# Patient Record
Sex: Male | Born: 1940 | Race: Black or African American | Hispanic: No | Smoking: Never smoker
Health system: Southern US, Community
[De-identification: ages and names within clinical notes are randomized; demographics above are authoritative.]

## PROBLEM LIST (undated history)

## (undated) DIAGNOSIS — I509 Heart failure, unspecified: Secondary | ICD-10-CM

## (undated) DIAGNOSIS — J189 Pneumonia, unspecified organism: Secondary | ICD-10-CM

## (undated) DIAGNOSIS — I639 Cerebral infarction, unspecified: Secondary | ICD-10-CM

## (undated) DIAGNOSIS — N390 Urinary tract infection, site not specified: Secondary | ICD-10-CM

## (undated) DIAGNOSIS — F0390 Unspecified dementia without behavioral disturbance: Secondary | ICD-10-CM

## (undated) DIAGNOSIS — E785 Hyperlipidemia, unspecified: Secondary | ICD-10-CM

## (undated) DIAGNOSIS — I10 Essential (primary) hypertension: Secondary | ICD-10-CM

## (undated) DIAGNOSIS — M6282 Rhabdomyolysis: Secondary | ICD-10-CM

## (undated) DIAGNOSIS — D472 Monoclonal gammopathy: Secondary | ICD-10-CM

## (undated) DIAGNOSIS — E049 Nontoxic goiter, unspecified: Secondary | ICD-10-CM

## (undated) DIAGNOSIS — I350 Nonrheumatic aortic (valve) stenosis: Secondary | ICD-10-CM

## (undated) HISTORY — PX: CIRCUMCISION: SUR203

---

## 2000-05-10 ENCOUNTER — Encounter: Payer: Self-pay | Admitting: Emergency Medicine

## 2000-05-10 ENCOUNTER — Encounter: Payer: Self-pay | Admitting: Family Medicine

## 2000-05-10 ENCOUNTER — Inpatient Hospital Stay (HOSPITAL_COMMUNITY): Admission: EM | Admit: 2000-05-10 | Discharge: 2000-05-18 | Payer: Self-pay | Admitting: Emergency Medicine

## 2000-05-11 ENCOUNTER — Encounter: Payer: Self-pay | Admitting: Pediatrics

## 2000-05-12 ENCOUNTER — Encounter: Payer: Self-pay | Admitting: Pediatrics

## 2000-05-18 ENCOUNTER — Inpatient Hospital Stay (HOSPITAL_COMMUNITY)
Admission: RE | Admit: 2000-05-18 | Discharge: 2000-06-02 | Payer: Self-pay | Admitting: Physical Medicine and Rehabilitation

## 2000-07-09 ENCOUNTER — Encounter: Admission: RE | Admit: 2000-07-09 | Discharge: 2000-08-25 | Payer: Self-pay | Admitting: Pediatrics

## 2000-09-25 ENCOUNTER — Encounter: Payer: Self-pay | Admitting: Emergency Medicine

## 2000-09-25 ENCOUNTER — Encounter: Admission: RE | Admit: 2000-09-25 | Discharge: 2000-09-25 | Payer: Self-pay | Admitting: Emergency Medicine

## 2008-05-19 ENCOUNTER — Encounter: Admission: RE | Admit: 2008-05-19 | Discharge: 2008-05-19 | Payer: Self-pay | Admitting: Nephrology

## 2011-02-26 ENCOUNTER — Emergency Department (HOSPITAL_COMMUNITY): Payer: Medicare Other

## 2011-02-26 ENCOUNTER — Inpatient Hospital Stay (HOSPITAL_COMMUNITY)
Admission: EM | Admit: 2011-02-26 | Discharge: 2011-03-01 | DRG: 558 | Disposition: A | Discharge status: Home / Self Care | Payer: Medicare Other | Attending: Internal Medicine | Admitting: Internal Medicine

## 2011-02-26 DIAGNOSIS — M6282 Rhabdomyolysis: Principal | ICD-10-CM | POA: Diagnosis present

## 2011-02-26 DIAGNOSIS — I359 Nonrheumatic aortic valve disorder, unspecified: Secondary | ICD-10-CM | POA: Diagnosis present

## 2011-02-26 DIAGNOSIS — W010XXA Fall on same level from slipping, tripping and stumbling without subsequent striking against object, initial encounter: Secondary | ICD-10-CM | POA: Diagnosis present

## 2011-02-26 DIAGNOSIS — R748 Abnormal levels of other serum enzymes: Secondary | ICD-10-CM | POA: Diagnosis present

## 2011-02-26 DIAGNOSIS — I10 Essential (primary) hypertension: Secondary | ICD-10-CM | POA: Diagnosis present

## 2011-02-26 DIAGNOSIS — R310 Gross hematuria: Secondary | ICD-10-CM | POA: Diagnosis not present

## 2011-02-26 DIAGNOSIS — D472 Monoclonal gammopathy: Secondary | ICD-10-CM | POA: Diagnosis present

## 2011-02-26 DIAGNOSIS — Z8673 Personal history of transient ischemic attack (TIA), and cerebral infarction without residual deficits: Secondary | ICD-10-CM

## 2011-02-26 DIAGNOSIS — I5030 Unspecified diastolic (congestive) heart failure: Secondary | ICD-10-CM | POA: Diagnosis present

## 2011-02-26 DIAGNOSIS — N390 Urinary tract infection, site not specified: Secondary | ICD-10-CM | POA: Diagnosis present

## 2011-02-26 DIAGNOSIS — R42 Dizziness and giddiness: Secondary | ICD-10-CM | POA: Diagnosis present

## 2011-02-26 DIAGNOSIS — I951 Orthostatic hypotension: Secondary | ICD-10-CM | POA: Diagnosis present

## 2011-02-26 DIAGNOSIS — Y92009 Unspecified place in unspecified non-institutional (private) residence as the place of occurrence of the external cause: Secondary | ICD-10-CM

## 2011-02-26 LAB — CBC
MCH: 28.4 pg (ref 26.0–34.0)
MCV: 87.9 fL (ref 78.0–100.0)
Platelets: 185 10*3/uL (ref 150–400)
RBC: 5.36 MIL/uL (ref 4.22–5.81)
RDW: 13.4 % (ref 11.5–15.5)
WBC: 7.2 10*3/uL (ref 4.0–10.5)

## 2011-02-26 LAB — DIFFERENTIAL
Basophils Relative: 0 % (ref 0–1)
Eosinophils Absolute: 0 10*3/uL (ref 0.0–0.7)
Eosinophils Relative: 1 % (ref 0–5)
Lymphs Abs: 1 10*3/uL (ref 0.7–4.0)
Neutrophils Relative %: 77 % (ref 43–77)

## 2011-02-26 LAB — BASIC METABOLIC PANEL
BUN: 31 mg/dL — ABNORMAL HIGH (ref 6–23)
Creatinine, Ser: 1.29 mg/dL (ref 0.4–1.5)
GFR calc Af Amer: >60 mL/min (ref >60)
GFR calc non Af Amer: 55 mL/min — ABNORMAL LOW (ref >60)
Potassium: 3.8 mEq/L (ref 3.5–5.1)

## 2011-02-26 LAB — URINALYSIS, ROUTINE W REFLEX MICROSCOPIC
Bilirubin Urine: NEGATIVE
Protein, ur: >300 mg/dL — AB
Urine Glucose, Fasting: NEGATIVE mg/dL
pH: 8 (ref 5.0–8.0)

## 2011-02-26 LAB — CK TOTAL AND CKMB (NOT AT ARMC)
CK, MB: 6.9 ng/mL (ref 0.3–4.0)
Total CK: 3836 U/L — ABNORMAL HIGH (ref 7–232)

## 2011-02-26 LAB — URINE MICROSCOPIC-ADD ON

## 2011-02-26 LAB — D-DIMER, QUANTITATIVE: D-Dimer, Quant: 2.62 ug/mL-FEU — ABNORMAL HIGH (ref 0.00–0.48)

## 2011-02-27 ENCOUNTER — Encounter (HOSPITAL_COMMUNITY): Payer: Self-pay

## 2011-02-27 DIAGNOSIS — R079 Chest pain, unspecified: Secondary | ICD-10-CM

## 2011-02-27 LAB — DIFFERENTIAL
Basophils Absolute: 0 10*3/uL (ref 0.0–0.1)
Basophils Relative: 0 % (ref 0–1)
Eosinophils Absolute: 0 10*3/uL (ref 0.0–0.7)
Eosinophils Relative: 0 % (ref 0–5)
Lymphs Abs: 1.2 10*3/uL (ref 0.7–4.0)

## 2011-02-27 LAB — CARDIAC PANEL(CRET KIN+CKTOT+MB+TROPI)
CK, MB: 4.1 ng/mL — ABNORMAL HIGH (ref 0.3–4.0)
Total CK: 3057 U/L — ABNORMAL HIGH (ref 7–232)

## 2011-02-27 LAB — COMPREHENSIVE METABOLIC PANEL
Albumin: 3.5 g/dL (ref 3.5–5.2)
BUN: 34 mg/dL — ABNORMAL HIGH (ref 6–23)
Chloride: 110 mEq/L (ref 96–112)
Creatinine, Ser: 1.3 mg/dL (ref 0.4–1.5)
GFR calc non Af Amer: 55 mL/min — ABNORMAL LOW (ref >60)
Total Bilirubin: 0.9 mg/dL (ref 0.3–1.2)

## 2011-02-27 LAB — CBC
MCV: 87.9 fL (ref 78.0–100.0)
Platelets: 166 10*3/uL (ref 150–400)
RDW: 13.6 % (ref 11.5–15.5)
WBC: 5.6 10*3/uL (ref 4.0–10.5)

## 2011-02-27 LAB — PROTIME-INR
INR: 1.07 (ref 0.00–1.49)
Prothrombin Time: 14.1 seconds (ref 11.6–15.2)
Prothrombin Time: 15.5 seconds — ABNORMAL HIGH (ref 11.6–15.2)

## 2011-02-27 LAB — RAPID URINE DRUG SCREEN, HOSP PERFORMED
Amphetamines: NOT DETECTED
Benzodiazepines: NOT DETECTED
Cocaine: NOT DETECTED
Opiates: NOT DETECTED
Tetrahydrocannabinol: NOT DETECTED

## 2011-02-27 LAB — APTT
aPTT: 25 seconds (ref 24–37)
aPTT: 30 seconds (ref 24–37)

## 2011-02-27 MED ORDER — IOHEXOL 300 MG/ML  SOLN
100.0000 mL | Freq: Once | INTRAMUSCULAR | Status: AC | PRN
  Administered 2011-02-27: 100 mL via INTRAVENOUS

## 2011-02-28 ENCOUNTER — Inpatient Hospital Stay (HOSPITAL_COMMUNITY): Payer: Medicare Other

## 2011-02-28 DIAGNOSIS — I517 Cardiomegaly: Secondary | ICD-10-CM

## 2011-02-28 DIAGNOSIS — R42 Dizziness and giddiness: Secondary | ICD-10-CM

## 2011-02-28 LAB — LIPID PANEL
Cholesterol: 134 mg/dL (ref 0–200)
HDL: 43 mg/dL (ref >39)
Total CHOL/HDL Ratio: 3.1 RATIO
Triglycerides: 52 mg/dL (ref <150)

## 2011-02-28 LAB — CBC
Hemoglobin: 11.6 g/dL — ABNORMAL LOW (ref 13.0–17.0)
Hemoglobin: 12.4 g/dL — ABNORMAL LOW (ref 13.0–17.0)
MCH: 28 pg (ref 26.0–34.0)
MCH: 28.8 pg (ref 26.0–34.0)
MCHC: 32.5 g/dL (ref 30.0–36.0)
MCV: 88 fL (ref 78.0–100.0)
MCV: 88.6 fL (ref 78.0–100.0)
RBC: 4.15 MIL/uL — ABNORMAL LOW (ref 4.22–5.81)
RBC: 4.3 MIL/uL (ref 4.22–5.81)

## 2011-02-28 LAB — URINALYSIS, ROUTINE W REFLEX MICROSCOPIC
Bilirubin Urine: NEGATIVE
Glucose, UA: NEGATIVE mg/dL
Specific Gravity, Urine: 1.023 (ref 1.005–1.030)
pH: 6 (ref 5.0–8.0)

## 2011-02-28 LAB — COMPREHENSIVE METABOLIC PANEL
BUN: 26 mg/dL — ABNORMAL HIGH (ref 6–23)
CO2: 26 mEq/L (ref 19–32)
Chloride: 106 mEq/L (ref 96–112)
Creatinine, Ser: 1.09 mg/dL (ref 0.4–1.5)
GFR calc non Af Amer: >60 mL/min (ref >60)
Total Bilirubin: 0.8 mg/dL (ref 0.3–1.2)

## 2011-02-28 LAB — URINE MICROSCOPIC-ADD ON

## 2011-02-28 LAB — TSH: TSH: 0.991 u[IU]/mL (ref 0.350–4.500)

## 2011-02-28 LAB — PROTIME-INR: Prothrombin Time: 14.6 seconds (ref 11.6–15.2)

## 2011-03-01 DIAGNOSIS — R55 Syncope and collapse: Secondary | ICD-10-CM

## 2011-03-01 LAB — BASIC METABOLIC PANEL
BUN: 19 mg/dL (ref 6–23)
Chloride: 105 mEq/L (ref 96–112)
GFR calc Af Amer: >60 mL/min (ref >60)
GFR calc non Af Amer: >60 mL/min (ref >60)
Potassium: 3.7 mEq/L (ref 3.5–5.1)
Sodium: 137 mEq/L (ref 135–145)

## 2011-03-01 LAB — CK: Total CK: 2304 U/L — ABNORMAL HIGH (ref 7–232)

## 2011-03-12 ENCOUNTER — Telehealth (INDEPENDENT_AMBULATORY_CARE_PROVIDER_SITE_OTHER): Payer: Self-pay | Admitting: *Deleted

## 2011-03-13 ENCOUNTER — Ambulatory Visit (HOSPITAL_COMMUNITY): Payer: Medicare Other | Attending: Internal Medicine

## 2011-03-13 ENCOUNTER — Encounter: Payer: Self-pay | Admitting: Internal Medicine

## 2011-03-13 DIAGNOSIS — I4949 Other premature depolarization: Secondary | ICD-10-CM

## 2011-03-13 DIAGNOSIS — R55 Syncope and collapse: Secondary | ICD-10-CM | POA: Insufficient documentation

## 2011-03-18 NOTE — Assessment & Plan Note (Signed)
Summary: Cardiology Nuclear Testing  Nuclear Med Background Indications for Stress Test: Evaluation for Ischemia, Post Hospital  Indications Comments: 03/01/11 CP: ^ troponin, syncope  History: Echo  History Comments: 3/12 Echo: EF= 60-65%, mod AS, mod LVH  Symptoms: Chest Pain with Exertion, Dizziness, DOE, Palpitations, Syncope    Nuclear Pre-Procedure Cardiac Risk Factors: CVA, Hypertension Caffeine/Decaff Intake: None NPO After: 5:00 PM Lungs: clear IV 0.9% NS with Angio Cath: 22g     IV Site: R Wrist IV Started by: Bonnita Levan, RN Chest Size (in) 44     Height (in): 65 Weight (lb): 154 BMI: 25.72  Nuclear Med Study 1 or 2 day study:  1 day     Stress Test Type:  Lexiscan Reading MD:  Dietrich Pates, MD     Referring MD:  T.Brackbill Resting Radionuclide:  Technetium 1m Tetrofosmin     Resting Radionuclide Dose:  10.5 mCi  Stress Radionuclide:  Technetium 66m Tetrofosmin     Stress Radionuclide Dose:  33 mCi   Stress Protocol  Max Systolic BP: 177 mm Hg Lexiscan: 0.4 mg   Stress Test Technologist:  Milana Na, EMT-P     Nuclear Technologist:  Doyne Keel, CNMT  Rest Procedure  Myocardial perfusion imaging was performed at rest 45 minutes following the intravenous administration of Technetium 67m Tetrofosmin.  Stress Procedure  The patient received IV Lexiscan 0.4 mg over 15-seconds.  Technetium 35m Tetrofosmin injected at 30-seconds.  There were no significant changes, and freq pvcs with infusion.  Quantitative spect images were obtained after a 45 minute delay.  QPS Raw Data Images:  Stress images were moton corrected.  SOft tissue (diaphragm, bowel activity) underlie heart. Stress Images:  Normal homogeneous uptake in all areas of the myocardium. Rest Images:  Normal homogeneous uptake in all areas of the myocardium. Subtraction (SDS):  No evidence of ischemia. Transient Ischemic Dilatation:  1.04  (Normal <1.22)  Lung/Heart Ratio:  .33  (Normal  <0.45)  Quantitative Gated Spect Images QGS cine images:  non-gated study   Overall Impression  Exercise Capacity: Lexiscan with no exercise. BP Response: Normal blood pressure response. Clinical Symptoms: No chest pain ECG Impression: Isolated PVCs through study.  EKG was nondiagnostic due to baseline changes. Overall Impression: Normal stress nuclear study.

## 2011-03-18 NOTE — Progress Notes (Signed)
Summary: Nuclear Pre-Procedure  Phone Note Outgoing Call Call back at [facility] Phone [phone]   Call placed by: Stanton Kidney, EMT-P,  March 12, 2011 2:15 PM Action Taken: Phone Call Completed Summary of Call: Left message with information on Myoview Information Sheet (see scanned document for details). Stanton Kidney, EMT-P  March 12, 2011 2:15 PM      Nuclear Med Background Indications for Stress Test: Evaluation for Ischemia, Post Hospital  Indications Comments: 03/01/11 CP: ^ troponin, syncope  History: Echo  History Comments: 3/12 Echo: EF= 60-65%, mod AS, mod LVH  Symptoms: Chest Pain with Exertion, Dizziness, Syncope    Nuclear Pre-Procedure Cardiac Risk Factors: CVA, Hypertension

## 2011-04-05 NOTE — Discharge Summary (Signed)
NAME:  Bradley [name], Bradley [name]                 ACCOUNT NO.:  [id number]  MEDICAL RECORD NO.:  [id number]           PATIENT TYPE:  I  LOCATION:  1424                         FACILITY[facility]  PHYSICIAN:  Richarda Overlie, MD       DATE OF BIRTH:  August 01, 1941  DATE OF ADMISSION:  02/26/2011 DATE OF DISCHARGE:  03/01/2011                        DISCHARGE SUMMARY - REFERRING   PRIMARY CARE PHYSICIAN:  Dr. Clarene Duke.  CHIEF COMPLAINT:  Dizziness.  DISCHARGE DIAGNOSES: 1. Rhabdomyolysis. 2. Dizziness likely secondary to orthostatic hypotension in the     setting of his rhabdomyolysis. 3. Elevated cardiac enzymes most likely secondary to rhabdomyolysis 4. Moderate aortic stenosis. 5. Diastolic heart failure per echocardiogram. 6. Speckled appearance of the myocardium to be evaluated further by     Cardiology. 7. Urinary tract infection. 8. Hypertension. 9. Monoclonal gammopathies of undetermined significance. 10.History of thyroid goiter. 11.Prior history of cerebrovascular accident, ruled out for acute     cerebrovascular accident. 12.Hematuria of unclear etiology.  PROCEDURES: 1. CT of the head without contrast shows chronic ischemic changes.  No     acute intracranial abnormality. 2. CT angio of the chest shows no evidence of acute pulmonary     thromboembolism.  There is a large mass in the thoracic inlet,     likely thyromegaly. 3. Ultrasound of the kidneys shows bilateral echogenic kidneys     suggesting medical renal disease with bilateral renal cysts.  CONSULTATIONS:  By Pricilla Riffle, MD, [facility] because of elevated troponin.  SUBJECTIVE:  This is a 69 year old male with a history of coronary artery disease, presented to the ED with complaint of chest pain.  The patient was getting dressed to go to the [facility] and was found on the floor by the family for an undetermined amount of time.  The patient also complained of 2/10 chest pain without any associated symptoms.  The patient's  duration of symptoms as far as the chest pain goes have been unclear.  He does have a history of CVAs and is on Plavix at home.  The patient was admitted for further evaluation.  HOSPITAL COURSE: 1. Fall, syncope and chest pain.  The patient was evaluated by     Cardiology.  His cardiac enzymes were cycled, and his troponin was     found to be elevated at 0.10.  Per Dr. Tenny Craw and Dr. Margaretha Seeds as     well, the patient had evidence of aortic stenosis and a 2-D echo     confirmed moderate aortic stenosis.  He also wanted an outpatient     Myoview study which will be arranged for by [facility].  At     this time, the patient is requesting to go home and does not want     to stay in the hospital for his inpatient Myoview which was     recommended by Cardiology.  The patient's daughter is also     supportive of his decision.  I have notified Cardiology to see the     patient prior to discharge and schedule him as outpatient stress  test based on the results of the echo.  The patient was also placed     on telemetry, and he was not found to have evidence of any     arrhythmias. 2. Rhabdomyolysis.  The patient was on the floor for an unknown period     of time and developed rhabdomyolysis with a peak CK of 3836.  With     IV hydration, his CK did improve to 2304.  The patient's Lasix has     been placed on hold, and he has been advised to hydrate himself     aggressively.  His statin has also been placed on hold.  He will     need a repeat CK in about 2 weeks; and if the CK is within the     normal range, then statin can be restarted. 3. Urinary tract infection.  The patient was found to have a urinary     tract infection which could also be contributing to his dizziness     and orthostasis.  He was started on Rocephin in the hospital and     has been switched to ciprofloxacin which he will continue for     another 10 days. 4. Hematuria.  The patient developed gross hematuria while  he was in     the hospital.  His Plavix was put on hold.  The patient has been     advised to hold off on aspirin and Plavix until he is seen by     Urology.  Phone number for [facility] and a referral has been     provided. 5. Hypertension.  The patient will continue his outpatient     antihypertensive medications.  DISPOSITION:  Since the patient is anxious to go home, we will request Cardiology to see him today and if okay with them, the patient will be discharged home today.  DISCHARGE MEDICATIONS: 1. Ciprofloxacin 500 p.o. daily for 10 days. 2. Norvasc 10 mg p.o. daily. 3. Avapro 300 mg p.o. daily. 4. Clonidine patch transdermal daily. 5. Hydralazine 50 mg p.o. twice daily. 6. Labetalol 600 mg p.o. twice daily.     Richarda Overlie, MD     NA/MEDQ  D:  03/01/2011  T:  03/01/2011  Job:  [id number]  cc:   [facility]  Dr. Nelma Rothman Cardiology  Electronically Signed by Richarda Overlie MD on 04/05/2011 08:18:11 PM

## 2011-04-10 NOTE — H&P (Signed)
NAME:  Bradley [name], Bradley [name]                 ACCOUNT NO.:  [id number]  MEDICAL RECORD NO.:  [id number]           PATIENT TYPE:  E  LOCATION:  WLED                         FACILITY[facility]  [facility]  PHYSICIAN:  Massie Maroon, MD        DATE OF BIRTH:  Mar 01, 1941  DATE OF ADMISSION:  02/26/2011 DATE OF DISCHARGE:                             HISTORY & PHYSICAL   CHIEF COMPLAINT:  Dizziness.  HISTORY OF PRESENT ILLNESS:  This is a 70 year old male with a history of hypertension and MGUS.  He complains of dizziness and laying himself down on to the floor.  The patient experienced this episode at about 8 a.m.  It was associated with some chest tightness that lasted about half an hour duration.  The patient denies any fever, chills, cough, nausea, vomiting, palpitations, or shortness of breath.  The patient was brought to the ED.  He had a CT brain, noncontrast, that showed no acute intracranial pathology.  The patient did not have orthostatic blood pressures.  He was mildly hypertensive when he hit the ED with a blood pressure of 168/89.  EKG showed normal sinus rhythm at 70 with normal axis, T-wave inversion at 1, AVL, V4-V6 which appears to be old.  He also had evidence of LVH and left atrial enlargement.  There were Q- waves in V1 and V2.  There was ST elevation in V1-V2 on prior EKG that is a little bit less on today's EKG.  The patient has a positive troponin of 0.10.  Second set of cardiac markers is pending.  The patient will be admitted for near-syncope workup.  Of note, the patient did have UTI.  PAST MEDICAL HISTORY: 1. Hypertension. 2. Hyperlipidemia 3. MGUS 4. Thyroid goiter. 5. 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  PAST SURGICAL HISTORY:  None.  SOCIAL HISTORY:  The patient was born in [place], [place].  He has 1 daughter who graduated from [employer].  He does not smoke or drink.  He is a retired Haematologist.  FAMILY HISTORY:  Positive family  history of stroke.  His father died at age 43 of old age and had a history of prior CVA.  Mother died in her 47s of old age.  ALLERGIES:  No known drug allergies.  MEDICATIONS:  Plavix, Avapro, furosemide, clonidine, labetalol, Lipitor and hydralazine (dose is unknown).  REVIEW OF SYSTEMS:  Negative for all 10 organ systems except for pertinent positives stated above.  PHYSICAL EXAMINATION:  VITAL SIGNS:  Temperature 98.1, pulse 68, blood pressure is 168/89, pulse oximetry is 96% on room air. HEENT:  Anicteric.  Positive arcus senilis.  Pupils 1.5 mm, symmetric. Direct consensual near reflexes intact.  Mucous membranes moist. NECK:  No JVD, bruits, thyromegaly, or adenopathy. HEART:  Regular rate and rhythm.  S1 and S2.  2/6 systolic ejection murmur at the right upper sternal border/apex. LUNGS:  Clear to auscultation bilaterally. ABDOMEN:  Soft, flat, nontender, nondistended.  Positive bowel sounds. EXTREMITIES:  No cyanosis, clubbing or edema. SKIN:  Onychomycosis. LYMPH NODES:  No adenopathy. MUSCULOSKELETAL:  Slight decrease  in right grip strength, slight expressive aphasia.  Reflexes are 2+, symmetric, diffuse with equivocal to upgoing toe on the right and downgoing toe on the left.  LABORATORY DATA:  WBC 7.2, hemoglobin 15.2, platelet count 185.  Sodium 142, potassium 3.8, BUN 31, creatinine 1.29, calcium 10.0, CPK 3833, CK- MB 6.9,  relative index 0.2, troponin-I is 0.10.  Urinalysis, protein greater than 800.  Urine microscopic, WBC 77, RBC 36, bacteria many.  ASSESSMENT: 1. Near syncope. 2. Rhabdomyolysis. 3. Dehydration. 4. Urinary tract infection. 5. Hypertension. 6. Hyperlipidemia. 7. Cerebrovascular accident (bilateral lacunar infarcts on CT brain,     May 11, 2000) 8. Monoclonal gammopathy of unknown significance. 9. Thyroid goiter. 10.Chest pain.  PLAN:  The patient will be placed on telemetry.  Will check CK, CK-MB, troponin-I q.6 hours x3 sets.  We will  check a carotid ultrasound and cardiac 2-D echo in light of cardiac murmur to rule out aortic stenosis. We will check a D-dimer and if positive, obtain CTA of the chest.  We will check orthostatic blood pressures.  Blood pressure appears somewhat uncontrolled and we will check serial blood pressures.  We will use hydralazine 10 mg p.r.n. systolic blood pressure greater than 160 in addition to already present blood pressure medications.  We will stop Lipitor due to rhabdomyolysis.  In terms of urinary tract infection, we will use ceftriaxone 1 g IV daily.  For DVT prophylaxis, SCDs.     Massie Maroon, MD     JYK/MEDQ  D:  02/26/2011  T:  02/27/2011  Job:  [id number]  cc:   Caryn Bee L. Little, M.D. Fax: [phone]  Electronically Signed by Pearson Grippe MD on 04/10/2011 12:51:12 AM

## 2011-04-10 NOTE — H&P (Signed)
  NAME:  Bradley [name], Bradley [name]                 ACCOUNT NO.:  [id number]  MEDICAL RECORD NO.:  [id number]           PATIENT TYPE:  E  LOCATION:  WLED                         FACILITY:  [facility]  PHYSICIAN:  Massie Maroon, MD        DATE OF BIRTH:  [birth year]  DATE OF ADMISSION:  02/26/2011 DATE OF DISCHARGE:                             HISTORY & PHYSICAL   ADDENDUM:  Initial cardiac 2-D echo was negative for aortic stenosis, consider stress test.  For rhabdomyolysis, hydrate aggressively with normal saline IV.     Massie Maroon, MD     JYK/MEDQ  D:  02/26/2011  T:  02/26/2011  Job:  [id number]  Electronically Signed by Pearson Grippe MD on 04/10/2011 12:51:01 AM

## 2011-04-19 NOTE — Consult Note (Signed)
NAME:  Bradley [name], Bradley [name]                 ACCOUNT NO.:  [id number]  MEDICAL RECORD NO.:  [id number]           PATIENT TYPE:  I  LOCATION:  1424                         [facility]ACILITY:  [facility]  PHYSICIAN:  Pricilla Riffle, MD, FACCDATE OF BIRTH:  03-Nov-1941  DATE OF CONSULTATION:  02/27/2011 DATE OF DISCHARGE:                                CONSULTATION   PRIMARY CARE PHYSICIAN:  Dr. Clarene Duke.  PRIMARY CARDIOLOGIST:  None.  CHIEF COMPLAINT:  Chest pain.  HISTORY OF PRESENT ILLNESS:  Bradley [name] is a 70 year old male with no history of coronary artery disease.  He came into the hospital with chest pain among other symptoms and Cardiology was asked to evaluate him.  [name] states that he had chest pain yesterday.  It started with some moderate activity as he says he was getting dressed to go to the [employer].  This is moderate activity for him.  He had a feeling that his body did not work right.  He was found on the floor by family an undetermined amount of time later.  The patient cannot remember exactly what time he was getting ready to go to the [employer].  At first, he said he got into the floor, but then later admitted that he fell.  He had 2/10 chest pain.  There were no associated symptoms.  It did not radiate.  He says it "just hurt." He does not think he has ever had it before.  The duration is unclear, but he is currently pain-free. With a CVA history, his activity was significantly limited prior to admission, but with moving around the house and exercising at the [employer], he has no history of exertional chest pain.  His daughter is present and concurs with this information.  PAST MEDICAL HISTORY: 1. Remote history of CVA in 2001 as well as other more recent CVAs per     the patient. 2. Hypertension. 3. History of obesity. 4. History of noncompliance with medications, which the patient admits     he still does. 5. Per bone scan report in 2009, he has a history of  monoclonal     gammopathy of uncertain origin.  PAST SURGICAL HISTORY:  Circumcision.  ALLERGIES:  No known drug allergies.  CURRENT MEDICATIONS: 1. Norvasc 10 mg a day. 2. Rocephin daily. 3. Clonidine patch 0.3 mg weekly. 4. Plavix 75 mg a day. 5. Hydralazine 50 mg b.i.d. 6. Labetalol 600 mg b.i.d. 7. Normal saline for hydration.  SOCIAL HISTORY:  He lives in [place] with family nearby.  He is retired from Nature conservation officer.  He denies alcohol, tobacco, or drug use.  FAMILY HISTORY:  His father died at 12 with stroke, but no heart disease.  Neither, his mother nor siblings have any history of coronary artery disease.  REVIEW OF SYSTEMS:  He has chronic right-sided weakness and ambulates poorly around the house using a cane.  He admits he does not always use the cane when he ambulates, although he has poor balance.  He denies frequent falls.  He denies fever, chills, or sweats.  He has not had  any recent cough or cold symptoms.  He never gets palpitations and denies lower extremity edema.  He denies melena.  Full 14-point review of systems is otherwise negative except as stated in the HPI.  PHYSICAL EXAMINATION:  VITAL SIGNS:  Temperature is 98.1, blood pressure 168/89, heart rate 68, respiratory rate 20, O2 saturation 96% on room air. GENERAL:  He is a well-developed elderly African American male in no acute distress. HEENT:  Normal with the exception of a possible slight right facial droop. NECK:  There is no JVD noted and he has significant thyromegaly, especially on the left.  He has no bruits noted.  There is a radiation of his murmur. CV:  His heart is regular in rate and rhythm with a 3/6 mid-peaking systolic murmur noted at the base. LUNGS:  He has a few rales in the bases. ABDOMEN:  Soft and nontender with active bowel sounds. SKIN:  He has multiple chronic lesions that he states he has had for years and have not changed recently. MUSCULOSKELETAL:   There is no joint deformity or effusions and no spine or CVA tenderness. EXTREMITIES:  There is no cyanosis, clubbing, or edema noted and distal pulses are intact in all 4 extremities.  NEURO:  He is alert and oriented with chronic right-sided weakness noted.  IMAGING:  CT of the head, no acute changes, although there are chronic changes and left occipital lobe encephalomalacia seen, but has a chronic appearance.  CT of the chest is negative for PE and there is no abnormal mediastinal adenopathy.  There is a large mass at the thoracic inlet causing deviation of the trachea to the right, likely goiter.  LABORATORY VALUES:  Hemoglobin 13.2, hematocrit 40.7, WBCs 5.6, platelets 166, INR 1.21.  Sodium 144, potassium 4.0, chloride 110, CO2 of 26, BUN 34, creatinine 1.3, glucose 120.  CK-MB #1, 3836/6.9, then 3705/5.8, then 3057/4.1, then 2736/3.5.  Troponin I 0.10, then 0.12, then 0.09, 0.10.  Urine drug screen negative.  Urinalysis shows a specific gravity of 1.017 with trace ketones, small amount of blood greater than 300 protein, 0.2 durable antigen, 7-10 WBCs, 3-6 RBCs, and many bacteria.  EKG is sinus rhythm with a first-degree AV block and improved LVH/repolarization changes from 2001, although he still has Q waves in V1 through V3.  IMPRESSION:  Bradley [name] was seen today by Dr. Dietrich Pates, the patient evaluated and the data reviewed.  He is a 70 year old male with a history of chest pain this a.m. while rushing to get to the gym and fell.  He was found down and he has never had chest pain before now. He is currently pain-free.  His labs are consistent with a skeletal muscle release.  The troponin has a minimal increase.  Per Dr. Elmyra Ricks note, an initial cardiac 2-D echo was negative for aortic stenosis, but this was likely a bedside echo and a full echo needs to be done.  We will order this to evaluate his valves and left ventricular function.  He has been appropriately admitted  to Telemetry.  If his EF is normal probable Myoview, possibly as an inpatient since he may have compliance issues.  If his EF is depressed, cardiac catheterization is indicated.  We will continue to follow him with you.     Theodore Demark, PA-C   ______________________________ Pricilla Riffle, MD, [facility]    RB/MEDQ  D:  02/27/2011  T:  02/28/2011  Job:  [id number]  Electronically Signed by Theodore Demark PA-C on  03/17/2011 06:33:51 AM Electronically Signed by Dietrich Pates MD FACC on 04/19/2011 10:08:12 PM

## 2011-05-16 NOTE — Discharge Summary (Signed)
[place]  Patient:    Bradley [name], Bradley [name]                        MRN: [id number] Adm. Date:  [id number] Disc. Date: 06/02/00 Attending:  Evern Core Dictator:   Mcarthur Rossetti. Angiulli, P.A. CC:         Laurier Nancy, M.D.             Reuben Likes, M.D.             Deanna Artis. Sharene Skeans, M.D.                           Discharge Summary  DISCHARGE DIAGNOSES: 1. Bilateral lacunar infarction with posterior circulation. 2. Hypertension. 3. Mild obesity.  HISTORY OF PRESENT ILLNESS:  [age]-year-old black male, history of hypertension, admitted to [facility] 10, 2000, with unsteady gait and dizziness.  The patient had recently run out of his blood pressure medications over the past 10 days.  Did not refill those due to cost.  Upon evaluation, blood pressure was 212/120.  CT scan showed multiple bilateral lacunar infarctions without hemorrhage.  MRI and MRA with advanced chronic changes.  Remote left PCA distribution infarction.  Placed on labetalol, and blood pressure with improved control.  Cardiology, Dr. Daleen Squibb, consulted for elevated creatine kinase.  MB fraction was low, showing no infarction. Echocardiogram without thrombus.  Ejection fraction 60%.  Carotid duplex without internal carotid artery stenosis.  Neurology consult, Dr. Sharene Skeans, placed on intravenous heparin, aspirin, and Plavix therapy.  Ambulating moderate assist, 80 feet with rolling walker.  No chest pain or shortness of breath.  Latest chemistries unremarkable.  He is admitted for a comprehensive rehabilitation program.  PAST MEDICAL HISTORY:  Hypertension.  ALLERGIES:  None.  TOBACCO/ALCOHOL:  None.  PRIMARY M.D.:  Dr. Leslee Home.  MEDICATIONS PRIOR TO ADMISSION:   Atenolol questionable dose. Hydrochlorothiazide.  Noted the patient had recently quit taking his blood pressure medications due to cost and also questionable  compliance.  SOCIAL HISTORY:  Lives in [place] with his nephew.  He is separated from his wife, who is still supportive.  He is self-employed.  One-level home with four steps to entry.  HOSPITAL COURSE:  The patient did well while on rehabilitation services with therapies initiated on a b.i.d. basis.  The following issues were followed during the patients rehabilitation course.  Pertaining to [name] bilateral lacunar infarctions, remained stable.  Maintained on aspirin and Plavix therapy.  His strength was grossly graded at 4- to 4/5.  Some decreased coordination that greatly improved.  He was now minimal assist for ambulation with a rolling walker.  Supervision to minimal assist with activities of daily living.  He would follow up with neurology services.  The patient was advised of no driving.  He had no swallowing difficulties.  Blood pressures remained monitored.  He did have some modest increased variables.  No headache or dizziness.  Simplicity was quite needed due to cost efficiency and the patients compliance with medications.  Thus, his Procardia XL was discontinued on May 28, 2000.  His clonidine was increased to 0.3 mg.  He would continue on his Lasix which had been decreased to 20 mg, and continue on his labetalol.  It was heavily stressed the need to remain compliant with these medications.  This was also discussed with his wife.  He had no  bowel or bladder disturbances.  Latest laboratories showed a sodium of 136, potassium 4.0, BUN 22, creatinine 1.0.  He did have some mild renal insufficiency with a BUN of 37 and, at that time, his Lasix had been decreased to 20 mg.  Encouragement of fluids was noted.  DISCHARGE MEDICATIONS: 1. Lasix 20 mg daily. 2. Potassium chloride 10 mEq daily. 3. Plavix 75 mg daily. 4. Labetalol 400 mg twice daily. 5. Catapres 0.3 mg twice daily. 6. Aspirin 325 mg daily.  ACTIVITY:  As tolerated.  Supervision for safety.  DIET:   Regular.  WOUND CARE:  Not applicable.  SPECIAL INSTRUCTIONS:  No driving.  Advised no smoking, no alcohol.  FOLLOW-UP:  With primary M.D. for ongoing medical management of hypertension, being Dr. Leslee Home. DD:  05/28/00 TD:  06/01/00 Job: [id number]

## 2011-05-16 NOTE — Discharge Summary (Signed)
[facility]  Patient:    Bradley [name], Bradley [name]                        MRN: [id number] Adm. Date:  [id number] Disc. Date: 05/18/00 Attending:  Evern Core CC:         Jesse Sans. Wall, M.D. [facility]             Deanna Artis. Sharene Skeans, M.D.             [facility]                           Discharge Summary  SUMMARY OF HISTORY AND PHYSICAL:  The patient is a 70 year old male who was brought to the emergency room by his sister because he complained of feeling light-headed and unsteady in his gait the day prior to admission.  He was seen in the emergency room by the emergency room physician and held there all day, with his blood pressure in the severely elevated range at 212/120 and higher. He was given Lasix along with clonidine and atenolol.  He had been out of his regular medications for 10 days prior to admission.  His CT scan showed some lacunar infarcts and he was admitted for further evaluation and treatment.  PHYSICAL EXAMINATION:  Physical examination revealed a pulse of 80, respirations 18, blood pressure was 200/90.  SKIN:  His skin was cool and dry with good turgor.  Pupils were equal and reactive to light.  The fundi showed some AV nicking and narrowing.  There were no hemorrhages.  NECK:  Neck showed no bruits.  CARDIAC:  Rhythm was regular with no murmur.  The PMI was 2 cm to the left of the midclavicular line.  LUNGS:  He had no rales and no edema. ABDOMEN:  Soft, without bruits, organomegaly or masses.  NEUROLOGIC:  He was alert and able to speak. He moved all extremities well.  His strength was good.  ADMITTING IMPRESSION: 1. Severe hypertension. 2. Rule out ischemic heart disease. 3. Poor hypertension medication compliance.  SUMMARY OF LABORATORY AND X-RAY DATA:  Hemoglobin on admission was 15.4, white count 7800.  His CMET showed a sodium of 138, potassium 3.9, glucose 121, BUN 10, creatinine 0.9.  His CPK was elevated at 760  with an MB of 3.8.  His troponin was mildly elevated at 0.10.  A urine sodium was 130.  Urine potassium was 10.  A UA showed a specific gravity of 1.024, large hemoglobin, trace ketones, a large amount of protein, 0-5 wbcs, 0-5 rbcs and many bacteria.  A 2-D echocardiogram showed an ejection fraction of 60% with moderate LVH and aortic valve thickening and calcification but normal movement.  He also had mild AS and mild AI.  Carotid Dopplers showed mildly calcified plaque at the bifurcation and the ICA but no evidence of ICA stenosis, right and left, and antegrade vertebral flow.  His EKG on admission showed normal sinus rhythm, left ventricular hypertrophy, ST and T wave abnormalities possibly due to inferior and anterolateral ischemia.  A CT scan of the head on the day of admission revealed multiple bilateral lacunar infarcts.  His chest x-ray showed cardiomegaly with vascular congestion.  A repeat CT of the head on May 11, 2000 showed multiple lacunar infarcts but no intracranial hemorrhage.  HOSPITAL COURSE:  The patient was admitted to the medical intensive care unit. He was  placed at bedrest, given a 2 g sodium diet, begun on clonidine 0.1 mg b.i.d., atenolol 25 mg every day.  A Foley catheter was inserted.  He was seen in consultation by Dr. Jesse Sans. Wall, M.D. for cardiology, who started him on a labetalol drip because of his extremely high blood pressure.  The following morning, he denied any neurological symptoms with the exception he had a little bit of difficulty focusing his eyes.  His neurological examination shows him to be alert and oriented.  His speech was clear.  There was no obvious extremity weakness.  His blood pressure was 160/100.  Neurology consult was obtained with Dr. Deanna Artis. Hickling who felt that he had possibly had a brain stem versus cerebellar infarct, uncontrolled hypertension, multiple small vessel strokes at different ages, organic  brain syndrome and a poor social situation.  He was also begun on heparin.  On May 11, 2000, he had an episode of responsiveness.  This was associated with a drop in his blood pressure down to 130/68, which was somewhat low for him. His blood pressure came back up after his medications were held and his mental status improved.  His urine output remained somewhat low and he was given some IV furosemide and p.o. Procardia was started for better blood pressure control.  As of May 12, 2000, he still complained of some difficulty focusing his vision.  There were no further episodes of unresponsiveness.  His blood pressure was down to 140/70.  On physical examination, a mild right central seventh nerve palsy was noted and some nystagmus with lateral gaze bilaterally.  He was switched to p.o. labetalol as of that date.  His vision gradually improved, his blood pressure remained stable, his urine output did pick up a little bit.  He had no new neurological symptoms.  Blood pressure tended to be up and down. His Normodyne was increased and a rehab consult was obtained.  An MRI revealed diffuse intracranial atherosclerotic disease and the MRI showed multiple lacunar infarcts.  He was switched from heparin to Plavix and transferred to the regular floor.  The rehab consult recommended he either transfer home with home rehab or to the rehab floor.  Initially, his sister wanted to take him home; therefore, plans were made to have him sent home, however, the day the patient was about to go, he did not think he could return home since there would be no one home during the day to take care of him and he really has a great deal of difficulty ambulating.  Plans for made for transfer to a rehab bed.  Over the next several days, his blood pressure and neurological status remained stable.  A rehab bed was found to be available on May 18, 2000.  As of that date, he was alert, he had no complaints, he was  ambulatory in his  room, his blood pressure was 156/85, his lungs were clear, his cardiac rhythm was regular with no murmur, he was alert and oriented, his speech was clear, there was no extremity weakness, he had a slight droop of the right corner of his mouth and full EOMs with slight nystagmus on lateral gaze bilaterally.  It was felt he could be transferred to the rehab unit as of that date.  FINAL DIAGNOSES: 1. Cerebrovascular accident. 2. Hypertensive encephalopathy. 3. Malignant hypertension. 4. Congestive heart failure. 5. Medication noncompliance. 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  MEDICATIONS AT TIME OF DISCHARGE:  1. Clonidine 0.2 mg b.i.d. 2. Procardia XL 30 mg once daily. 3. Labetalol 400 mg b.i.d. 4. Furosemide 40 mg once a day. 5. K-Dur 20 mEq once a day. 6. Coated aspirin one per day. 7. Plavix 75 mg once a day. DD:  05/23/00 TD:  05/25/00 Job: [id number]

## 2011-05-16 NOTE — H&P (Signed)
[place]  Patient:    Bradley [name], Bradley [name]                        MRN: [id number] Adm. Date:  [id number] Attending:  Roque Lias CC:         Vale Haven. Andrey Campanile, M.D.                         History and Physical  PRESENTING HISTORY:  This 70 year old black male was brought to the emergency room by his sister because he had been somewhat light headed and unsteady in the his gait the day prior of admission.  He was seen in the emergency room by the emergency room physician and held all day with his blood pressure in a severely elevated range of 212/120 and higher.  He was given Lasix along with clonidine and atenolol.  He had been out of his medications regularly for over 10 days prior to this admission.  CT scan showed some old and new lacunar infarcts and he was admitted thereon.  ALLERGIES:  None.  MEDICATIONS:  Normal medications: 1. Atenolol 25 q.d. 2. HCTZ 50 q.d.  PAST MEDICAL HISTORY:  Hospitalizations/operations, circumcision.  SOCIAL HISTORY:  Denies alcohol or tobacco.  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  He is married.  REVIEW OF SYSTEMS:  The patient denies any health problems with the exception as listed above and the fact that he has had hypertension for over 20 years. The question of compliance is certainly an issue.  He has not had MI, CVA, TIA, diabetes, or other complicating diseases.  PHYSICAL EXAMINATION:  VITAL SIGNS:  Physical examination on admission, pulse 80, respirations 18, blood pressure 200/90.  SKIN:  Cool and dry with good turgor.  No acute rash.  HEENT:  ENT:  Tympanic membranes clear.  Pupils equal and reactive to light. The fundi show AV nicking and narrowing.  I do not see hemorrhages.  NECK:  Without bruits.  CARDIORESPIRATORY:  No murmurs heard.  PMI 2 cm left to mid clavicular line. No rales are appreciated and there is no edema.  ABDOMEN:  Soft without bruits,  organomegaly.  No masses.  RECTAL/GU:  Deferred.  NEUROLOGICAL/MUSCULOSKELETAL:  Alert, conversant, pleasant.  Moves all extremities.  Strength is good.  Did not gait test.  However, he had no symptoms or complaints of rigor or lightheadedness at that time.  IMPRESSION ON ADMISSION: 1. Severe hypertension. 2. Rule out cardiovascular ischemia. 3. Poor hypertension and medicine compliance.  PLAN:  See orders. DD:  05/10/00 TD:  05/11/00 Job: [id number]

## 2011-05-16 NOTE — Consult Note (Signed)
[facility]  Patient:    Bradley [name], Bradley [name]                        MRN: [id number] Proc. Date: 05/11/00 Adm. Date:  [id number] Attending:  Roque [name] CC:         Bradley [name], M.D.                          Consultation Report  DATE OF BIRTH:  [birth year]  CHIEF COMPLAINT:  Dizziness while I was walking. I cant see right.  Bradley [name] is a 70 year old African-American divorced gentleman who was seen in the [facility] on the evening of May 13 with the above chief complaint.  He was evaluated by the emergency room physicians and then by Bradley [name]. They determined that he had malignant unstable hypertension, history of multiple bicerebral strokes both chronic and subacute. On the basis of this, the patient was admitted to the hospital for acute treatment of his hypertension, for evaluation of his strokes, and for the determination of long-term stroke prevention and blood pressure control. I was asked to see him as a neurologist to give my opinion concerning these issues.  HISTORY OF PRESENT ILLNESS:  The patient has had a number of strokes in the past. Because he is somewhat confused and unable to give a good history, I cant determine whether he is aware of having had these in the past. He has not been seen by members of our office. He has a longstanding history of uncontrolled blood pressure largely because of poor compliance. In part, his poor compliance is related to difficulty affording medication and difficulty organizing himself to get medication on a timely basis so that he does not go without.  The patient recently ran out of medication and did not refill his prescription. On evaluation in the [facility] Room, blood pressures were in the 220 to 230/12o to 150 range. The patient was treated with both alpha and beta blocker drugs and then finally with a labetalol drip. His blood pressure has been  variable but gradually declining. With that, the patient appeared to become stronger although he remained lethargic and is unable to walk. He still complains of problems with his vision.  In addition, the patient had elevated creatinine kinase which lead to a cardiology consult with Dr. Juanito [name]. He noted the patient had severe hypertension, prior rain strokes and that the MB fraction was low suggesting this was not an infarction. He suggested appropriate workup for the patients brain disease and also appropriate treatment for his hypertension. He recommended changes in any platelet therapy, labetalol drip, carotid Doppler, echocardiogram as well as neuro consult which was ordered today.  PAST MEDICAL HISTORY:  Negative for other acute problems except as noted above.  PAST SURGICAL HISTORY:  Circumcision.  REVIEW OF SYSTEMS:  The patient has not had fever, any recurrent infections in the head, neck, lungs, GI, GU, melena, anemia, weight loss, bruising. The patient has had problems with his vision but it seems to be worse at this time. Review of systems is otherwise negative.  CURRENT MEDICATIONS:  None. The patient had been on atenolol 25 mg and clonidine but was off these medications.  ALLERGIES:  No known drug allergies.  SOCIAL HISTORY:  The patient is divorced. He does not use tobacco nor does he smoke cigarettes. He lives alone.  People confining him to the hospital included Bradley [name] (sister) telephone [phone] and Bradley [name] (daughter) telephone [phone].  By history, he stopped taking his medicine about 2 weeks ago. He says that he has had trouble remaining employed and that he has not been able to obtain medications.  PHYSICAL EXAMINATION:  GENERAL:  This is a well-developed, burly man, pleasant but lethargic in no distress.  VITAL SIGNS:  Blood pressure 150/100, resting pulse 60, respirations 20, temperature 98.7, pulse oximetry 98%.  HEENT:  No signs of  infection, no cranial or cervical bruits. Supple neck, full range of motion. No meningismus.  LUNGS:  Clear to auscultation.  HEART:  No murmurs or gallops. Pulses normal.  ABDOMEN:  Soft, nontender. Bowel sounds normal.  EXTREMITIES:  Without edema or cyanosis. No alteration in tone or tight heel cords.  NEUROLOGIC:  The patient is lethargic. He was confused as to place and date although after I told him where he was and what date it was, he was able to recall those. He was able to name objects and follow simple commands.  Cranial nerves:  The patient is complaining of problems with his vision. He says the t.v. is not sharp and clear. The patient shows round reactive pupils that are small. Fundi appear to be normal although it was hard to see them because of his horizontal nystagmus. The visual fields are full to double simultaneous stimuli. Extraocular movements full and conjugate. OKN responses were poor. Symmetrically, he has a right central seventh. Midline tongue and uvula. Air conduction greater than bone conduction bilaterally. The patient has horizontal nystagmus at rest that is pendular and does not appear to favor one direction or the other. He also seems to have bilateral eyelid ptosis.  MOTOR EXAMINATION:  The patient showed normal strength in his arms and his legs. He has no pronator drift although he is unsteady with his hands.  Sensation showed mild peripheral neuropathy with good stereognosis. Cerebellar examination the patient had significant dysmetria on the right side. Gait, the patient is unable to walk. He has a very broad based gait. He can only stay upright while sitting if he props himself. Deep tendon reflexes normal proximally, absent distally, patient bilateral flexor plantar responses.  IMPRESSION: 1. 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 3. Uncontrolled hypertension  malignant. 404.01. 4. Bilateral small vessel strokes of different ages. 433.31.  5. 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 6. Significant financial strains.  PLAN: 1. MRI brain. 2. MRI intracranial. 3. Carotid Doppler. 4. I will review the 2-D echo when it is available. 5. I agree with aspirin, Plavix and blood pressure control. 6. PT and OT consults for gait training activities of daily living. 7. Patient and family services evaluation to review his financial    situation.  I appreciate the opportunity to see him. If you have questions or I can be of assistance, dont hesitate to contact me. DD:  05/11/00 TD:  05/11/00 Job: [id number]

## 2012-03-10 ENCOUNTER — Emergency Department (HOSPITAL_COMMUNITY): Payer: Medicare Other

## 2012-03-10 ENCOUNTER — Other Ambulatory Visit: Payer: Self-pay

## 2012-03-10 ENCOUNTER — Inpatient Hospital Stay (HOSPITAL_COMMUNITY)
Admission: EM | Admit: 2012-03-10 | Discharge: 2012-03-13 | DRG: 065 | Disposition: A | Discharge status: Skilled Nursing Facility | Payer: Medicare Other | Source: Ambulatory Visit | Attending: Internal Medicine | Admitting: Internal Medicine

## 2012-03-10 ENCOUNTER — Encounter (HOSPITAL_COMMUNITY): Payer: Self-pay | Admitting: *Deleted

## 2012-03-10 DIAGNOSIS — I350 Nonrheumatic aortic (valve) stenosis: Secondary | ICD-10-CM

## 2012-03-10 DIAGNOSIS — I10 Essential (primary) hypertension: Secondary | ICD-10-CM | POA: Diagnosis present

## 2012-03-10 DIAGNOSIS — I519 Heart disease, unspecified: Secondary | ICD-10-CM | POA: Diagnosis present

## 2012-03-10 DIAGNOSIS — M6282 Rhabdomyolysis: Secondary | ICD-10-CM | POA: Diagnosis present

## 2012-03-10 DIAGNOSIS — I6350 Cerebral infarction due to unspecified occlusion or stenosis of unspecified cerebral artery: Principal | ICD-10-CM | POA: Diagnosis present

## 2012-03-10 DIAGNOSIS — R471 Dysarthria and anarthria: Secondary | ICD-10-CM | POA: Diagnosis present

## 2012-03-10 DIAGNOSIS — I509 Heart failure, unspecified: Secondary | ICD-10-CM | POA: Diagnosis present

## 2012-03-10 DIAGNOSIS — I359 Nonrheumatic aortic valve disorder, unspecified: Secondary | ICD-10-CM | POA: Diagnosis present

## 2012-03-10 DIAGNOSIS — Z87891 Personal history of nicotine dependence: Secondary | ICD-10-CM

## 2012-03-10 DIAGNOSIS — E785 Hyperlipidemia, unspecified: Secondary | ICD-10-CM | POA: Diagnosis present

## 2012-03-10 DIAGNOSIS — R29898 Other symptoms and signs involving the musculoskeletal system: Secondary | ICD-10-CM | POA: Diagnosis present

## 2012-03-10 DIAGNOSIS — D649 Anemia, unspecified: Secondary | ICD-10-CM | POA: Diagnosis present

## 2012-03-10 DIAGNOSIS — I472 Ventricular tachycardia, unspecified: Secondary | ICD-10-CM

## 2012-03-10 DIAGNOSIS — I639 Cerebral infarction, unspecified: Secondary | ICD-10-CM | POA: Diagnosis present

## 2012-03-10 HISTORY — DX: Heart failure, unspecified: I50.9

## 2012-03-10 HISTORY — DX: Nontoxic goiter, unspecified: E04.9

## 2012-03-10 HISTORY — DX: Hyperlipidemia, unspecified: E78.5

## 2012-03-10 HISTORY — DX: Cerebral infarction, unspecified: I63.9

## 2012-03-10 HISTORY — DX: Rhabdomyolysis: M62.82

## 2012-03-10 HISTORY — DX: Monoclonal gammopathy: D47.2

## 2012-03-10 HISTORY — DX: Essential (primary) hypertension: I10

## 2012-03-10 HISTORY — DX: Nonrheumatic aortic (valve) stenosis: I35.0

## 2012-03-10 LAB — BASIC METABOLIC PANEL
BUN: 23 mg/dL (ref 6–23)
CO2: 23 mEq/L (ref 19–32)
Chloride: 103 mEq/L (ref 96–112)
GFR calc Af Amer: >90 mL/min (ref >90)
Potassium: 4 mEq/L (ref 3.5–5.1)

## 2012-03-10 LAB — URINALYSIS, ROUTINE W REFLEX MICROSCOPIC
Bilirubin Urine: NEGATIVE
Hgb urine dipstick: NEGATIVE
Specific Gravity, Urine: 1.017 (ref 1.005–1.030)
pH: 5.5 (ref 5.0–8.0)

## 2012-03-10 LAB — CBC
HCT: 38.3 % — ABNORMAL LOW (ref 39.0–52.0)
Hemoglobin: 12.9 g/dL — ABNORMAL LOW (ref 13.0–17.0)
MCHC: 33.7 g/dL (ref 30.0–36.0)
WBC: 8.7 10*3/uL (ref 4.0–10.5)

## 2012-03-10 LAB — DIFFERENTIAL
Basophils Absolute: 0 10*3/uL (ref 0.0–0.1)
Basophils Relative: 0 % (ref 0–1)
Lymphocytes Relative: 10 % — ABNORMAL LOW (ref 12–46)
Monocytes Absolute: 0.6 10*3/uL (ref 0.1–1.0)
Monocytes Relative: 7 % (ref 3–12)
Neutro Abs: 7.1 10*3/uL (ref 1.7–7.7)
Neutrophils Relative %: 82 % — ABNORMAL HIGH (ref 43–77)

## 2012-03-10 LAB — TROPONIN I: Troponin I: <0.3 ng/mL (ref <0.30)

## 2012-03-10 MED ORDER — SODIUM CHLORIDE 0.9 % IV SOLN
INTRAVENOUS | Status: DC
  Administered 2012-03-10: 1000 mL via INTRAVENOUS

## 2012-03-10 MED ORDER — SODIUM CHLORIDE 0.9 % IV BOLUS (SEPSIS)
500.0000 mL | Freq: Once | INTRAVENOUS | Status: AC
  Administered 2012-03-10: 500 mL via INTRAVENOUS

## 2012-03-10 NOTE — ED Notes (Signed)
Patient voided. Urinal thrown away by MRI. No sample collected at this time. RN aware

## 2012-03-10 NOTE — Consult Note (Signed)
Neurology Consultation  Referring Physician: Dr. Clarene Duke Primary Care Physician: No primary provider on file.  Chief Complaint: Left sided weakenss  HPI: Bradley [name] is a 71 y.o.  male with a history of prior strokes who presents due to left sided weakness and slurred speech.  The patient states this started a couple of days ago and started with weakness in his left leg to th epoint that it made it difficult for him to stand up and walk.  He the noticed some numbness in his hand on the left side.  Then this morning his speech was much worse and he was slurring his words.  When his family checked on him and found this, they brought him to the ER for evaluation.  While in the ED he had an MRI that showed an acute R BG/Internal capsule infarct.  Current facility-administered medications:0.9 %  sodium chloride infusion, , Intravenous, Continuous, Laray Anger, DO, Last Rate: 75 mL/hr at 03/10/12 1941, 1,000 mL at 03/10/12 1941;  sodium chloride 0.9 % bolus 500 mL, 500 mL, Intravenous, Once, Laray Anger, DO, 500 mL at 03/10/12 2249 Current outpatient prescriptions:amLODipine (NORVASC) 10 MG tablet, Take 10 mg by mouth daily., Disp: , Rfl: ;  cloNIDine (CATAPRES - DOSED IN MG/24 HR) 0.3 mg/24hr, Place 1 patch onto the skin once a week., Disp: , Rfl: ;  furosemide (LASIX) 40 MG tablet, Take 20 mg by mouth 2 (two) times daily., Disp: , Rfl: ;  hydrALAZINE (APRESOLINE) 50 MG tablet, Take 50 mg by mouth 2 (two) times daily., Disp: , Rfl:  irbesartan (AVAPRO) 300 MG tablet, Take 300 mg by mouth daily., Disp: , Rfl: ;  labetalol (NORMODYNE) 300 MG tablet, Take 600 mg by mouth 2 (two) times daily., Disp: , Rfl: ;  pravastatin (PRAVACHOL) 10 MG tablet, Take 10 mg by mouth daily., Disp: , Rfl: ;  Tamsulosin HCl (FLOMAX) 0.4 MG CAPS, Take 0.4 mg by mouth daily., Disp: , Rfl:    Past Medical History  Diagnosis Date  . Stroke   . Hypertension   . Hyperlipidemia   . Aortic stenosis   . CHF  (congestive heart failure)   . MGUS (monoclonal gammopathy of unknown significance)   . Goiter   . Rhabdomyolysis      Past Surgical History  Procedure Date  . Circumcision     No Known Allergies  History reviewed. No pertinent family history.  History  Substance Use Topics  . Smoking status: Former Games developer  . Smokeless tobacco: Not on file  . Alcohol Use: No      Review of Systems A complete review of systems was performed and was negative.  Physical Exam: BP 153/76  Pulse 68  Temp(Src) 99.6 F (37.6 C) (Oral)  Resp 25  SpO2 99%  GENERAL:     Well nourished, well hydrated, no acute distress.   CARDIOVASCULAR:   - Regular rate and rhythm, no thrills or palpable murmurs, S1, S2, no murmur, no rubs or gallops.   MENTAL STATUS EXAM:    - Orientation: Alert and oriented to person, place and time.  - Memory: Cooperative, follows commands well. Recent and remote memory normal.  - Attention, concentration: Attention span and concentration are normal.  - Language: Speech is moderately dysarthric and language is normal.  - Fund of knowledge: Aware of current events, vocabulary appropriate for patient age.   CRANIAL NERVES:    - CN 2 (Optic): Visual fields intact to confrontation, funduscopic examination without  optic disk pallor or edema, retinal vessels are normal.  - CN 3,4,6 (EOM): Pupils equal and reactive to light and near full eye movement without nystagmus.  - CN 5 (Trigeminal): Facial sensation is normal, no weakness of masticatory muscles.  - CN 7 (Facial): No facial weakness or asymmetry.  - CN 8 (Auditory): Auditory acuity grossly normal.  - CN 9,10 (Glossophar): The uvula is midline, the palate elevates symmetrically.  - CN 11 (spinal access): Normal sternocleidomastoid and trapezius strength.  -CN 12 (Hypoglossal): The tongue is midline. No atrophy or fasciculations.   MMOTOR:  - Deltoids:                             (R): 5   (L): 5  - Biceps:                                (R): 5   (L): 4  - Triceps:                              (R): 5    (L): 4  - Wrist Extensors:                (R): 5    (L): 5  - Wrist Flexors:                    (R): 5    (L): 5  - Grip:         (R): 5    (L): 5  - Hip Flexors:                       (R): 5    (L): 4  - Quadriceps:                       (R): 5    (L): 5  - Hamstrings:                       (R): 5    (L): 5  - Tibialis Anterior:                 (R): 5    (L): 5  - Medial Gastrocnemius:     (R): 5    (L): 5  Muscle Tone: Tone and muscle bulk are normal in the upper and lower extremities.   REFLEXES:   - Biceps:                 (R): 3+  (L):3+  - Brachioradialis:    (R): 3+  (L): 3+  - Patellar:                (R): 3+   (L):3+  - Achilles:                (R): 3+   (L):3+  - Babinski:   (R): absent  (L): absent  COORDINATION:  finger-to-nose intact, heel-to-shin intact, and rapid alternating movements intact, no tremor.   SENSATION:   Intact to light touch, vibration, pinprick.  GAIT:Pt deferred  NIHSS: 2 (dysarthria)     Diagnostic Studies: MRI Brain- R sided acute infarct in the posterior right basal ganglia and internal capsule, diffuse white matter disease and prior L occipital infarct.  Impression: 71 y/o male with a right sided BG infarct, likely due to HTN and other small vessel risk factors.  He was not a code stroke or tPA candidate as his symptoms started over 2 days ago.  Plan: - Recommend hospitalist service for admission. - NPO until bedside swallow eval, may need more formal swallow study. - Transcranial Dopplers and Carotid Ultrasound - TTE with bubble study - If not done already, please check HbA1c, Fasting Lipid Profile, and TSH - Monitor on Telemetry for arrhythmias. - Antiplatelet/Anticoaguation:  Start on 325 mg Aspirin tonight - PT/OT/ST - Stroke team to follow up in am.  It has been a pleasure to participate in the care of this patient.  Best Regards, Lajuana Carry,  MD

## 2012-03-10 NOTE — ED Notes (Signed)
Pt in c/o weakness and aphasia since this am, pt woke up with symptoms, pt family states he was his normal yesterday, family visited this am and patient had fallen and was in floor

## 2012-03-10 NOTE — ED Notes (Addendum)
Patient aware of need for urine specimen. Patient unable to void at this time. Patient given urinal. Encouraged to call for assistance if needed.   Patient prefers for personal assistance by a male.

## 2012-03-10 NOTE — H&P (Signed)
PCP:  No primary provider on file.   DOA:  03/10/2012  6:12 PM  Chief Complaint:  Slurry speech since 1 day  HPI: 71 y/o AA male with hx of uncontrolled HTN, moderate AS and grade 1 diastolic dysfunction per echo in 2012, BPH, , hx of stroke in past without residual deficit was brought in by family after having slurry speech since this morning. As per patient he woke up around 7-730 in am and while trying to walk around slumped in the ground and simply couldn't get up. His famiyl saw him later during the day and notice him to have slurry speech. patient denies any weakness , dizziness, chest pain , palpitations, blurring of vision, headache, tingling or numbness of limbs. He denies passing out or being dizzy. As per daughter at bedside she gives him all his meds daily. Patient denies any abdominal pain, bowel or urinary symptoms. He lives alone with his wife and daughter living nearby and is capable of most of his ADLs. On my ecvalaution he still had slurry speech. A code stroke was called in ED but no tPA was given as he had crossed the window. 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 Neurology consulted and planned on transferring to [facility] for further management.   Allergies: No Known Allergies  Prior to Admission medications   Medication Sig Start Date End Date Taking? Authorizing Provider  amLODipine (NORVASC) 10 MG tablet Take 10 mg by mouth daily.   Yes Historical Provider, MD  cloNIDine (CATAPRES - DOSED IN MG/24 HR) 0.3 mg/24hr Place 1 patch onto the skin once a week.   Yes Historical Provider, MD  furosemide (LASIX) 40 MG tablet Take 20 mg by mouth 2 (two) times daily.   Yes Historical Provider, MD  hydrALAZINE (APRESOLINE) 50 MG tablet Take 50 mg by mouth 2 (two) times daily.   Yes Historical Provider, MD  irbesartan (AVAPRO) 300 MG tablet Take 300 mg by mouth daily.   Yes Historical Provider, MD  labetalol  (NORMODYNE) 300 MG tablet Take 600 mg by mouth 2 (two) times daily.   Yes Historical Provider, MD  pravastatin (PRAVACHOL) 10 MG tablet Take 10 mg by mouth daily.   Yes Historical Provider, MD  Tamsulosin HCl (FLOMAX) 0.4 MG CAPS Take 0.4 mg by mouth daily.   Yes Historical Provider, MD    Past Medical History  Diagnosis Date  . Stroke   . Hypertension   . Hyperlipidemia   . Aortic stenosis   . CHF (congestive heart failure)   . MGUS (monoclonal gammopathy of unknown significance)   . Goiter   . Rhabdomyolysis     Past Surgical History  Procedure Date  . Circumcision     Social History:  reports that he has quit smoking. He does not have any smokeless tobacco history on file. He reports that he does not drink alcohol or use illicit drugs.  History reviewed. No pertinent family history.  Review of Systems:  Constitutional: Denies fever, chills, diaphoresis, appetite change and fatigue. had weakness with slumping on the ground and unable to get up.  HEENT: Denies photophobia, eye pain, redness, hearing loss, ear pain, congestion, sore throat, rhinorrhea, sneezing, mouth sores, trouble swallowing, neck pain, neck stiffness and tinnitus.   Respiratory: Denies SOB, DOE, cough, chest tightness,  and wheezing.   Cardiovascular: Denies chest pain, palpitations and leg swelling.  Gastrointestinal: Denies nausea, vomiting, abdominal pain,  diarrhea, constipation, blood in stool and abdominal distention.  Genitourinary: Denies dysuria, urgency, frequency, hematuria, flank pain and difficulty urinating.  Musculoskeletal: Denies myalgias, back pain, joint swelling, arthralgias and gait problem.  Skin: Denies pallor, rash and wound.  Neurological: slurry speech,  Denies dizziness, seizures, syncope, weakness, light-headedness, numbness and headaches.  Hematological: Denies adenopathy. Easy bruising, personal or family bleeding history  Psychiatric/Behavioral: Denies suicidal ideation, mood  changes, confusion, nervousness, sleep disturbance and agitation   Physical Exam:  Filed Vitals:   03/10/12 2120 03/10/12 2140 03/10/12 2220 03/10/12 2240  BP: 142/66 138/66 134/68 153/76  Pulse: 69 66 65 68  Temp:      TempSrc:      Resp: 28 19 20 25   SpO2: 99% 99% 98% 99%    Constitutional: Vital signs reviewed.  Patient is a well-developed and well-nourished in no acute distress and cooperative with exam. Alert and oriented x3.  HEENT: PEERLA, EOMI, no pallor, moist oral mucosa,no LAD,  Cardiovascular: s1&s2 Irregular,  with multiple PVCs on tele, grade 4/6 systolic murmur,  pulses symmetric and intact bilaterally Pulmonary/Chest: CTAB, no wheezes, rales, or rhonchi Abdominal: Soft. Non-tender, non-distended, bowel sounds are normal, no masses, organomegaly, or guarding present.  GU: no CVA tenderness Musculoskeletal: No joint deformities, erythema, or stiffness, ROM full and no nontender Ext: no edema and no cyanosis, pulses palpable bilaterally (DP and PT) Hematology: no cervical, inginal, or axillary adenopathy.  Neurological: A&O x3, slurry speech, no facial droop, cranial nerves II-XII otherwise intact, Strenght is normal and symmetric bilaterally, cranial nerve II-XII are grossly intact, no focal motor deficit, sensory intact to light touch bilaterally. gait not assesed Skin: Warm, dry and intact. No rash, cyanosis, or clubbing.  Psychiatric: Normal mood and affect. speech and behavior is normal. Judgment and thought content normal. Cognition and memory are normal.   Labs on Admission:  Results for orders placed during the hospital encounter of 03/10/12 (from the past 48 hour(s))  CBC     Status: Abnormal   Collection Time   03/10/12  7:27 PM      Component Value Range Comment   WBC 8.7  4.0 - 10.5 (K/uL)    RBC 4.41  4.22 - 5.81 (MIL/uL)    Hemoglobin 12.9 (*) 13.0 - 17.0 (g/dL)    HCT 16.1 (*) 09.6 - 52.0 (%)    MCV 86.8  78.0 - 100.0 (fL)    MCH 29.3  26.0 - 34.0  (pg)    MCHC 33.7  30.0 - 36.0 (g/dL)    RDW 04.5  40.9 - 81.1 (%)    Platelets 177  150 - 400 (K/uL)   DIFFERENTIAL     Status: Abnormal   Collection Time   03/10/12  7:27 PM      Component Value Range Comment   Neutrophils Relative 82 (*) 43 - 77 (%)    Neutro Abs 7.1  1.7 - 7.7 (K/uL)    Lymphocytes Relative 10 (*) 12 - 46 (%)    Lymphs Abs 0.9  0.7 - 4.0 (K/uL)    Monocytes Relative 7  3 - 12 (%)    Monocytes Absolute 0.6  0.1 - 1.0 (K/uL)    Eosinophils Relative 0  0 - 5 (%)    Eosinophils Absolute 0.0  0.0 - 0.7 (K/uL)    Basophils Relative 0  0 - 1 (%)    Basophils Absolute 0.0  0.0 - 0.1 (K/uL)   BASIC METABOLIC PANEL     Status: Abnormal  Collection Time   03/10/12  7:27 PM      Component Value Range Comment   Sodium 139  135 - 145 (mEq/L)    Potassium 4.0  3.5 - 5.1 (mEq/L)    Chloride 103  96 - 112 (mEq/L)    CO2 23  19 - 32 (mEq/L)    Glucose, Bld 88  70 - 99 (mg/dL)    BUN 23  6 - 23 (mg/dL)    Creatinine, Ser 1.61  0.50 - 1.35 (mg/dL)    Calcium 9.7  8.4 - 10.5 (mg/dL)    GFR calc non Af Amer 82 (*) >90 (mL/min)    GFR calc Af Amer >90  >90 (mL/min)   TROPONIN I     Status: Normal   Collection Time   03/10/12  7:27 PM      Component Value Range Comment   Troponin I <0.30  <0.30 (ng/mL)   CK TOTAL AND CKMB     Status: Abnormal   Collection Time   03/10/12  7:27 PM      Component Value Range Comment   Total CK 1388 (*) 7 - 232 (U/L)    CK, MB 4.1 (*) 0.3 - 4.0 (ng/mL)    Relative Index 0.3  0.0 - 2.5    URINALYSIS, ROUTINE W REFLEX MICROSCOPIC     Status: Abnormal   Collection Time   03/10/12 10:37 PM      Component Value Range Comment   Color, Urine YELLOW  YELLOW     APPearance CLEAR  CLEAR     Specific Gravity, Urine 1.017  1.005 - 1.030     pH 5.5  5.0 - 8.0     Glucose, UA NEGATIVE  NEGATIVE (mg/dL)    Hgb urine dipstick NEGATIVE  NEGATIVE     Bilirubin Urine NEGATIVE  NEGATIVE     Ketones, ur TRACE (*) NEGATIVE (mg/dL)    Protein, ur 096 (*)  NEGATIVE (mg/dL)    Urobilinogen, UA 0.2  0.0 - 1.0 (mg/dL)    Nitrite NEGATIVE  NEGATIVE     Leukocytes, UA TRACE (*) NEGATIVE    URINE MICROSCOPIC-ADD ON     Status: Abnormal   Collection Time   03/10/12 10:37 PM      Component Value Range Comment   WBC, UA 0-2  <3 (WBC/hpf)    Bacteria, UA MANY (*) RARE      Radiological Exams on Admission: MRI HEAD WITHOUT CONTRAST  Technique: Multiplanar, multiecho pulse sequences of the brain and  surrounding structures were obtained according to standard protocol  without intravenous contrast.  Comparison: Head CT 02/26/2011  Findings: There is a 7 mm focus of acute infarction within the  basal ganglia/posterior limb internal capsule on the right. No  other acute infarction.  The brainstem is unremarkable. The cerebellum shows generalized  atrophy. The cerebral hemispheres show old small vessel  infarctions within the basal ganglia, thalami and throughout the  hemispheric deep white matter. There is an old cortical and  subcortical infarction in the left occipital lobe. No mass lesion,  hemorrhage, hydrocephalus or extra-axial collection. No pituitary  mass. No inflammatory sinus disease. No skull or skull base  lesion.  IMPRESSION:  7 mm acute infarction in the right basal ganglia/posterior limb  internal capsule.  Extensive chronic small vessel changes elsewhere throughout the  brain. Old left occipital cortical and subcortical infarction.   EKG: NSR with 1st degree AVblock and TWI in lateral leads, no old EKG to  compare  Assessment/Plan 71 y/o AA male with hx of previous CVA, uncontrolled HTN , BPH, moderate AS and gr 1 diastolic dysfn per echo in 2012 presented with slurry speech and slumping on the ground bing unable to get up for several hours during the day with findings of acute rt basal ganglia/ post limb internal capsule stroke and rhabdomyolysis.     acute stroke  patient has residual symptoms of slurry speech only Will  admit to neuro tele. patient will be transferred to hospitalist service at [facility] for further management and stroke team follow up. Cont ASA 300 gm rectally daily until cleared by speech Will get complete stroke w/up including 2d echo and carotid doppler. Send A1C and lipid panel  will hold statin due to rhabdomyolysis Allow permissive HTN PT/ OT eval  seen by neuro consult in ED and will follow recs    Moderate aortic stenosis As per echo 1 year back, seen by Dr Tenny Craw ([facility]) in past  Patient has significant murmur on exam  will get repeat echo   Diastolic dysfunction, left ventricle As per echo , clinically euvolemic Cont lasix   Hypertension On multiple meds  will hold BP meds except lasix and ARB to allow permissive HTN  BPH Cont flomax  Diet:  NPO for now until seen by speech and swallow  DVT prophylaxis  full code  Time Spent on Admission: 50 minutes  Aldrin Engelhard 03/10/2012, 11:28 PM

## 2012-03-10 NOTE — ED Notes (Signed)
Neurology at bedside.

## 2012-03-10 NOTE — ED Provider Notes (Signed)
History     CSN: [id number]  Arrival date & time 03/10/12  4401   First MD Initiated Contact with Patient 03/10/12 1823      Chief Complaint  Patient presents with  . Cerebrovascular Accident    Patient is a 71 y.o. male presenting with Acute Neurological Problem.  Cerebrovascular Accident  Pt was seen at 1825.  Per pt and his family, c/o gradual onset and improvement in constant LUE and LLE weakness, as well as slurred speech that began this morning approx 0700/0730.  Pt as LSN by family last night.  Pt states he woke up this morning, got out of bed and fell due to LUE and LLE weakness.  Pt also states he noticed his speech was slurred at that time.  Pt was unable to get up from the floor on his own.  Pt's wife and daughter checked in on him this afternoon PTA, found pt laying on the floor.  Pt was "off balance" while walking with his family PTA.  Pt's family states his LUE and LLE weakness was improving on the way to the ED, but pt's speech continues "slurred."  Pt himself denies syncope, no CP/palpitations, no SOB/cough, no N/V/D, no abd pain, no back pain, no headache, no visual changes, no tingling/numbness in extremities.     PMD:  Dr. Catha Gosselin Past Medical History  Diagnosis Date  . Stroke   . Hypertension   . Hyperlipidemia   . Aortic stenosis   . CHF (congestive heart failure)   . MGUS (monoclonal gammopathy of unknown significance)   . Goiter     Past Surgical History  Procedure Date  . Circumcision      History  Substance Use Topics  . Smoking status: Former Games developer  . Smokeless tobacco: Not on file  . Alcohol Use: No    Review of Systems ROS: Statement: All systems negative except as marked or noted in the HPI; Constitutional: Negative for fever and chills. ; ; Eyes: Negative for eye pain, redness and discharge. ; ; ENMT: Negative for ear pain, hoarseness, nasal congestion, sinus pressure and sore throat. ; ; Cardiovascular: Negative for chest pain,  palpitations, diaphoresis, dyspnea and peripheral edema. ; ; Respiratory: Negative for cough, wheezing and stridor. ; ; Gastrointestinal: Negative for nausea, vomiting, diarrhea, abdominal pain, blood in stool, hematemesis, jaundice and rectal bleeding. . ; ; Genitourinary: Negative for dysuria, flank pain and hematuria. ; ; Musculoskeletal: Negative for back pain and neck pain. Negative for swelling and trauma.; ; Skin: Negative for pruritus, rash, abrasions, blisters, bruising and skin lesion.; ; Neuro: +extremity weakness, slurred speech.  Negative for headache, lightheadedness and neck stiffness. Negative for altered level of consciousness , altered mental status, paresthesias, involuntary movement, seizure and syncope.     Allergies  Review of patient's allergies indicates no known allergies.  Home Medications   Current Outpatient Rx  Name Route Sig Dispense Refill  . AMLODIPINE BESYLATE 10 MG PO TABS Oral Take 10 mg by mouth daily.    Marland Kitchen CLONIDINE HCL 0.3 MG/24HR TD PTWK Transdermal Place 1 patch onto the skin once a week.    . FUROSEMIDE 40 MG PO TABS Oral Take 20 mg by mouth 2 (two) times daily.    Marland Kitchen HYDRALAZINE HCL 50 MG PO TABS Oral Take 50 mg by mouth 2 (two) times daily.    . IRBESARTAN 300 MG PO TABS Oral Take 300 mg by mouth daily.    Marland Kitchen LABETALOL HCL 300 MG PO  TABS Oral Take 600 mg by mouth 2 (two) times daily.    Marland Kitchen PRAVASTATIN SODIUM 10 MG PO TABS Oral Take 10 mg by mouth daily.    Marland Kitchen TAMSULOSIN HCL 0.4 MG PO CAPS Oral Take 0.4 mg by mouth daily.      BP 186/78  Pulse 72  Temp(Src) 99.6 F (37.6 C) (Oral)  Resp 16  SpO2 98%  Physical Exam 1830: Physical examination:  Nursing notes reviewed; Vital signs and O2 SAT reviewed;  Constitutional: Well developed, Well nourished, In no acute distress; Head:  Normocephalic, atraumatic; Eyes: EOMI, PERRL, No scleral icterus; ENMT: Mouth and pharynx normal, Mucous membranes dry; Neck: Supple, Full range of motion, No lymphadenopathy;  Cardiovascular: Regular rate and rhythm, No murmur, rub, or gallop; Respiratory: Breath sounds clear & equal bilaterally, No rales, rhonchi, wheezes, or rub, Normal respiratory effort/excursion; Chest: Nontender, Movement normal; Abdomen: Soft, Nontender, Nondistended, Normal bowel sounds; Extremities: Pulses normal, No tenderness, No edema, No calf edema or asymmetry.; Neuro: AA&Ox3, Major CN grossly intact.  Strength 5/5 equal bilat UE's and LE's.  DTR 2/4 equal bilat UE's and LE's.  No gross sensory deficits.  Normal cerebellar testing bilat UE's and LE's.  No pronator drift. +speech slurred.  No facial droop.; Skin: Color normal, Warm, Dry, no rash.    ED Course  Procedures   1835:  tPA in stroke considered, but not given due to the following:  Onset over 3-4 hours (LSN last night).  2150:  Dx testing d/w pt and family.  Questions answered.  Verb understanding, agreeable to transfer to [facility]/admit.  T/C to Neuro D. [facility], case discussed, including:  HPI, pertinent PM/SHx, VS/PE, dx testing, ED course and treatment:  Agreeable to consult, requests to please admit to [facility].   2245:  T/C to [facility] MD, case discussed, including:  HPI, pertinent PM/SHx, VS/PE, dx testing, ED course and treatment:  Agreeable to consult to admit/transfer pt to [facility]    MDM  MDM Reviewed: nursing note, vitals and previous chart Reviewed previous: ECG Interpretation: labs, ECG, x-ray, CT scan and MRI    Date: 03/10/2012  Rate: 71  Rhythm: normal sinus rhythm  QRS Axis: normal  Intervals: PR prolonged  ST/T Wave abnormalities: nonspecific ST/T changes, flipped T-waves ant-lat leads as well as inf leads  Conduction Disutrbances:first-degree A-V block   Narrative Interpretation:   Old EKG Reviewed: unchanged, no significant changes from previous EKG dated 02/27/2011.   Results for orders placed during the hospital encounter of 03/10/12  CBC      Component Value Range   WBC 8.7  4.0 - 10.5 (K/uL)   RBC  4.41  4.22 - 5.81 (MIL/uL)   Hemoglobin 12.9 (*) 13.0 - 17.0 (g/dL)   HCT 16.1 (*) 09.6 - 52.0 (%)   MCV 86.8  78.0 - 100.0 (fL)   MCH 29.3  26.0 - 34.0 (pg)   MCHC 33.7  30.0 - 36.0 (g/dL)   RDW 04.5  40.9 - 81.1 (%)   Platelets 177  150 - 400 (K/uL)  DIFFERENTIAL      Component Value Range   Neutrophils Relative 82 (*) 43 - 77 (%)   Neutro Abs 7.1  1.7 - 7.7 (K/uL)   Lymphocytes Relative 10 (*) 12 - 46 (%)   Lymphs Abs 0.9  0.7 - 4.0 (K/uL)   Monocytes Relative 7  3 - 12 (%)   Monocytes Absolute 0.6  0.1 - 1.0 (K/uL)   Eosinophils Relative 0  0 -  5 (%)   Eosinophils Absolute 0.0  0.0 - 0.7 (K/uL)   Basophils Relative 0  0 - 1 (%)   Basophils Absolute 0.0  0.0 - 0.1 (K/uL)  BASIC METABOLIC PANEL      Component Value Range   Sodium 139  135 - 145 (mEq/L)   Potassium 4.0  3.5 - 5.1 (mEq/L)   Chloride 103  96 - 112 (mEq/L)   CO2 23  19 - 32 (mEq/L)   Glucose, Bld 88  70 - 99 (mg/dL)   BUN 23  6 - 23 (mg/dL)   Creatinine, Ser 4.09  0.50 - 1.35 (mg/dL)   Calcium 9.7  8.4 - 81.1 (mg/dL)   GFR calc non Af Amer 82 (*) >90 (mL/min)   GFR calc Af Amer >90  >90 (mL/min)  TROPONIN I      Component Value Range   Troponin I <0.30  <0.30 (ng/mL)  CK TOTAL AND CKMB      Component Value Range   Total CK 1388 (*) 7 - 232 (U/L)   CK, MB 4.1 (*) 0.3 - 4.0 (ng/mL)   Relative Index 0.3  0.0 - 2.5    Dg Chest 2 View 03/10/2012  *RADIOLOGY REPORT*  Clinical Data: CHF, slurred speech  CHEST - 2 VIEW  Comparison: 02/26/2011  Findings: There is rightward deviation of the trachea at the level of the thoracic inlet as previously noted.  Minimal linear scarring or subsegmental atelectasis in the lingula.  Right lung clear. Stable mild cardiomegaly.  Atheromatous aortic arch.  No effusion. Minimal spurring in the mid thoracic spine.  IMPRESSION:  1.  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 2.  No acute or superimposed abnormality.  Original Report  Authenticated By: Osa Craver, M.D.   Mr Brain Wo Contrast 03/10/2012  *RADIOLOGY REPORT*  Clinical Data: Aphasia.  Weakness.  Fall.  MRI HEAD WITHOUT CONTRAST  Technique:  Multiplanar, multiecho pulse sequences of the brain and surrounding structures were obtained according to standard protocol without intravenous contrast.  Comparison: Head CT 02/26/2011  Findings: There is a 7 mm focus of acute infarction within the basal ganglia/posterior limb internal capsule on the right.  No other acute infarction.  The brainstem is unremarkable.  The cerebellum shows generalized atrophy.  The cerebral hemispheres show old small vessel infarctions within the basal ganglia, thalami and throughout the hemispheric deep white matter. There is an old cortical and subcortical infarction in the left occipital lobe.  No mass lesion, hemorrhage, hydrocephalus or extra-axial collection. No pituitary mass.  No inflammatory sinus disease.  No skull or skull base lesion.  IMPRESSION: 7 mm acute infarction in the right basal ganglia/posterior limb internal capsule.  Extensive chronic small vessel changes elsewhere throughout the brain.  Old left occipital cortical and subcortical infarction.  Original Report Authenticated By: Thomasenia Sales, M.D.             Laray Anger, DO 03/11/12 1456

## 2012-03-11 DIAGNOSIS — I359 Nonrheumatic aortic valve disorder, unspecified: Secondary | ICD-10-CM

## 2012-03-11 LAB — LIPID PANEL
HDL: 54 mg/dL (ref >39)
Total CHOL/HDL Ratio: 3.7 RATIO
VLDL: 16 mg/dL (ref 0–40)

## 2012-03-11 LAB — CARDIAC PANEL(CRET KIN+CKTOT+MB+TROPI)
CK, MB: 2.8 ng/mL (ref 0.3–4.0)
Relative Index: 0.3 (ref 0.0–2.5)
Relative Index: 0.3 (ref 0.0–2.5)
Relative Index: 0.3 (ref 0.0–2.5)
Total CK: 1000 U/L — ABNORMAL HIGH (ref 7–232)
Total CK: 1076 U/L — ABNORMAL HIGH (ref 7–232)
Troponin I: <0.3 ng/mL (ref <0.30)

## 2012-03-11 LAB — CBC
HCT: 37.3 % — ABNORMAL LOW (ref 39.0–52.0)
Hemoglobin: 12.2 g/dL — ABNORMAL LOW (ref 13.0–17.0)
MCV: 86.9 fL (ref 78.0–100.0)
Platelets: 173 10*3/uL (ref 150–400)
RBC: 4.29 MIL/uL (ref 4.22–5.81)
WBC: 5.1 10*3/uL (ref 4.0–10.5)

## 2012-03-11 LAB — CREATININE, SERUM: GFR calc Af Amer: >90 mL/min (ref >90)

## 2012-03-11 MED ORDER — ENOXAPARIN SODIUM 40 MG/0.4ML ~~LOC~~ SOLN
40.0000 mg | Freq: Every day | SUBCUTANEOUS | Status: DC
  Administered 2012-03-11 – 2012-03-12 (×2): 40 mg via SUBCUTANEOUS
  Filled 2012-03-11 (×3): qty 0.4

## 2012-03-11 MED ORDER — TAMSULOSIN HCL 0.4 MG PO CAPS
0.4000 mg | ORAL_CAPSULE | Freq: Every day | ORAL | Status: DC
  Administered 2012-03-11 – 2012-03-12 (×2): 0.4 mg via ORAL
  Filled 2012-03-11 (×3): qty 1

## 2012-03-11 MED ORDER — ASPIRIN 325 MG PO TABS
325.0000 mg | ORAL_TABLET | Freq: Every day | ORAL | Status: DC
  Administered 2012-03-11 – 2012-03-12 (×2): 325 mg via ORAL
  Filled 2012-03-11 (×3): qty 1

## 2012-03-11 MED ORDER — ACETAMINOPHEN 325 MG PO TABS
650.0000 mg | ORAL_TABLET | Freq: Four times a day (QID) | ORAL | Status: DC | PRN
  Administered 2012-03-11: 650 mg via ORAL
  Filled 2012-03-11: qty 2

## 2012-03-11 MED ORDER — SODIUM CHLORIDE 0.9 % IJ SOLN
3.0000 mL | Freq: Two times a day (BID) | INTRAMUSCULAR | Status: DC
  Administered 2012-03-11 – 2012-03-12 (×4): 3 mL via INTRAVENOUS

## 2012-03-11 MED ORDER — ASPIRIN 300 MG RE SUPP
300.0000 mg | Freq: Every day | RECTAL | Status: DC
  Filled 2012-03-11: qty 1

## 2012-03-11 MED ORDER — SODIUM CHLORIDE 0.9 % IV SOLN
INTRAVENOUS | Status: DC
  Administered 2012-03-11: 03:00:00 via INTRAVENOUS

## 2012-03-11 MED ORDER — SIMVASTATIN 20 MG PO TABS
20.0000 mg | ORAL_TABLET | Freq: Every day | ORAL | Status: DC
  Administered 2012-03-11 – 2012-03-12 (×2): 20 mg via ORAL
  Filled 2012-03-11 (×3): qty 1

## 2012-03-11 MED ORDER — IRBESARTAN 300 MG PO TABS
300.0000 mg | ORAL_TABLET | Freq: Every day | ORAL | Status: DC
  Administered 2012-03-11 – 2012-03-12 (×2): 300 mg via ORAL
  Filled 2012-03-11 (×3): qty 1

## 2012-03-11 MED ORDER — HYDRALAZINE HCL 20 MG/ML IJ SOLN
10.0000 mg | Freq: Four times a day (QID) | INTRAMUSCULAR | Status: DC | PRN
  Administered 2012-03-11 – 2012-03-12 (×2): 10 mg via INTRAVENOUS
  Filled 2012-03-11 (×2): qty 0.5

## 2012-03-11 MED ORDER — FUROSEMIDE 20 MG PO TABS
20.0000 mg | ORAL_TABLET | Freq: Two times a day (BID) | ORAL | Status: DC
  Administered 2012-03-11 – 2012-03-13 (×5): 20 mg via ORAL
  Filled 2012-03-11 (×8): qty 1

## 2012-03-11 NOTE — Progress Notes (Signed)
Occupational Therapy Evaluation Patient Details Name: Bradley [name] MRN: [id number] DOB: Mar 20, 1941 Today's Date: 03/11/2012  Problem List:  Patient Active Problem List  Diagnoses  . Stroke, small vessel  . Moderate aortic stenosis  . Diastolic dysfunction, left ventricle  . Hypertension  . H/O: CVA (cardiovascular accident)    Past Medical History:  Past Medical History  Diagnosis Date  . Stroke   . Hypertension   . Hyperlipidemia   . Aortic stenosis   . CHF (congestive heart failure)   . MGUS (monoclonal gammopathy of unknown significance)   . Goiter   . Rhabdomyolysis    Past Surgical History:  Past Surgical History  Procedure Date  . Circumcision     OT Assessment/Plan/Recommendation OT Assessment Clinical Impression Statement: Pt s/p Right basal ganglia/ internal capsule infarct and demonstrating with Lt. side weakness and slurred speech thus affecting PLOF.  Will benefit from acute OT to address below problem list in prep for d/c to SNF. OT Recommendation/Assessment: Patient will need skilled OT in the acute care venue OT Problem List: Decreased strength;Decreased activity tolerance;Impaired balance (sitting and/or standing);Decreased cognition;Decreased safety awareness;Decreased knowledge of use of DME or AE OT Therapy Diagnosis : Generalized weakness;Cognitive deficits;Paresis OT Plan OT Frequency: Min 2X/week OT Treatment/Interventions: Self-care/ADL training;DME and/or AE instruction;Therapeutic activities;Cognitive remediation/compensation;Balance training;Patient/family education OT Recommendation Follow Up Recommendations: Skilled nursing facility Equipment Recommended: Defer to next venue Individuals Consulted Consulted and Agree with Results and Recommendations: Patient;Family member/caregiver Family Member Consulted: daughter OT Goals Acute Rehab OT Goals OT Goal Formulation: With patient Time For Goal Achievement: 2 weeks ADL Goals Pt Will Perform  Grooming: with min assist;Standing at sink ADL Goal: Grooming - Progress: Goal set today Pt Will Perform Lower Body Bathing: with min assist;Sit to stand from chair;Sit to stand from bed ADL Goal: Lower Body Bathing - Progress: Goal set today Pt Will Transfer to Toilet: with min assist;with DME;3-in-1;Ambulation ADL Goal: Toilet Transfer - Progress: Goal set today Pt Will Perform Toileting - Clothing Manipulation: with supervision;Sitting on 3-in-1 or toilet;Standing ADL Goal: Toileting - Clothing Manipulation - Progress: Goal set today Pt Will Perform Toileting - Hygiene: with supervision;Sit to stand from 3-in-1/toilet;Sitting on 3-in-1 or toilet ADL Goal: Toileting - Hygiene - Progress: Goal set today  OT Evaluation Precautions/Restrictions  Precautions Precautions: Fall Required Braces or Orthoses: No Restrictions Weight Bearing Restrictions: No Prior Functioning Home Living Lives With: Alone Receives Help From: Family Type of Home: Apartment Home Layout: One level (on the second floor) Home Access: Stairs to enter Entrance Stairs-Rails: Doctor, general practice of Steps: 14 Bathroom Shower/Tub: Health visitor: Standard Home Adaptive Equipment: Shower chair with back;Walker - rolling;Wheelchair - manual;Bedside commode/3-in-1 Prior Function Level of Independence: Independent with basic ADLs;Needs assistance with homemaking;Independent with gait;Requires assistive device for independence (uses RW) Able to Take Stairs?: Yes Driving: No Vocation: Retired Comments: Pt's daughter drives pt to appointments and to the store.  Daughter takes care of meals and homemaking per daughter report. Daughter states that pt does not use RW inside apt as he shoudl. ADL ADL Upper Body Bathing: Simulated;Set up Where Assessed - Upper Body Bathing: Sitting, bed Lower Body Bathing: Simulated;Maximal assistance Lower Body Bathing Details (indicate cue type and reason):  max assist for safety and steadying when standing to wash peri area Where Assessed - Lower Body Bathing: Sit to stand from bed Upper Body Dressing: Simulated;Set up Where Assessed - Upper Body Dressing: Sitting, bed Lower Body Dressing: Simulated;Maximal assistance Lower Body Dressing Details (indicate cue  type and reason): max assist for safety and balance/steadying when standing Where Assessed - Lower Body Dressing: Sit to stand from bed Toilet Transfer: Simulated;+2 Total assistance;Comment for patient % (50%) Toilet Transfer Details (indicate cue type and reason): assist to maneuver RW safely and provide steadying/balance for posterior Lt. side lean Toilet Transfer Method: Ambulating Toilet Transfer Equipment: Other (comment) (chair) Equipment Used: Rolling walker Ambulation Related to ADLs: Pt shuffles feet and demonstrates significant posterior Lt. side lean during turns. Vision/Perception  Vision - History Patient Visual Report: No change from baseline (per pt report) Vision - Assessment Additional Comments: No deficits noted upon eval. Will continue to assess  Cognition Cognition Arousal/Alertness: Awake/alert Overall Cognitive Status: Impaired Orientation Level: Oriented X4 Following Commands: Follows one step commands inconsistently Safety/Judgement: Decreased safety judgement for tasks assessed;Decreased awareness of safety precautions Decreased Safety/Judgement: Impulsive;Decreased awareness of need for assistance Safety/Judgement - Other Comments: Pt attempting multiple times to stand from bed when unsafe to do so.  Max VC for safety during transfers. Awareness of Deficits: Decreased awareness of deficits Sensation/Coordination Sensation Light Touch: Appears Intact Coordination Gross Motor Movements are Fluid and Coordinated: Yes Fine Motor Movements are Fluid and Coordinated: No (LUE) Extremity Assessment RUE Assessment RUE Assessment: Within Functional Limits  (4/5) LUE Assessment LUE Assessment: Within Functional Limits (3+/5) Mobility  Bed Mobility Bed Mobility: Yes Supine to Sit: 4: Min assist;HOB elevated (Comment degrees);With rails (HOB 50 degrees) Sitting - Scoot to Edge of Bed: 3: Mod assist Sitting - Scoot to Edge of Bed Details (indicate cue type and reason): assist to scoot R hip to EOB with use of pad Transfers Transfers: Yes Sit to Stand: 1: +2 Total assist;Patient percentage (comment);4: Min assist;With upper extremity assist;From bed Sit to Stand Details (indicate cue type and reason): +2 assist for safety due to pt's impulsive behavior and decreased balance.  Max cueing to slow down and demonstrate correct hand placement.  Min assist during second transfer with max cuing for safety.  Stand to Sit: 3: Mod assist;To chair/3-in-1;To bed;With upper extremity assist;With armrests Stand to Sit Details: cueing for safe hand placement and to control descent Exercises   End of Session OT - End of Session Equipment Utilized During Treatment: Gait belt Activity Tolerance: Patient limited by fatigue Patient left: with call bell in reach;with family/visitor present;in chair Nurse Communication: Mobility status for transfers General Behavior During Session: Other (comment) (Impulsive) Cognition: Impaired  3:33 PM  03/11/2012 Cipriano Mile OTR/L Pager [phone] Office [phone]

## 2012-03-11 NOTE — Progress Notes (Signed)
Vascular Lab  TCD completed  Vanna Scotland 03/11/2012   7:12 PM

## 2012-03-11 NOTE — ED Notes (Signed)
Report given to [facility]. 

## 2012-03-11 NOTE — Progress Notes (Signed)
Subjective: No complaints. Wife and daughter present and updated on plan of care.  Objective: Vital signs in last 24 hours: Temp:  [98 F (36.7 C)-99.6 F (37.6 C)] 99.2 F (37.3 C) (03/14 1415) Pulse Rate:  [60-82] 66  (03/14 1415) Resp:  [16-28] 20  (03/14 1415) BP: (134-198)/(64-92) 173/80 mmHg (03/14 1415) SpO2:  [96 %-100 %] 98 % (03/14 1415) Weight:  [66.6 kg (146 lb 13.2 oz)] 66.6 kg (146 lb 13.2 oz) (03/14 0200) Weight change:  Last BM Date: 03/07/12  Intake/Output from previous day: 03/13 0701 - 03/14 0700 In: -  Out: 400 [Urine:400]     Physical Exam: General: Alert, awake, oriented x3, in no acute distress. HEENT: No bruits, no goiter. Heart: Regular rate and rhythm, without murmurs, rubs, gallops. Lungs: Clear to auscultation bilaterally. Abdomen: Soft, nontender, nondistended, positive bowel sounds. Extremities: No clubbing cyanosis or edema with positive pedal pulses.    Lab Results: Basic Metabolic Panel:  Basename 03/11/12 0655 03/10/12 1927  NA -- 139  K -- 4.0  CL -- 103  CO2 -- 23  GLUCOSE -- 88  BUN -- 23  CREATININE 0.93 0.96  CALCIUM -- 9.7  MG -- --  PHOS -- --   CBC:  Basename 03/11/12 0655 03/10/12 1927  WBC 5.1 8.7  NEUTROABS -- 7.1  HGB 12.2* 12.9*  HCT 37.3* 38.3*  MCV 86.9 86.8  PLT 173 177   Cardiac Enzymes:  Basename 03/11/12 1340 03/11/12 0655 03/10/12 1927  CKTOTAL 1000* 1058* 1388*  CKMB 2.8 3.1 4.1*  CKMBINDEX -- -- --  TROPONINI <0.30 <0.30 <0.30   Hemoglobin A1C:  Basename 03/11/12 0655  HGBA1C 5.5   Fasting Lipid Panel:  Basename 03/11/12 0655  CHOL 198  HDL 54  LDLCALC 128*  TRIG 79  CHOLHDL 3.7  LDLDIRECT --   Urine Drug Screen: Drugs of Abuse     Component Value Date/Time   LABOPIA NONE DETECTED 02/27/2011 0520   COCAINSCRNUR NONE DETECTED 02/27/2011 0520   LABBENZ NONE DETECTED 02/27/2011 0520   AMPHETMU NONE DETECTED 02/27/2011 0520   THCU NONE DETECTED 02/27/2011 0520   LABBARB  Value: NONE  DETECTED        DRUG SCREEN FOR MEDICAL PURPOSES ONLY.  IF CONFIRMATION IS NEEDED FOR ANY PURPOSE, NOTIFY LAB WITHIN 5 DAYS.        LOWEST DETECTABLE LIMITS FOR URINE DRUG SCREEN Drug Class       Cutoff (ng/mL) Amphetamine      1000 Barbiturate      200 Benzodiazepine   200 Tricyclics       300 Opiates          300 Cocaine          300 THC              50 02/27/2011 0520    Urinalysis:  Basename 03/10/12 2237  COLORURINE YELLOW  LABSPEC 1.017  PHURINE 5.5  GLUCOSEU NEGATIVE  HGBUR NEGATIVE  BILIRUBINUR NEGATIVE  KETONESUR TRACE*  PROTEINUR 100*  UROBILINOGEN 0.2  NITRITE NEGATIVE  LEUKOCYTESUR TRACE*    Studies/Results: Dg Chest 2 View  03/10/2012  *RADIOLOGY REPORT*  Clinical Data: CHF, slurred speech  CHEST - 2 VIEW  Comparison: 02/26/2011  Findings: There is rightward deviation of the trachea at the level of the thoracic inlet as previously noted.  Minimal linear scarring or subsegmental atelectasis in the lingula.  Right lung clear. Stable mild cardiomegaly.  Atheromatous aortic arch.  No effusion. Minimal spurring in  the mid thoracic spine.  IMPRESSION:  1.  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 2.  No acute or superimposed abnormality.  Original Report Authenticated By: Osa Craver, M.D.   Mr Brain Wo Contrast  03/10/2012  *RADIOLOGY REPORT*  Clinical Data: Aphasia.  Weakness.  Fall.  MRI HEAD WITHOUT CONTRAST  Technique:  Multiplanar, multiecho pulse sequences of the brain and surrounding structures were obtained according to standard protocol without intravenous contrast.  Comparison: Head CT 02/26/2011  Findings: There is a 7 mm focus of acute infarction within the basal ganglia/posterior limb internal capsule on the right.  No other acute infarction.  The brainstem is unremarkable.  The cerebellum shows generalized atrophy.  The cerebral hemispheres show old small vessel infarctions within the basal ganglia, thalami and  throughout the hemispheric deep white matter. There is an old cortical and subcortical infarction in the left occipital lobe.  No mass lesion, hemorrhage, hydrocephalus or extra-axial collection. No pituitary mass.  No inflammatory sinus disease.  No skull or skull base lesion.  IMPRESSION: 7 mm acute infarction in the right basal ganglia/posterior limb internal capsule.  Extensive chronic small vessel changes elsewhere throughout the brain.  Old left occipital cortical and subcortical infarction.  Original Report Authenticated By: Thomasenia Sales, M.D.    Medications: Scheduled Meds:   . aspirin  325 mg Oral Daily  . enoxaparin  40 mg Subcutaneous Daily  . furosemide  20 mg Oral BID  . irbesartan  300 mg Oral Daily  . simvastatin  20 mg Oral q1800  . sodium chloride  500 mL Intravenous Once  . sodium chloride  3 mL Intravenous Q12H  . Tamsulosin HCl  0.4 mg Oral Daily  . DISCONTD: aspirin  300 mg Rectal Daily   Continuous Infusions:   . sodium chloride 100 mL/hr at 03/11/12 0230  . DISCONTD: sodium chloride 1,000 mL (03/10/12 1941)   PRN Meds:.acetaminophen  Assessment/Plan:  Principal Problem:  *Stroke, small vessel Active Problems:  Moderate aortic stenosis  Diastolic dysfunction, left ventricle  Hypertension  H/O: CVA (cardiovascular accident)   #1 Right Brain CVA: Had a prior CVA but had not been taking ASA. Is back on ASA for secondary stroke prevention. ECHO/dopplers negative for source of CVA. 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 Will need SNF for rehab. Family aware and in agreement. Will alert SW. LDL 128, on statin. Aggressive Risk factor modification is key.     LOS: 1 day   [facility] Pager: [phone] 03/11/2012, 4:03 PM

## 2012-03-11 NOTE — Progress Notes (Signed)
VASCULAR LAB PRELIMINARY  PRELIMINARY  PRELIMINARY  PRELIMINARY  Carotid Dopplers completed.    Preliminary report:  There is no ICA stenosis on the right.  There is 40-59% ICA stenosis, highest end of scale.  Bilateral vertebral artery flow is antegrade.  Bradley [name] [place], 03/11/2012, 10:03 AM

## 2012-03-11 NOTE — Progress Notes (Signed)
Utilization review completed.  

## 2012-03-11 NOTE — Progress Notes (Signed)
   CARE MANAGEMENT NOTE 03/11/2012  Patient:  Bradley [name], Bradley [name]   Account Number:  [id number]  Date Initiated:  03/11/2012  Documentation initiated by:  Letha Cape  Subjective/Objective Assessment:     Action/Plan:   pt/ot eval   Anticipated DC Date:  03/15/2012   Anticipated DC Plan:  HOME W HOME HEALTH SERVICES      DC Planning Services  CM consult      Choice offered to / List presented to:             Status of service:  In process, will continue to follow Medicare Important Message given?   (If response is "NO", the following Medicare IM given date fields will be blank) Date Medicare IM given:   Date Additional Medicare IM given:    Discharge Disposition:    Per UR Regulation:    If discussed at Long Length of Stay Meetings, dates discussed:    Comments:  03/11/12 15:36 Letha Cape RN, BSN [phone] patient lives alone, await pt/ot eval.

## 2012-03-11 NOTE — Discharge Instructions (Signed)
STROKE/TIA DISCHARGE INSTRUCTIONS SMOKING Cigarette smoking nearly doubles your risk of having a stroke & is the single most alterable risk factor  If you smoke or have smoked in the last 12 months, you are advised to quit smoking for your health.  Most of the excess cardiovascular risk related to smoking disappears within a year of stopping.  Ask you doctor about anti-smoking medications  [facility] Quit Line: 1-800-QUIT NOW  Free Smoking Cessation Classes [phone]  CHOLESTEROL Know your levels; limit fat & cholesterol in your diet  Lipid Panel     Component Value Date/Time   CHOL 198 03/11/2012 0655   TRIG 79 03/11/2012 0655   HDL 54 03/11/2012 0655   CHOLHDL 3.7 03/11/2012 0655   VLDL 16 03/11/2012 0655   LDLCALC 128* 03/11/2012 0655      Many patients benefit from treatment even if their cholesterol is at goal.  Goal: Total Cholesterol (CHOL) less than 160  Goal:  Triglycerides (TRIG) less than 150  Goal:  HDL greater than 40  Goal:  LDL (LDLCALC) less than 100   BLOOD PRESSURE American Stroke Association blood pressure target is less that 120/80 mm/Hg  Your discharge blood pressure is:  BP: 166/75 mmHg (RN Notified)  Monitor your blood pressure  Limit your salt and alcohol intake  Many individuals will require more than one medication for high blood pressure  DIABETES (A1c is a blood sugar average for last 3 months) Goal HGBA1c is under 7% (HBGA1c is blood sugar average for last 3 months)  Diabetes: {STROKE DC DIABETES:22357}    No results found for this basename: HGBA1C     Your HGBA1c can be lowered with medications, healthy diet, and exercise.  Check your blood sugar as directed by your physician  Call your physician if you experience unexplained or low blood sugars.  PHYSICAL ACTIVITY/REHABILITATION Goal is 30 minutes at least 4 days per week    {STROKE DC ACTIVITY/REHAB:22359}  Activity decreases your risk of heart attack and stroke and makes your heart  stronger.  It helps control your weight and blood pressure; helps you relax and can improve your mood.  Participate in a regular exercise program.  Talk with your doctor about the best form of exercise for you (dancing, walking, swimming, cycling).  DIET/WEIGHT Goal is to maintain a healthy weight  Your discharge diet is: Cardiac *** liquids Your height is:  Height: 5\' 5"  (165.1 cm) Your current weight is: Weight: 66.6 kg (146 lb 13.2 oz) Your Body Mass Index (BMI) is:  BMI (Calculated): 24.5   Following the type of diet specifically designed for you will help prevent another stroke.  Your goal weight range is:  ***  Your goal Body Mass Index (BMI) is 19-24.  Healthy food habits can help reduce 3 risk factors for stroke:  High cholesterol, hypertension, and excess weight.  RESOURCES Stroke/Support Group:  Call [phone]  they meet the 3rd Sunday of the month on the Rehab Unit at [facility], [place] ( no meetings June, July & Aug).  STROKE EDUCATION PROVIDED/REVIEWED AND GIVEN TO PATIENT Stroke warning signs and symptoms How to activate emergency medical system (call 911). Medications prescribed at discharge. Need for follow-up after discharge. Personal risk factors for stroke. Pneumonia vaccine given:   {STROKE DC YES/NO/DATE:22363} Flu vaccine given:   {STROKE DC YES/NO/DATE:22363} My questions have been answered, the writing is legible, and I understand these instructions.  I will adhere to these goals & educational materials that have been provided  to me after my discharge from the hospital.

## 2012-03-11 NOTE — Evaluation (Signed)
Physical Therapy Evaluation Patient Details Name: Bradley [name] MRN: [id number] DOB: [birth year] Today's Date: 03/11/2012  Problem List:  Patient Active Problem List  Diagnoses  . Stroke, small vessel  . Moderate aortic stenosis  . Diastolic dysfunction, left ventricle  . Hypertension  . H/O: CVA (cardiovascular accident)    Past Medical History:  Past Medical History  Diagnosis Date  . Stroke   . Hypertension   . Hyperlipidemia   . Aortic stenosis   . CHF (congestive heart failure)   . MGUS (monoclonal gammopathy of unknown significance)   . Goiter   . Rhabdomyolysis    Past Surgical History:  Past Surgical History  Procedure Date  . Circumcision     PT Assessment/Plan/Recommendation Clinical Impression Statement: Pt s/p Right basal ganglia/ internal capsule infarct with Lt. side weakness and slurred speech with increased dependencies .  Will benefit from acute PT to address below problem list in prep for d/c to SNF. PT Recommendation/Assessment: Patient will need skilled PT in the acute care venue PT Problem List: Decreased strength;Decreased balance;Decreased mobility;Decreased cognition;Decreased knowledge of use of DME;Decreased safety awareness Barriers to Discharge: Decreased caregiver support PT Therapy Diagnosis : Difficulty walking PT Frequency: Min 3X/week PT Treatment/Interventions: DME instruction;Gait training;Functional mobility training;Therapeutic activities;Balance training;Neuromuscular re-education;Cognitive remediation;Patient/family education Follow Up Recommendations: Skilled nursing facility;Supervision/Assistance - 24 hour Equipment Recommended: Defer to next venue PT Goals  Acute Rehab PT Goals PT Goal Formulation: With patient/family Time For Goal Achievement: 7 days Pt will go Supine/Side to Sit: with supervision;with HOB 0 degrees;Other (comment) (demonstrating safe judgement (i.e. non-impulsive)) PT Goal: Supine/Side to Sit - Progress: Goal  set today Pt will go Sit to Supine/Side: with supervision;with HOB 0 degrees PT Goal: Sit to Supine/Side - Progress: Goal set today Pt will go Sit to Stand: with supervision;with upper extremity assist;with cues (comment type and amount) (verbal cues < 25% of trials) PT Goal: Sit to Stand - Progress: Goal set today Pt will go Stand to Sit: with supervision;with upper extremity assist;with cues (comment type and amount) (verbal cues < 25% of trials) PT Goal: Stand to Sit - Progress: Goal set today Pt will Stand: with min assist;3 - 5 min;with unilateral upper extremity support;Other (comment) (while performing reaching tasks) PT Goal: Stand - Progress: Goal set today Pt will Ambulate: 51 - 150 feet;with min assist;with rolling walker PT Goal: Ambulate - Progress: Goal set today  PT Evaluation Precautions/Restrictions  Precautions Precautions: Fall Required Braces or Orthoses: No Restrictions Weight Bearing Restrictions: No Prior Functioning  Home Living Lives With: Alone Receives Help From: Family Type of Home: Apartment Home Layout: One level (on the second floor) Home Access: Stairs to enter Entrance Stairs-Rails: Doctor, general practice of Steps: 14 Bathroom Shower/Tub: Health visitor: Standard Home Adaptive Equipment: Shower chair with back;Walker - rolling;Wheelchair - manual;Bedside commode/3-in-1 Prior Function Level of Independence: Independent with basic ADLs;Needs assistance with homemaking;Independent with gait;Requires assistive device for independence (uses RW) Able to Take Stairs?: Yes Driving: No Comments: Pt's daughter drives pt to appointments and to the store.  Daughter takes care of meals and homemaking per daughter report. Daughter states that pt does not use RW inside apt as he shoudl. Cognition Cognition Arousal/Alertness: Awake/alert Overall Cognitive Status: Impaired Memory: Appears impaired Memory Deficits: ?intentional vs  memory--pt downplays his deficits/need for assist PTA; daughter frequently corrects him during interview re: prior status Orientation Level: Oriented to person;Oriented to place;Other (Comment) (time and situation not tested) Following Commands: Follows one step commands inconsistently;Other (comment) (  impulsive and likes to do things his way) Safety/Judgement: Decreased awareness of safety precautions;Decreased safety judgement for tasks assessed Decreased Safety/Judgement: Impulsive;Decreased awareness of need for assistance Safety/Judgement - Other Comments: repeatedly trying to stand from EOB despite cues to wait for therapist/lines to be ready Awareness of Deficits: Decreased awareness of deficits Sensation/Coordination Sensation Light Touch: Not tested (legs) Coordination Extremity Assessment RUE Assessment RUE Assessment: Within Functional Limits (4/5) LUE Assessment LUE Assessment: Within Functional Limits (3+/5) RLE Assessment RLE Assessment: Exceptions to [facility] RLE AROM (degrees) RLE Overall AROM Comments: grossly WFL RLE Strength RLE Overall Strength Comments: grossly 3+ to 4/5 LLE Assessment LLE Assessment: Exceptions to WFL LLE AROM (degrees) LLE Overall AROM Comments: grossly WFL LLE Strength LLE Overall Strength Comments: grossly 3+ to 4/5 Mobility (including Balance) Bed Mobility Bed Mobility: Yes Supine to Sit: 4: Min assist;HOB elevated (Comment degrees);With rails (HOB 50 degrees) Sitting - Scoot to Edge of Bed: 3: Mod assist Sitting - Scoot to Edge of Bed Details (indicate cue type and reason): assist to scoot R hip to EOB with use of pad Transfers Sit to Stand: 1: +2 Total assist;Patient percentage (comment);With upper extremity assist;From bed Sit to Stand Details (indicate cue type and reason): pt=70%, however impulsive and leverages himself to standing by pushing backs of legs against bed (strongly) to compensate for early hip/trunk extension with posterior  lean; unsafe use of RW/hand placement despite cuing Stand to Sit: 3: Mod assist;With upper extremity assist;With armrests;To bed;To chair/3-in-1 Stand to Sit Details: cueing for safe hand placement and to control descent Ambulation/Gait Ambulation/Gait: Yes Ambulation/Gait Assistance: 1: +2 Total assist;Patient percentage (comment) Ambulation/Gait Assistance Details (indicate cue type and reason): pt= 50%; leans posteriorly, shuffling gait, pushes RW too far ahead and then gets feet entangled with legs of RW during turns, maximal assistance to [employer] RW Ambulation Distance (Feet): 55 Feet Assistive device: Rolling walker Gait Pattern: Step-to pattern;Decreased stride length;Decreased hip/knee flexion - left;Decreased hip/knee flexion - right;Decreased weight shift to right;Decreased weight shift to left;Right foot flat;Left foot flat;Shuffle;Decreased trunk rotation;Trunk flexed  Balance Balance Assessed: Yes Static Sitting Balance Static Sitting - Balance Support: No upper extremity supported;Feet supported Static Sitting - Level of Assistance: 5: Stand by assistance Static Sitting - Comment/# of Minutes: for safety due to impulsivity Static Standing Balance Static Standing - Balance Support: Bilateral upper extremity supported Static Standing - Level of Assistance: 2: Max assist Static Standing - Comment/# of Minutes: holding RW leans posteriorly Exercise    End of Session PT - End of Session Equipment Utilized During Treatment: Gait belt Activity Tolerance: Patient tolerated treatment well Patient left: in chair;with call bell in reach;with family/visitor present Nurse Communication: Other (comment) (OT to discuss with RN) General Behavior During Session: Other (comment) (impulsive) Cognition: Impaired  Reo Portela 03/11/2012, 6:03 PM  Pager [phone]

## 2012-03-11 NOTE — Progress Notes (Signed)
Speech Language/Pathology Speech Language Pathology Evaluation Patient Details Name: Bradley [name] MRN: [id number] DOB: 05/07/1941 Today's Date: 03/11/2012  Problem List:  Patient Active Problem List  Diagnoses  . Stroke, small vessel  . Moderate aortic stenosis  . Diastolic dysfunction, left ventricle  . Hypertension  . H/O: CVA (cardiovascular accident)   Past Medical History:  Past Medical History  Diagnosis Date  . Stroke   . Hypertension   . Hyperlipidemia   . Aortic stenosis   . CHF (congestive heart failure)   . MGUS (monoclonal gammopathy of unknown significance)   . Goiter   . Rhabdomyolysis    Past Surgical History:  Past Surgical History  Procedure Date  . Circumcision     SLP Assessment/Plan/Recommendation Assessment Clinical Impression Statement: Pt presents with a moderate dysarthria with poor inteligibility at phrase and conversation level. Pt also presents with mild to moderate cognitive deficits in awareness, problem solving and processing multistep/complex information. Dtr reports pt had these deficits at baseline though pt was living alone prior to admit. Question pts safety living alone at this point. Would recommend acute SLP for intelligibiilty and further assessment of cognition as well as SNF for rehab at d/c if needed as dtr reports she is unable to provide full supervision  SLP Recommendation/Assessment: Patient will need skilled Speech Lanaguage Pathology Services in the acute care venue to address identified deficits Problem List: Auditory comprehension;Memory;Problem Solving;Executive Functioning;Verbal expression Therapy Diagnosis: Dysarthria;Cognitive Impairments Plan Speech Therapy Frequency: min 2x/week Duration: 2 weeks Treatment/Interventions: Cueing hierarchy;Oral motor exercises;Functional tasks;SLP instruction and feedback;Compensatory strategies;Patient/family education Potential to Achieve Goals: Good SLP Recommendations Follow up  Recommendations: Skilled Nursing facility Individuals Consulted Consulted and Agree with Results and Recommendations: Family member/caregiver;Patient Family Member Consulted : daughter  SLP Goals  SLP Goals Potential to Achieve Goals: Good SLP Goal #1: Pt will utilize compensatory strategies for clear speech at phrase level with max multimodal cues.  SLP Goal #2: Pt will complete basic functional problem solving tasks with min assist SLP Goal #3: Pt will demonstrate emergent awareness of deficits with moderate verbal cues.   Harlon Ditty, MA CCC-SLP [phone] Claudine Mouton 03/11/2012, 3:09 PM

## 2012-03-11 NOTE — Progress Notes (Signed)
Clinical Social Work Department BRIEF PSYCHOSOCIAL ASSESSMENT 03/11/2012  Patient:  Bradley [name], Bradley [name]     Account Number:  [id number]     Admit date:  03/10/2012  Clinical Social Worker:  Margaree Mackintosh  Date/Time:  03/11/2012 04:13 PM  Referred by:  Physician  Date Referred:  03/11/2012 Referred for  SNF Placement   Other Referral:   Interview type:  Patient Other interview type:   with family-Dtr and wife    PSYCHOSOCIAL DATA Living Status:  WIFE Admitted from facility:   Level of care:   Primary support name:  dtr Primary support relationship to patient:   Degree of support available:   adequate    CURRENT CONCERNS Current Concerns  Post-Acute Placement   Other Concerns:    SOCIAL WORK ASSESSMENT / PLAN CSW met with pt in room; wife and dtr were present.  CSW introduced self and explained role.  CSW confirmed plan for SNF.  CSW provided opportunity for pt and family to express concenrs and/or quesitons.  CSW to submit paperwork to [facility] and follow up with family.   Assessment/plan status:  Psychosocial Support/Ongoing Assessment of Needs Other assessment/ plan:   Information/referral to community resources:    PATIENT'S/FAMILY'S RESPONSE TO PLAN OF CARE: Pt and family are agreeable to SNF serach.    Angelia Mould, MSW, [place] [phone]

## 2012-03-11 NOTE — Progress Notes (Signed)
Stroke Team Progress Note  HISTORY Mr. Bradley Bradley [name] is a 71 y.o. Bradley [name] with a history of prior strokes who presents 03/10/12 due to left sided weakness and slurred speech. The patient states this started a couple of days ago and started with weakness in his left leg to th epoint that it made it difficult for him to stand up and walk. He the noticed some numbness in his hand on the left side. Then this morning his speech was much worse and he was slurring his words. When his family checked on him and found this, they brought him to the ER for evaluation. While in the ED he had an MRI that showed an acute R BG/Internal capsule infarct. He was not a t-PA candidate secondary to delay in arrival. He was admitted for further evaluation.  SUBJECTIVE Dysarthria improved slightly and mild left hand weakness persists. Prior h/o stroke +.Daughter states symptoms began y`day.he was on aspirin which was DC 6 months ago.  OBJECTIVE Filed Vitals:   03/11/12 0158 03/11/12 0200 03/11/12 0613 03/11/12 0827  BP: 198/79  154/71 166/75  Pulse: 63  70 70  Temp:  98.9 F (37.2 C) 99 F (37.2 C) 99 F (37.2 C)  TempSrc:   Oral   Resp: 19  19 20   Height:  5\' 5"  (1.651 m)    Weight:  66.6 kg (146 lb 13.2 oz)    SpO2: 96%  97% 98%    CBG (last 3) No results found for this basename: GLUCAP:3 in the last 72 hours Intake/Output from previous day: 03/13 0701 - 03/14 0700 In: -  Out: 400 [Urine:400]  IV Fluid Intake:     . sodium chloride 100 mL/hr at 03/11/12 0230  . DISCONTD: sodium chloride 1,000 mL (03/10/12 1941)   Medications    . aspirin  300 mg Rectal Daily  . enoxaparin  40 mg Subcutaneous Daily  . furosemide  20 mg Oral BID  . irbesartan  300 mg Oral Daily  . sodium chloride  500 mL Intravenous Once  . sodium chloride  3 mL Intravenous Q12H  . Tamsulosin HCl  0.4 mg Oral Daily  PRN acetaminophen  Diet:  Cardiac thin liquids Activity:   Up with assistance DVT Prophylaxis:  Lovenox 40 mg sq  daily   Significant Diagnostic Studies: CBC    Component Value Date/Time   WBC 5.1 03/11/2012 0655   RBC 4.29 03/11/2012 0655   HGB 12.2* 03/11/2012 0655   HCT 37.3* 03/11/2012 0655   PLT 173 03/11/2012 0655   MCV 86.9 03/11/2012 0655   MCH 28.4 03/11/2012 0655   MCHC 32.7 03/11/2012 0655   RDW 14.0 03/11/2012 0655   LYMPHSABS 0.9 03/10/2012 1927   MONOABS 0.6 03/10/2012 1927   EOSABS 0.0 03/10/2012 1927   BASOSABS 0.0 03/10/2012 1927   CMP    Component Value Date/Time   NA 139 03/10/2012 1927   K 4.0 03/10/2012 1927   CL 103 03/10/2012 1927   CO2 23 03/10/2012 1927   GLUCOSE 88 03/10/2012 1927   BUN 23 03/10/2012 1927   CREATININE 0.93 03/11/2012 0655   CALCIUM 9.7 03/10/2012 1927   PROT 6.5 02/28/2011 0503   ALBUMIN 3.0* 02/28/2011 0503   AST 67* 02/28/2011 0503   ALT 24 02/28/2011 0503   ALKPHOS 44 02/28/2011 0503   BILITOT 0.8 02/28/2011 0503   GFRNONAA 83* 03/11/2012 0655   GFRAA >90 03/11/2012 0655   COAGS Lab Results  Component Value Date  INR 1.12 02/28/2011   INR 1.21 02/27/2011   INR 1.07 02/27/2011   Lipid Panel    Component Value Date/Time   CHOL 198 03/11/2012 0655   TRIG 79 03/11/2012 0655   HDL 54 03/11/2012 0655   CHOLHDL 3.7 03/11/2012 0655   VLDL 16 03/11/2012 0655   LDLCALC 128* 03/11/2012 0655   HgbA1C  No results found for this basename: HGBA1C   Urine Drug Screen     Component Value Date/Time   LABOPIA NONE DETECTED 02/27/2011 0520   COCAINSCRNUR NONE DETECTED 02/27/2011 0520   LABBENZ NONE DETECTED 02/27/2011 0520   AMPHETMU NONE DETECTED 02/27/2011 0520   THCU NONE DETECTED 02/27/2011 0520   LABBARB  Value: NONE DETECTED        DRUG SCREEN FOR MEDICAL PURPOSES ONLY.  IF CONFIRMATION IS NEEDED FOR ANY PURPOSE, NOTIFY LAB WITHIN 5 DAYS.        LOWEST DETECTABLE LIMITS FOR URINE DRUG SCREEN Drug Class       Cutoff (ng/mL) Amphetamine      1000 Barbiturate      200 Benzodiazepine   200 Tricyclics       300 Opiates          300 Cocaine          300 THC              50 02/27/2011 0520      Alcohol Level No results found for this basename: eth     TROPONIN I     Status: Normal   Collection Time   03/10/12  7:27 PM      Component Value Range   Troponin I <0.30  <0.30 (ng/mL)  CK TOTAL AND CKMB     Status: Abnormal   Collection Time   03/10/12  7:27 PM      Component Value Range   Total CK 1388 (*) 7 - 232 (U/L)   CK, MB 4.1 (*) 0.3 - 4.0 (ng/mL)   Relative Index 0.3  0.0 - 2.5   URINALYSIS, ROUTINE W REFLEX MICROSCOPIC     Status: Abnormal   Collection Time   03/10/12 10:37 PM      Component Value Range   Color, Urine YELLOW  YELLOW    APPearance CLEAR  CLEAR    Specific Gravity, Urine 1.017  1.005 - 1.030    pH 5.5  5.0 - 8.0    Glucose, UA NEGATIVE  NEGATIVE (mg/dL)   Hgb urine dipstick NEGATIVE  NEGATIVE    Bilirubin Urine NEGATIVE  NEGATIVE    Ketones, ur TRACE (*) NEGATIVE (mg/dL)   Protein, ur 960 (*) NEGATIVE (mg/dL)   Urobilinogen, UA 0.2  0.0 - 1.0 (mg/dL)   Nitrite NEGATIVE  NEGATIVE    Leukocytes, UA TRACE (*) NEGATIVE   URINE MICROSCOPIC-ADD ON     Status: Abnormal   Collection Time   03/10/12 10:37 PM      Component Value Range   WBC, UA 0-2  <3 (WBC/hpf)   Bacteria, UA MANY (*) RARE   CARDIAC PANEL(CRET KIN+CKTOT+MB+TROPI)     Status: Abnormal   Collection Time   03/11/12  6:55 AM      Component Value Range   Total CK 1058 (*) 7 - 232 (U/L)   CK, MB 3.1  0.3 - 4.0 (ng/mL)   Troponin I <0.30  <0.30 (ng/mL)   Relative Index 0.3  0.0 - 2.5    CT of the brain  Not ordered  CT angio  Not ordered   MRI of the brain  03/10/12 7 mm acute infarction in the right basal ganglia/posterior limb internal capsule.  Extensive chronic small vessel changes elsewhere throughout the brain.  Old left occipital cortical and subcortical infarction.   MRA of the brain  Not ordered   2D Echocardiogram  ordered   Carotid Doppler  ordered   CXR  03/10/12  1.  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 2.  No acute or superimposed abnormality.   EKG  normal sinus rhythm.   Physical Exam   Pleasant elderly [place] Bradley [name] not in distress.Afebrile. Neck supple no bruit. Cardiac exam no murmur or gallop. Lungs clear to auscultation. Neurological exam Awake alert oriented x 3 normal   Language but moderate dysarthria present. Mild left lower face asymmetry. Tongue midline. No drift. Mild diminished fine finger movements on left. Orbits right over left upper extremity. Mild left grip weak.. 4/5 left hemiparesis. Normal sensation . Normal coordination.  ASSESSMENT Mr. Bradley Bradley [name] is a 71 y.o. Bradley [name] with a right BG/PLIC infarct, secondary to likely small vessel disease, workup underway. On aspirin 300 mg rectally every day for secondary stroke prevention.  Hospital day # 1  TREATMENT/PLAN -Change to  aspirin 325 mg orally every day for secondary stroke prevention with negative swallow screen -complete stroke workup -TCD to look at intracranial vessels  -therapy evals -cancel TTE with bubble, do TTE alone (done) -D/w daughter and answered questions.   03/11/2012 9:08 AM

## 2012-03-12 LAB — URINE CULTURE
Colony Count: >=100000
Culture  Setup Time: 201303140220

## 2012-03-12 MED ORDER — ASPIRIN 325 MG PO TABS: 325.0000 mg | ORAL_TABLET | Freq: Every day | ORAL | Status: AC

## 2012-03-12 MED ORDER — CLONIDINE HCL 0.3 MG/24HR TD PTWK
0.3000 mg | MEDICATED_PATCH | TRANSDERMAL | Status: DC
  Filled 2012-03-12: qty 1

## 2012-03-12 MED ORDER — LABETALOL HCL 300 MG PO TABS
600.0000 mg | ORAL_TABLET | Freq: Two times a day (BID) | ORAL | Status: DC
  Administered 2012-03-12 (×2): 600 mg via ORAL
  Filled 2012-03-12 (×4): qty 2

## 2012-03-12 MED ORDER — AMLODIPINE BESYLATE 10 MG PO TABS
10.0000 mg | ORAL_TABLET | Freq: Every day | ORAL | Status: DC
  Administered 2012-03-12: 10 mg via ORAL
  Filled 2012-03-12 (×2): qty 1

## 2012-03-12 NOTE — Progress Notes (Addendum)
Subjective: No complaints.   Objective: Vital signs in last 24 hours: Temp:  [97.8 F (36.6 C)-99.2 F (37.3 C)] 97.8 F (36.6 C) (03/15 0712) Pulse Rate:  [60-76] 76  (03/15 0712) Resp:  [16-20] 18  (03/15 0712) BP: (120-222)/(78-108) 120/78 mmHg (03/15 0721) SpO2:  [92 %-98 %] 95 % (03/15 0712) Weight change:  Last BM Date: 03/07/12  Intake/Output from previous day: 03/14 0701 - 03/15 0700 In: 360 [P.O.:360] Out: 3750 [Urine:3750]     Physical Exam: General: Alert, awake, oriented x3, in no acute distress. HEENT: No bruits, no goiter. Heart: Regular rate and rhythm, strong systolic ejection murmur. Lungs: Clear to auscultation bilaterally. Abdomen: Soft, nontender, nondistended, positive bowel sounds. Extremities: No clubbing cyanosis or edema with positive pedal pulses.    Lab Results: Basic Metabolic Panel:  Basename 03/11/12 0655 03/10/12 1927  NA -- 139  K -- 4.0  CL -- 103  CO2 -- 23  GLUCOSE -- 88  BUN -- 23  CREATININE 0.93 0.96  CALCIUM -- 9.7  MG -- --  PHOS -- --   CBC:  Basename 03/11/12 0655 03/10/12 1927  WBC 5.1 8.7  NEUTROABS -- 7.1  HGB 12.2* 12.9*  HCT 37.3* 38.3*  MCV 86.9 86.8  PLT 173 177   Cardiac Enzymes:  Basename 03/11/12 2038 03/11/12 1340 03/11/12 0655  CKTOTAL 1076* 1000* 1058*  CKMB 3.1 2.8 3.1  CKMBINDEX -- -- --  TROPONINI <0.30 <0.30 <0.30   Hemoglobin A1C:  Basename 03/11/12 0655  HGBA1C 5.5   Fasting Lipid Panel:  Basename 03/11/12 0655  CHOL 198  HDL 54  LDLCALC 128*  TRIG 79  CHOLHDL 3.7  LDLDIRECT --   Urine Drug Screen: Drugs of Abuse     Component Value Date/Time   LABOPIA NONE DETECTED 02/27/2011 0520   COCAINSCRNUR NONE DETECTED 02/27/2011 0520   LABBENZ NONE DETECTED 02/27/2011 0520   AMPHETMU NONE DETECTED 02/27/2011 0520   THCU NONE DETECTED 02/27/2011 0520   LABBARB  Value: NONE DETECTED        DRUG SCREEN FOR MEDICAL PURPOSES ONLY.  IF CONFIRMATION IS NEEDED FOR ANY PURPOSE, NOTIFY LAB  WITHIN 5 DAYS.        LOWEST DETECTABLE LIMITS FOR URINE DRUG SCREEN Drug Class       Cutoff (ng/mL) Amphetamine      1000 Barbiturate      200 Benzodiazepine   200 Tricyclics       300 Opiates          300 Cocaine          300 THC              50 02/27/2011 0520    Urinalysis:  Basename 03/10/12 2237  COLORURINE YELLOW  LABSPEC 1.017  PHURINE 5.5  GLUCOSEU NEGATIVE  HGBUR NEGATIVE  BILIRUBINUR NEGATIVE  KETONESUR TRACE*  PROTEINUR 100*  UROBILINOGEN 0.2  NITRITE NEGATIVE  LEUKOCYTESUR TRACE*    Studies/Results: Dg Chest 2 View  03/10/2012  *RADIOLOGY REPORT*  Clinical Data: CHF, slurred speech  CHEST - 2 VIEW  Comparison: 02/26/2011  Findings: There is rightward deviation of the trachea at the level of the thoracic inlet as previously noted.  Minimal linear scarring or subsegmental atelectasis in the lingula.  Right lung clear. Stable mild cardiomegaly.  Atheromatous aortic arch.  No effusion. Minimal spurring in the mid thoracic spine.  IMPRESSION:  1.  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 2.  No acute or superimposed abnormality.  Original Report Authenticated By: Osa Craver, M.D.   Mr Brain Wo Contrast  03/10/2012  *RADIOLOGY REPORT*  Clinical Data: Aphasia.  Weakness.  Fall.  MRI HEAD WITHOUT CONTRAST  Technique:  Multiplanar, multiecho pulse sequences of the brain and surrounding structures were obtained according to standard protocol without intravenous contrast.  Comparison: Head CT 02/26/2011  Findings: There is a 7 mm focus of acute infarction within the basal ganglia/posterior limb internal capsule on the right.  No other acute infarction.  The brainstem is unremarkable.  The cerebellum shows generalized atrophy.  The cerebral hemispheres show old small vessel infarctions within the basal ganglia, thalami and throughout the hemispheric deep white matter. There is an old cortical and subcortical infarction in the left  occipital lobe.  No mass lesion, hemorrhage, hydrocephalus or extra-axial collection. No pituitary mass.  No inflammatory sinus disease.  No skull or skull base lesion.  IMPRESSION: 7 mm acute infarction in the right basal ganglia/posterior limb internal capsule.  Extensive chronic small vessel changes elsewhere throughout the brain.  Old left occipital cortical and subcortical infarction.  Original Report Authenticated By: Thomasenia Sales, M.D.    Medications: Scheduled Meds:    . amLODipine  10 mg Oral Daily  . aspirin  325 mg Oral Daily  . cloNIDine  0.3 mg Transdermal Weekly  . enoxaparin  40 mg Subcutaneous Daily  . furosemide  20 mg Oral BID  . irbesartan  300 mg Oral Daily  . labetalol  600 mg Oral BID  . simvastatin  20 mg Oral q1800  . sodium chloride  3 mL Intravenous Q12H  . Tamsulosin HCl  0.4 mg Oral Daily   Continuous Infusions:    . sodium chloride 100 mL/hr at 03/11/12 0230   PRN Meds:.acetaminophen, hydrALAZINE  Assessment/Plan:  Principal Problem:  *Stroke, small vessel Active Problems:  Moderate aortic stenosis  Diastolic dysfunction, left ventricle  Hypertension  H/O: CVA (cardiovascular accident)   #1 Right Brain CVA: Had a prior CVA but had not been taking ASA. Is back on ASA 325 mg daily for secondary stroke prevention. ECHO/dopplers negative for source of CVA. 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 Will need SNF for rehab. Family aware and in agreement. Will alert SW. LDL 128, on statin. Aggressive Risk factor modification is key.  #2 HTN: BP extremely elevated in the range of 220/100s. Will start back on PO home meds including norvasc, clonidine, labetalol. Follow. Try to keep SBP around 150-160 given recent CVA.  #3 Dispo: awaiting SNF D/C. Per SW possibly tomorrow.     LOS: 2 days   [facility] Pager: [phone] 03/12/2012, 9:31 AM

## 2012-03-12 NOTE — Progress Notes (Signed)
Stroke Team Progress Note  HISTORY [name] is a 71 y.o. male with a history of prior strokes who presents 03/10/12 due to left sided weakness and slurred speech. The patient states this started a couple of days ago and started with weakness in his left leg to th epoint that it made it difficult for him to stand up and walk. He the noticed some numbness in his hand on the left side. Then this morning his speech was much worse and he was slurring his words. When his family checked on him and found this, they brought him to the ER for evaluation. While in the ED he had an MRI that showed an acute R BG/Internal capsule infarct. He was not a t-PA candidate secondary to delay in arrival. He was admitted for further evaluation.  SUBJECTIVE  Stable. Dysarthria and left sided weakness improving. Plan to go to snf as lives alone. OBJECTIVE Filed Vitals:   03/11/12 1936 03/11/12 2249 03/12/12 0712 03/12/12 0721  BP: 176/90 209/100 222/108 120/78  Pulse:  70 76   Temp:  98.5 F (36.9 C) 97.8 F (36.6 C)   TempSrc:  Oral Oral   Resp:  16 18   Height:      Weight:      SpO2:  98% 95%     CBG (last 3) No results found for this basename: GLUCAP:3 in the last 72 hours Intake/Output from previous day: 03/14 0701 - 03/15 0700 In: 360 [P.O.:360] Out: 3050 [Urine:3050]  IV Fluid Intake:     . sodium chloride 100 mL/hr at 03/11/12 0230   Medications    . aspirin  325 mg Oral Daily  . enoxaparin  40 mg Subcutaneous Daily  . furosemide  20 mg Oral BID  . irbesartan  300 mg Oral Daily  . simvastatin  20 mg Oral q1800  . sodium chloride  3 mL Intravenous Q12H  . Tamsulosin HCl  0.4 mg Oral Daily  . DISCONTD: aspirin  300 mg Rectal Daily  PRN acetaminophen, hydrALAZINE  Diet:  Cardiac thin liquids Activity:   Up with assistance DVT Prophylaxis:  Lovenox 40 mg sq daily   Significant Diagnostic Studies: CBC    Component Value Date/Time   WBC 5.1 03/11/2012 0655   RBC 4.29 03/11/2012  0655   HGB 12.2* 03/11/2012 0655   HCT 37.3* 03/11/2012 0655   PLT 173 03/11/2012 0655   MCV 86.9 03/11/2012 0655   MCH 28.4 03/11/2012 0655   MCHC 32.7 03/11/2012 0655   RDW 14.0 03/11/2012 0655   LYMPHSABS 0.9 03/10/2012 1927   MONOABS 0.6 03/10/2012 1927   EOSABS 0.0 03/10/2012 1927   BASOSABS 0.0 03/10/2012 1927   CMP    Component Value Date/Time   NA 139 03/10/2012 1927   K 4.0 03/10/2012 1927   CL 103 03/10/2012 1927   CO2 23 03/10/2012 1927   GLUCOSE 88 03/10/2012 1927   BUN 23 03/10/2012 1927   CREATININE 0.93 03/11/2012 0655   CALCIUM 9.7 03/10/2012 1927   PROT 6.5 02/28/2011 0503   ALBUMIN 3.0* 02/28/2011 0503   AST 67* 02/28/2011 0503   ALT 24 02/28/2011 0503   ALKPHOS 44 02/28/2011 0503   BILITOT 0.8 02/28/2011 0503   GFRNONAA 83* 03/11/2012 0655   GFRAA >90 03/11/2012 0655   COAGS Lab Results  Component Value Date   INR 1.12 02/28/2011   INR 1.21 02/27/2011   INR 1.07 02/27/2011   Lipid Panel    Component Value  Date/Time   CHOL 198 03/11/2012 0655   TRIG 79 03/11/2012 0655   HDL 54 03/11/2012 0655   CHOLHDL 3.7 03/11/2012 0655   VLDL 16 03/11/2012 0655   LDLCALC 128* 03/11/2012 0655   HgbA1C  Lab Results  Component Value Date   HGBA1C 5.5 03/11/2012   Urine Drug Screen     Component Value Date/Time   LABOPIA NONE DETECTED 02/27/2011 0520   COCAINSCRNUR NONE DETECTED 02/27/2011 0520   LABBENZ NONE DETECTED 02/27/2011 0520   AMPHETMU NONE DETECTED 02/27/2011 0520   THCU NONE DETECTED 02/27/2011 0520   LABBARB  Value: NONE DETECTED        DRUG SCREEN FOR MEDICAL PURPOSES ONLY.  IF CONFIRMATION IS NEEDED FOR ANY PURPOSE, NOTIFY LAB WITHIN 5 DAYS.        LOWEST DETECTABLE LIMITS FOR URINE DRUG SCREEN Drug Class       Cutoff (ng/mL) Amphetamine      1000 Barbiturate      200 Benzodiazepine   200 Tricyclics       300 Opiates          300 Cocaine          300 THC              50 02/27/2011 0520    Alcohol Level No results found for this basename: eth     TROPONIN I     Status: Normal   Collection  Time   03/10/12  7:27 PM      Component Value Range   Troponin I <0.30  <0.30 (ng/mL)  CK TOTAL AND CKMB     Status: Abnormal   Collection Time   03/10/12  7:27 PM      Component Value Range   Total CK 1388 (*) 7 - 232 (U/L)   CK, MB 4.1 (*) 0.3 - 4.0 (ng/mL)   Relative Index 0.3  0.0 - 2.5   URINALYSIS, ROUTINE W REFLEX MICROSCOPIC     Status: Abnormal   Collection Time   03/10/12 10:37 PM      Component Value Range   Color, Urine YELLOW  YELLOW    APPearance CLEAR  CLEAR    Specific Gravity, Urine 1.017  1.005 - 1.030    pH 5.5  5.0 - 8.0    Glucose, UA NEGATIVE  NEGATIVE (mg/dL)   Hgb urine dipstick NEGATIVE  NEGATIVE    Bilirubin Urine NEGATIVE  NEGATIVE    Ketones, ur TRACE (*) NEGATIVE (mg/dL)   Protein, ur 161 (*) NEGATIVE (mg/dL)   Urobilinogen, UA 0.2  0.0 - 1.0 (mg/dL)   Nitrite NEGATIVE  NEGATIVE    Leukocytes, UA TRACE (*) NEGATIVE   URINE MICROSCOPIC-ADD ON     Status: Abnormal   Collection Time   03/10/12 10:37 PM      Component Value Range   WBC, UA 0-2  <3 (WBC/hpf)   Bacteria, UA MANY (*) RARE   CARDIAC PANEL(CRET KIN+CKTOT+MB+TROPI)     Status: Abnormal   Collection Time   03/11/12  6:55 AM      Component Value Range   Total CK 1058 (*) 7 - 232 (U/L)   CK, MB 3.1  0.3 - 4.0 (ng/mL)   Troponin I <0.30  <0.30 (ng/mL)   Relative Index 0.3  0.0 - 2.5    CT of the brain  Not ordered   CT angio  Not ordered   MRI of the brain  03/10/12 7 mm acute infarction in the  right basal ganglia/posterior limb internal capsule.  Extensive chronic small vessel changes elsewhere throughout the brain.  Old left occipital cortical and subcortical infarction.   MRA of the brain  Not ordered   2D Echocardiogram  EF 55-60% with no source of embolus.   Carotid Doppler  Preliminary report: There is no ICA stenosis on the right. There is left 40-59% ICA stenosis, highest end of scale. Bilateral vertebral artery flow is antegrade.  TCD completed  CXR  03/10/12  1.  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 2.  No acute or superimposed abnormality.   EKG  normal sinus rhythm.   Physical Exam   Pleasant elderly [place] male not in distress.Afebrile. Neck supple no bruit. Cardiac exam no murmur or gallop. Lungs clear to auscultation. Neurological exam Awake alert oriented x 3 normal   Language but moderate dysarthria present. Mild left lower face asymmetry. Tongue midline. No drift. Mild diminished fine finger movements on left. Orbits right over left upper extremity. Mild left grip weak.. 4/5 left hemiparesis. Normal sensation . Normal coordination.   ASSESSMENT [name] is a 71 y.o. 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 On aspirin 325 mg orally every day for secondary stroke prevention.  -incidental L ICA stenosis 40-59% -hypertension -hyperlipidemia  Hospital day # 2  TREATMENT/PLAN -SNF placement -f/u TCD results.F/U as outpatient 2 months. D/W daughter. -Stroke Service will sign off. Follow up with Dr. Pearlean Brownie in 2 months.  03/12/2012 7:27 AM

## 2012-03-12 NOTE — Discharge Summary (Signed)
Physician Discharge Summary  Patient ID: Bradley [name] MRN: [id number] DOB/AGE: 1940-12-31 71 y.o.  Admit date: 03/10/2012 Discharge date: 03/12/2012  Primary Care Physician:  No primary provider on file.   Discharge Diagnoses:    Principal Problem:  *Stroke, small vessel Active Problems:  Moderate aortic stenosis  Diastolic dysfunction, left ventricle  Hypertension  H/O: CVA (cardiovascular accident)    Medication List  As of 03/12/2012  2:29 PM   STOP taking these medications         hydrALAZINE 50 MG tablet         TAKE these medications         amLODipine 10 MG tablet   Commonly known as: NORVASC   Take 10 mg by mouth daily.      aspirin 325 MG tablet   Take 1 tablet (325 mg total) by mouth daily.      cloNIDine 0.3 mg/24hr   Commonly known as: CATAPRES - Dosed in mg/24 hr   Place 1 patch onto the skin once a week.      furosemide 40 MG tablet   Commonly known as: LASIX   Take 20 mg by mouth 2 (two) times daily.      irbesartan 300 MG tablet   Commonly known as: AVAPRO   Take 300 mg by mouth daily.      labetalol 300 MG tablet   Commonly known as: NORMODYNE   Take 600 mg by mouth 2 (two) times daily.      pravastatin 10 MG tablet   Commonly known as: PRAVACHOL   Take 10 mg by mouth daily.      Tamsulosin HCl 0.4 MG Caps   Commonly known as: FLOMAX   Take 0.4 mg by mouth daily.             Disposition and Follow-up:  To be discharged to SNF on 3/16 for rehab purposes.   Consults:  neurology Dr. Pearlean Brownie.   Significant Diagnostic Studies:  Dg Chest 2 View  03/10/2012  *RADIOLOGY REPORT*  Clinical Data: CHF, slurred speech  CHEST - 2 VIEW  Comparison: 02/26/2011  Findings: There is rightward deviation of the trachea at the level of the thoracic inlet as previously noted.  Minimal linear scarring or subsegmental atelectasis in the lingula.  Right lung clear. Stable mild cardiomegaly.  Atheromatous aortic arch.  No effusion. Minimal spurring in the  mid thoracic spine.  IMPRESSION:  1.  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 2.  No acute or superimposed abnormality.  Original Report Authenticated By: Osa Craver, M.D.   Mr Brain Wo Contrast  03/10/2012  *RADIOLOGY REPORT*  Clinical Data: Aphasia.  Weakness.  Fall.  MRI HEAD WITHOUT CONTRAST  Technique:  Multiplanar, multiecho pulse sequences of the brain and surrounding structures were obtained according to standard protocol without intravenous contrast.  Comparison: Head CT 02/26/2011  Findings: There is a 7 mm focus of acute infarction within the basal ganglia/posterior limb internal capsule on the right.  No other acute infarction.  The brainstem is unremarkable.  The cerebellum shows generalized atrophy.  The cerebral hemispheres show old small vessel infarctions within the basal ganglia, thalami and throughout the hemispheric deep white matter. There is an old cortical and subcortical infarction in the left occipital lobe.  No mass lesion, hemorrhage, hydrocephalus or extra-axial collection. No pituitary mass.  No inflammatory sinus disease.  No skull or skull base lesion.  IMPRESSION:  7 mm acute infarction in the right basal ganglia/posterior limb internal capsule.  Extensive chronic small vessel changes elsewhere throughout the brain.  Old left occipital cortical and subcortical infarction.  Original Report Authenticated By: Thomasenia Sales, M.D.    Brief H and P: For complete details please refer to admission H and P, but in brief patient is a 71 y/o AA male with hx of uncontrolled HTN, moderate AS and grade 1 diastolic dysfunction per echo in 2012, BPH, , hx of stroke in past without residual deficit was brought in by family after having slurry speech. As per patient he woke up around 7-730 in am and while trying to walk around slumped in the ground and simply couldn't get up. His family saw him later during the day and notice him to have  slurry speech. patient denies any weakness , dizziness, chest pain , palpitations, blurring of vision, headache, tingling or numbness of limbs. He denies passing out or being dizzy.      Hospital Course:  Principal Problem:  *Stroke, small vessel Active Problems:  Moderate aortic stenosis  Diastolic dysfunction, left ventricle  Hypertension  H/O: CVA (cardiovascular accident)   #1 Right Brain CVA: Had a prior CVA but had not been taking ASA. Is back on ASA 325 mg daily for secondary stroke prevention. ECHO/dopplers negative for source of CVA. 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 Will need SNF for rehab. Family aware and in agreement.  LDL 128, on statin. Aggressive Risk factor modification is key.   #2 HTN: BP extremely elevated in the range of 220/100s. Will start back on PO home meds including norvasc, clonidine, labetalol. Follow. Try to keep SBP around 150-160 given recent CVA.   #3 Dispo: awaiting SNF D/C. Per SW possibly tomorrow 316.   Time spent on Discharge: Greater than 30 minutes.  SignedChaya Jan [facility] Pager: [phone] 03/12/2012, 2:29 PM

## 2012-03-12 NOTE — Clinical Documentation Improvement (Signed)
CHF DOCUMENTATION CLARIFICATION QUERY  THIS DOCUMENT IS NOT A PERMANENT PART OF THE MEDICAL RECORD  TO RESPOND TO THE THIS QUERY, FOLLOW THE INSTRUCTIONS BELOW:  1. If needed, update documentation for the patient's encounter via the notes activity.  2. Access this query again and click edit on the In [employer].  3. After updating, or not, click F2 to complete all highlighted (required) fields concerning your review. Select "additional documentation in the medical record" OR "no additional documentation provided".  4. Click Sign note button.  5. The deficiency will fall out of your In Basket *Please let us know if you are not able to complete this workflow by phone or e-mail (listed below).  Please update your documentation within the medical record to reflect your response to this query.                                                                                    03/12/12  Dear Dr.   Jeanella Flattery / Associates,  In a better effort to capture your patient's severity of illness, reflect appropriate length of stay and utilization of resources, a review of the patient medical record has revealed the following indicators the diagnosis of Heart Failure.    Based on your clinical judgment, please clarify and document in a progress note and/or discharge summary the clinical condition associated with the following supporting information:  In responding to this query please exercise your independent judgment.  The fact that a query is asked, does not imply that any particular answer is desired or expected.  Per H +P "diastolic dysfunction, left ventricle"  , "chf" "clinically euvolemic"  "cont lasix"  , please clarify the type of chf and acuity if known.  Thank you    Possible Clinical Conditions?  Chronic Diastolic Congestive Heart Failure  Chronic Systolic & Diastolic Congestive Heart Failure  Acute Diastolic Congestive Heart Failure  Acute Systolic & Diastolic Congestive  Heart Failure  Acute on Chronic Diastolic Congestive Heart Failure  Acute on Chronic Systolic & Diastolic  Congestive Heart Failure  Other Condition________________________________________  Cannot Clinically Determine    Risk Factors: htn, 2nd cva, aortic stenosis, hl  Diagnostics: Echo 03/11/12  EF 60-65%  Treatment: Lasix 20 mg po bid  Reviewed: chart coded, no response to query  Thank You,  Leonette Most Brice Potteiger  Clinical Documentation Specialist RN, BSN:  Pager [phone] HIM off [phone]  Health Information Management [place]

## 2012-03-12 NOTE — Clinical Documentation Improvement (Signed)
Abnormal Labs Clarification  THIS DOCUMENT IS NOT A PERMANENT PART OF THE MEDICAL RECORD  TO RESPOND TO THE THIS QUERY, FOLLOW THE INSTRUCTIONS BELOW:  1. If needed, update documentation for the patient's encounter via the notes activity.  2. Access this query again and click edit on the [employer].  3. After updating, or not, click F2 to complete all highlighted (required) fields concerning your review. Select "additional documentation in the medical record" OR "no additional documentation provided".  4. Click Sign note button.  5. The deficiency will fall out of your InBasket *Please let us know if you are not able to complete this workflow by phone or e-mail (listed below).  Please update your documentation within the medical record to reflect your response to this query.                                                                                   03/12/12  Dear Dr.  Jeanella Flattery Marton Redwood  In a better effort to capture your patient's severity of illness, reflect appropriate length of stay and utilization of resources, a review of the medical record has revealed the following indicators.    Based on your clinical judgment, please clarify and document in a progress note and/or discharge summary the clinical condition associated with the following supporting information:  In responding to this query please exercise your independent judgment.  The fact that a query is asked, does not imply that any particular answer is desired or expected.  Abnormal findings (laboratory, x-ray, pathologic, and other diagnostic results) are not coded and reported unless the physician indicates their clinical significance.   The medical record reflects the following clinical findings, please clarify the diagnostic and/or clinical significance:     Noted urine culture  growing >= 100,000 col/ml of enterococcus species with sensitivity report pending.  Please clarify the appropriate  secondary diagnosis for lab values.  Thank you     Possible Clinical Conditions?                                  UTI  Bacturia  Other   Condition                Cannot Clinically Determine   Reviewed: additional documentation in the medical record on 3/16 prog note answered by Dr. Theadora Rama D. Hongalgi   Thank You,  Leonette Most Selia Wareing  Clinical Documentation Specialist RN, BSN:  Pager [phone] HIM off [phone]  Health Information Management [place]

## 2012-03-12 NOTE — Progress Notes (Signed)
Clinical Social Worker submitted appropriate paperwork to SNF.  CSW to provide handoff information to weekend CSW to arrange dc to SNF [facility]) on Saturday.     Angelia Mould, MSW, [place] [phone]

## 2012-03-13 DIAGNOSIS — I472 Ventricular tachycardia: Secondary | ICD-10-CM

## 2012-03-13 NOTE — Consult Note (Signed)
CARDIOLOGY CONSULT NOTE  Patient ID: Bradley [name] MRN: [id number] DOB/AGE: [birth year] 70 y.o.  Admit date: 03/10/2012 Referring Physician:  [facility] Primary Physician: No primary provider on file. Primary Cardiologist:  Dietrich Pates Reason for Consultation: NSVT  Principal Problem:  *Stroke, small vessel Active Problems:  Moderate aortic stenosis  Diastolic dysfunction, left ventricle  Hypertension  H/O: CVA (cardiovascular accident)   HPI: 71 y/o AA male with hx of uncontrolled HTN, moderate AS and grade 1 diastolic dysfunction per echo in 2012, BPH, , hx of stroke in past without residual deficit was brought in by family after having slurry speech since this morning. As per patient he woke up around 7-730 in am and while trying to walk around slumped in the ground and simply couldn't get up. His famiyl saw him later during the day and notice him to have slurry speech. patient denies any weakness , dizziness, chest pain , palpitations, blurring of vision, headache, tingling or numbness of limbs. He denies passing out or being dizzy. As per daughter at bedside she gives him all his meds daily. Patient denies any abdominal pain, bowel or urinary symptoms. He lives alone with his wife and daughter living nearby and is capable of most of his ADLs.  Ready for D/C to rehab and tele showed 15 beats wide complex tachycardia.  Asymptomatic  F/U echo here in hospital showed severe AS with mean gradient  @ROS @ All other systems reviewed and negative except as noted above  Past Medical History  Diagnosis Date  . Stroke   . Hypertension   . Hyperlipidemia   . Aortic stenosis   . CHF (congestive heart failure)   . MGUS (monoclonal gammopathy of unknown significance)   . Goiter   . Rhabdomyolysis     History reviewed. No pertinent family history.  History   Social History  . Marital Status: Divorced    Spouse Name: N/A    Number of Children: N/A  . Years of Education: N/A     Occupational History  . Not on file.   Social History Main Topics  . Smoking status: Former Games developer  . Smokeless tobacco: Not on file  . Alcohol Use: No  . Drug Use: No  . Sexually Active:    Other Topics Concern  . Not on file   Social History Narrative  . No narrative on file    Past Surgical History  Procedure Date  . Circumcision         . amLODipine  10 mg Oral Daily  . aspirin  325 mg Oral Daily  . cloNIDine  0.3 mg Transdermal Weekly  . enoxaparin  40 mg Subcutaneous Daily  . furosemide  20 mg Oral BID  . irbesartan  300 mg Oral Daily  . labetalol  600 mg Oral BID  . simvastatin  20 mg Oral q1800  . sodium chloride  3 mL Intravenous Q12H  . Tamsulosin HCl  0.4 mg Oral Daily      . sodium chloride 100 mL/hr at 03/11/12 0230    Physical Exam: Affect appropriate Elderly black male HEENT: normal Neck supple with no adenopathy JVP normal no bruits no thyromegaly Lungs clear with no wheezing and good diaphragmatic motion Heart:  S1/S2preserved AS  murmur, no rub, gallop or click PMI normal Abdomen: benighn, BS positve, no tenderness, no AAA no bruit.  No HSM or HJR Distal pulses intact with no bruits No edema Neuro :  Left sided weakness  Skin warm  and dry No muscular weakness    Labs:   Lab Results  Component Value Date   WBC 5.1 03/11/2012   HGB 12.2* 03/11/2012   HCT 37.3* 03/11/2012   MCV 86.9 03/11/2012   PLT 173 03/11/2012    Lab 03/11/12 0655 03/10/12 1927  NA -- 139  K -- 4.0  CL -- 103  CO2 -- 23  BUN -- 23  CREATININE 0.93 --  CALCIUM -- 9.7  PROT -- --  BILITOT -- --  ALKPHOS -- --  ALT -- --  AST -- --  GLUCOSE -- 88   Lab Results  Component Value Date   CKTOTAL 1076* 03/11/2012   CKMB 3.1 03/11/2012   TROPONINI <0.30 03/11/2012    Lab Results  Component Value Date   CHOL 198 03/11/2012   CHOL  Value: 134        ATP III CLASSIFICATION:  <200     mg/dL   Desirable  161-096  mg/dL   Borderline High  >=045    mg/dL    High        4/0/9811   Lab Results  Component Value Date   HDL 54 03/11/2012   HDL 43 08/29/4781   Lab Results  Component Value Date   LDLCALC 128* 03/11/2012   LDLCALC  Value: 81        Total Cholesterol/HDL:CHD Risk Coronary Heart Disease Risk Table                     Men   Women  1/2 Average Risk   3.4   3.3  Average Risk       5.0   4.4  2 X Average Risk   9.6   7.1  3 X Average Risk  23.4   11.0        Use the calculated Patient Ratio above and the CHD Risk Table to determine the patient's CHD Risk.        ATP III CLASSIFICATION (LDL):  <100     mg/dL   Optimal  956-213  mg/dL   Near or Above                    Optimal  130-159  mg/dL   Borderline  086-578  mg/dL   High  >469     mg/dL   Very High 05/31/9527   Lab Results  Component Value Date   TRIG 79 03/11/2012   TRIG 52 02/28/2011   Lab Results  Component Value Date   CHOLHDL 3.7 03/11/2012   CHOLHDL 3.1 02/28/2011   No results found for this basename: LDLDIRECT      Radiology: Dg Chest 2 View  03/10/2012  *RADIOLOGY REPORT*  Clinical Data: CHF, slurred speech  CHEST - 2 VIEW  Comparison: 02/26/2011  Findings: There is rightward deviation of the trachea at the level of the thoracic inlet as previously noted.  Minimal linear scarring or subsegmental atelectasis in the lingula.  Right lung clear. Stable mild cardiomegaly.  Atheromatous aortic arch.  No effusion. Minimal spurring in the mid thoracic spine.  IMPRESSION:  1.  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 2.  No acute or superimposed abnormality.  Original Report Authenticated By: Osa Craver, M.D.   Mr Brain Wo Contrast  03/10/2012  *RADIOLOGY REPORT*  Clinical Data: Aphasia.  Weakness.  Fall.  MRI HEAD WITHOUT CONTRAST  Technique:  Multiplanar,  multiecho pulse sequences of the brain and surrounding structures were obtained according to standard protocol without intravenous contrast.  Comparison: Head CT 02/26/2011  Findings:  There is a 7 mm focus of acute infarction within the basal ganglia/posterior limb internal capsule on the right.  No other acute infarction.  The brainstem is unremarkable.  The cerebellum shows generalized atrophy.  The cerebral hemispheres show old small vessel infarctions within the basal ganglia, thalami and throughout the hemispheric deep white matter. There is an old cortical and subcortical infarction in the left occipital lobe.  No mass lesion, hemorrhage, hydrocephalus or extra-axial collection. No pituitary mass.  No inflammatory sinus disease.  No skull or skull base lesion.  IMPRESSION: 7 mm acute infarction in the right basal ganglia/posterior limb internal capsule.  Extensive chronic small vessel changes elsewhere throughout the brain.  Old left occipital cortical and subcortical infarction.  Original Report Authenticated By: Thomasenia Sales, M.D.    EKG: NSR LVH rate 72  QT 452    Echo:   -------------- Study Conclusions  ------------------------------------------------------------  ------------------------------------------------------------ Left ventricle: The cavity size was normal. Wall thickness was increased in a pattern of moderate LVH. Systolic function was normal. The estimated ejection fraction was in the range of 60% to 65%. Wall motion was normal; there were no regional wall motion abnormalities. Doppler parameters are consistent with abnormal left ventricular relaxation (grade 1 diastolic dysfunction).  ------------------------------------------------------------ Aortic valve: Trileaflet; moderately calcified leaflets. Valve mobility was restricted. Doppler: There was severe stenosis. Mild regurgitation. VTI ratio of LVOT to aortic valve: 0.23. Valve area: 0.73cm^2(VTI). Indexed valve area: 0.42cm^2/m^2 (VTI). Peak velocity ratio of LVOT to aortic valve: 0.22. Valve area: 0.68cm^2 (Vmax). Indexed valve area: 0.39cm^2/m^2 (Vmax). Mean gradient: 43mm Hg (S). Peak  gradient: 62mm Hg (S   ASSESSMENT AND PLAN:  NSVT:  Reviewed tele strip Not clear what rhythm was.  Given asymptomatic nature and previous normal myovue 2012 with normal EF I think it is safe to go to rehab Will arrange F/U with Dr Tenny Craw in the next 2-3 weeks AS:  Does not appear to be a great candidate for open AVR.  May be TAVR candidate in future but needs to recover from acute CVA HTN;  Continue current meds.   Stroke: Rehab  ASA per neuro  Signed: Charlton Haws 03/13/2012, 10:15 AM

## 2012-03-13 NOTE — Plan of Care (Signed)
Problem: Phase III Progression Outcomes Goal: Bowel program established Outcome: Completed/Met Date Met:  03/13/12 Patient was continent of bowel and bladder at time of discharge.

## 2012-03-13 NOTE — Plan of Care (Signed)
Problem: Phase III Progression Outcomes Goal: Pt without medical complications Outcome: Completed/Met Date Met:  03/13/12 Patient stable; able to be discharged to facility.

## 2012-03-13 NOTE — Plan of Care (Signed)
Problem: Phase III Progression Outcomes Goal: Hemodynamically stable Outcome: Completed/Met Date Met:  03/13/12 Vital signs stable upon discharge.

## 2012-03-13 NOTE — Plan of Care (Signed)
Problem: Phase III Progression Outcomes Goal: Rehab Team goals identified Outcome: Completed/Met Date Met:  03/13/12 Patient stable to send to SNF for further therapy.

## 2012-03-13 NOTE — Plan of Care (Signed)
Problem: Phase III Progression Outcomes Goal: Anticoagulation Therapy per MD order Outcome: Completed/Met Date Met:  03/13/12 Patient on lovenox at time of discharge.

## 2012-03-13 NOTE — Plan of Care (Signed)
Problem: Phase I Progression Outcomes Goal: Voiding-avoid urinary catheter unless indicated condem cath indicated for urgency Outcome: Completed/Met Date Met:  03/13/12 Condom cath. D/c'd prior to d/c; pt. D/c'd to SNF

## 2012-03-13 NOTE — Plan of Care (Signed)
Problem: Phase II Progression Outcomes Goal: Able to communicate Outcome: Adequate for Discharge Able to make needs known via alternate communication method; pt. Aphasic.

## 2012-03-13 NOTE — Plan of Care (Signed)
Problem: Phase II Progression Outcomes Goal: Tolerating diet Outcome: Completed/Met Date Met:  03/13/12 Able to tolerate ordered diet.

## 2012-03-13 NOTE — Progress Notes (Addendum)
This is an addendum to the discharge summary that was done by Dr. Ted Mcalpine on 03/12/2012    Subjective:   Chart reviewed. Patient denies complaints. According to his daughter Ms. Bradley [name], patient's slurred speech has significantly improved. He does have chronic facial asymmetry. Mild left-sided weakness. Denies palpitations or chest pain or dyspnea.  Objective  Vital signs in last 24 hours: Filed Vitals:   03/12/12 1802 03/12/12 2145 03/13/12 0257 03/13/12 0648  BP: 155/78 184/84 184/97 170/91  Pulse: 64 61 51 62  Temp: 99.1 F (37.3 C) 98.9 F (37.2 C) 98.3 F (36.8 C) 97.9 F (36.6 C)  TempSrc: Oral Oral Oral Oral  Resp: 18 20 20 20   Height:      Weight:      SpO2: 97% 97% 95% 97%   Weight change:   Intake/Output Summary (Last 24 hours) at 03/13/12 1002 Last data filed at 03/13/12 0100  Gross per 24 hour  Intake    480 ml  Output    600 ml  Net   -120 ml    Physical Exam:  General Exam: Comfortable. Sitting up at edge of bed. Respiratory System: Clear. No increased work of breathing.  Cardiovascular System: First and second heart sounds heard. Regular rate and rhythm. No JVD/murmurs. Telemetry shows sinus rhythm in the 60s and an episode of nonsustained 16 beats wide complex tachycardia.  Gastrointestinal System: Abdomen is non distended, soft and normal bowel sounds heard.  Central Nervous System: Alert and oriented. Dysarthria and facial asymmetry. Extremities: Symmetrical 5 x 5 power.  Labs:  Basic Metabolic Panel:  Lab 03/11/12 1610 03/10/12 1927  NA -- 139  K -- 4.0  CL -- 103  CO2 -- 23  GLUCOSE -- 88  BUN -- 23  CREATININE 0.93 0.96  CALCIUM -- 9.7  ALB -- --  PHOS -- --   Liver Function Tests: No results found for this basename: AST:3,ALT:3,ALKPHOS:3,BILITOT:3,PROT:3,ALBUMIN:3 in the last 168 hours No results found for this basename: LIPASE:3,AMYLASE:3 in the last 168 hours No results found for this basename: AMMONIA:3 in the last  168 hours CBC:  Lab 03/11/12 0655 03/10/12 1927  WBC 5.1 8.7  NEUTROABS -- 7.1  HGB 12.2* 12.9*  HCT 37.3* 38.3*  MCV 86.9 86.8  PLT 173 177   Cardiac Enzymes:  Lab 03/11/12 2038 03/11/12 1340 03/11/12 0655 03/10/12 1927  CKTOTAL 1076* 1000* 1058* 1388*  CKMB 3.1 2.8 3.1 4.1*  CKMBINDEX -- -- -- --  TROPONINI <0.30 <0.30 <0.30 <0.30   CBG: No results found for this basename: GLUCAP:5 in the last 168 hours  Iron Studies: No results found for this basename: IRON,TIBC,TRANSFERRIN,FERRITIN in the last 72 hours Studies/Results: No results found. Medications:    . sodium chloride 100 mL/hr at 03/11/12 0230      . amLODipine  10 mg Oral Daily  . aspirin  325 mg Oral Daily  . cloNIDine  0.3 mg Transdermal Weekly  . enoxaparin  40 mg Subcutaneous Daily  . furosemide  20 mg Oral BID  . irbesartan  300 mg Oral Daily  . labetalol  600 mg Oral BID  . simvastatin  20 mg Oral q1800  . sodium chloride  3 mL Intravenous Q12H  . Tamsulosin HCl  0.4 mg Oral Daily    I  have reviewed scheduled and prn medications.     Problem/Plan: Principal Problem:  *Stroke, small vessel Active Problems:  Moderate aortic stenosis  Diastolic dysfunction, left ventricle  Hypertension  H/O:  CVA (cardiovascular accident)  1. Right brain (basal ganglia/posterior limb of internal capsule) infarct, secondary to small vessel disease: Continue aspirin 325 mg daily for secondary stroke prevention and aggressive risk factor modification including control of blood pressure and hyperlipidemia. 2. An episode of nonsustained 16 beat wide complex tachycardia, question marked nonsustained ventricular tachycardia: Recently had normal stress nuclear study and 2-D echocardiogram shows moderate aortic stenosis. [facility] cardiology consult. 3. Hypertension: Continue current medications and may need further titration as an outpatient. 4. Mild rhabdomyolysis: Asymptomatic. Recommend repeating muscle enzymes and LFTs  in a couple of days from hospital discharge. 5. Moderate aortic stenosis: Outpatient followup. 6. History of monoclonal gammopathy of undetermined significance. 7. History of thyroid goiter: Outpatient followup with primary care physician. TSH is 0.991, free T4 0.77 and free T3-2.6 8. Hyperlipidemia: Continue statins. 9. Mild anemia 10. Asymptomatic bacteriuria/enterococcus.  Disposition: Pending cardiology evaluation. If no further inpatient workup planned by cardiology, will discharge to skilled nursing facility later today. Discussed at length with patient's daughter Ms. Bradley Manson Passey who was at the bedside.  Bradley [name] 03/13/2012,10:02 AM  LOS: 3 days   Addendum:  Cardiology consultation appreciated and discussed with Dr. Charlton Haws. Stable for D/C to SNF for rehab. Outpatient followup with Cards.  Bradley [name] 10:31 AM

## 2012-03-13 NOTE — Plan of Care (Signed)
Problem: Phase II Progression Outcomes Goal: Nutritional status adequate Outcome: Completed/Met Date Met:  03/13/12 Patient eating all of ordered meals.

## 2012-03-13 NOTE — Plan of Care (Signed)
Problem: Phase III Progression Outcomes Goal: Tolerates activity with minimal fatigue Outcome: Completed/Met Date Met:  03/13/12 Patient able to participate in physical therapy with minimal exertion.

## 2012-03-13 NOTE — Progress Notes (Addendum)
Pt to transfer to [facility] today via family transport. Pt, pt's family, and SNF aware of d/c. No other CSW needs. CSW signing off. Dellie Burns, MSW, [place] [phone] (weekend)

## 2012-03-13 NOTE — Plan of Care (Signed)
Problem: Phase II Progression Outcomes Goal: Discharge plan established Outcome: Completed/Met Date Met:  03/13/12 Patient to be discharged to SNF.

## 2012-03-18 ENCOUNTER — Telehealth: Payer: Self-pay | Admitting: Internal Medicine

## 2012-03-18 NOTE — Telephone Encounter (Signed)
Patient's daughter jewell called, needs to schedule post hospital appointment with Dr.Ross in 2 to 3 weeks.States prefers a late Friday pm appointment if possible.Advised Dr.Ross's schedule is full, will send to [facility] nurse for appointment.Call jewell back at [phone].

## 2012-03-18 NOTE — Telephone Encounter (Signed)
Fu call °Patient returning your call °

## 2012-03-19 NOTE — Telephone Encounter (Signed)
Called patient's daughter and advised for her father to see Dr.Ross on 4/12 at 845 am.

## 2012-04-09 ENCOUNTER — Encounter: Payer: Self-pay | Admitting: Internal Medicine

## 2012-04-09 ENCOUNTER — Ambulatory Visit (INDEPENDENT_AMBULATORY_CARE_PROVIDER_SITE_OTHER): Payer: Medicare Other | Admitting: Internal Medicine

## 2012-04-09 DIAGNOSIS — I350 Nonrheumatic aortic (valve) stenosis: Secondary | ICD-10-CM

## 2012-04-09 DIAGNOSIS — I359 Nonrheumatic aortic valve disorder, unspecified: Secondary | ICD-10-CM

## 2012-04-09 DIAGNOSIS — Z8673 Personal history of transient ischemic attack (TIA), and cerebral infarction without residual deficits: Secondary | ICD-10-CM

## 2012-04-09 DIAGNOSIS — I119 Hypertensive heart disease without heart failure: Secondary | ICD-10-CM

## 2012-04-09 DIAGNOSIS — I10 Essential (primary) hypertension: Secondary | ICD-10-CM

## 2012-04-09 LAB — CK: Total CK: 242 U/L — ABNORMAL HIGH (ref 7–232)

## 2012-04-09 NOTE — Patient Instructions (Signed)
Echo and office visit in August on the same day.

## 2012-04-09 NOTE — Progress Notes (Signed)
HPI Patient is a 71 year old with a history of HTN, moderate AS, CVA )(most recent 02/2012).  Also has a history of rhabdomyolysis  Note CK in hosp was 1000 Echo in the hospital showed LVEF was 60 to 65%  Mean gradient through the valve was 43 mm Hg consistent with severe AS>  Alson in the nospital had asymptomatic NSVT.  Note myoview in 2012 was normal. He is now at [facility]  Denies CP  No dizziness or syncope  Breathing is OK    No Known Allergies  Current Outpatient Prescriptions  Medication Sig Dispense Refill  . amLODipine (NORVASC) 10 MG tablet Take 10 mg by mouth daily.      Marland Kitchen aspirin 325 MG tablet Take 1 tablet (325 mg total) by mouth daily.  30 tablet  0  . cloNIDine (CATAPRES - DOSED IN MG/24 HR) 0.3 mg/24hr Place 1 patch onto the skin once a week.      . furosemide (LASIX) 40 MG tablet Take 40 mg by mouth 2 (two) times daily.       . irbesartan (AVAPRO) 300 MG tablet Take 300 mg by mouth daily.      Marland Kitchen labetalol (NORMODYNE) 300 MG tablet Take 600 mg by mouth 2 (two) times daily.      . pravastatin (PRAVACHOL) 10 MG tablet Take 10 mg by mouth daily.      Marland Kitchen senna (SENOKOT) 8.6 MG TABS Take 1 tablet by mouth. If needed for constipation      . Tamsulosin HCl (FLOMAX) 0.4 MG CAPS Take 0.4 mg by mouth daily.        Past Medical History  Diagnosis Date  . Stroke   . Hypertension   . Hyperlipidemia   . Aortic stenosis   . CHF (congestive heart failure)   . MGUS (monoclonal gammopathy of unknown significance)   . Goiter   . Rhabdomyolysis     Past Surgical History  Procedure Date  . Circumcision     No family history on file.  History   Social History  . Marital Status: Divorced    Spouse Name: N/A    Number of Children: N/A  . Years of Education: N/A   Occupational History  . Not on file.   Social History Main Topics  . Smoking status: Former Games developer  . Smokeless tobacco: Not on file  . Alcohol Use: No  . Drug Use: No  . Sexually Active:    Other Topics  Concern  . Not on file   Social History Narrative  . No narrative on file    Review of Systems:  All systems reviewed.  They are negative to the above problem except as previously stated.  Vital Signs: BP 120/69  Pulse 63  Ht 5\' 5"  (1.651 m)  Wt 150 lb (68.04 kg)  BMI 24.96 kg/m2  Physical Exam Patient in NAD  Examined in wheelchair. HEENT:  Normocephalic, atraumatic. EOMI, PERRLA.  Neck: JVP is normal. No thyromegaly. No bruits.  Lungs: clear to auscultation. No rales no wheezes.  Heart: Regular rate and rhythm. Normal S1, S2. No S3.  Gr III:VI later peaking systolic murmur at base. PMI not displaced.  Abdomen:  Supple, nontender. Normal bowel sounds. No masses. No hepatomegaly.  Extremities:   Good distal pulses throughout. No lower extremity edema.  Musculoskeletal :moving all extremities.  Neuro:   alert and oriented x3.  CN II-XII grossly intact.  EKG:  SR.  64  First degree AV block.  T wave inversion I, II, AVL, V4-V6.  Septal MI.   Assessment and Plan:

## 2012-04-11 NOTE — Assessment & Plan Note (Signed)
Continuing with rehab.

## 2012-04-11 NOTE — Assessment & Plan Note (Signed)
Good control of BP.  No change in regimen.

## 2012-04-11 NOTE — Assessment & Plan Note (Signed)
Echo with severe AS.  Exam goes along with this.  Patient is rel asymptomatic, though not that active.   I would recomm repeat echo at end of summer.  I am not sure he is a candidate for intervention.

## 2012-04-30 ENCOUNTER — Other Ambulatory Visit: Payer: Self-pay | Admitting: *Deleted

## 2012-08-20 ENCOUNTER — Ambulatory Visit (INDEPENDENT_AMBULATORY_CARE_PROVIDER_SITE_OTHER): Payer: Medicaid Other | Admitting: Internal Medicine

## 2012-08-20 ENCOUNTER — Encounter: Payer: Self-pay | Admitting: Internal Medicine

## 2012-08-20 ENCOUNTER — Ambulatory Visit (HOSPITAL_COMMUNITY): Payer: Medicare Other | Attending: Cardiology | Admitting: Radiology

## 2012-08-20 VITALS — BP 138/84 | HR 60 | Ht 65.0 in | Wt 149.0 lb

## 2012-08-20 DIAGNOSIS — I350 Nonrheumatic aortic (valve) stenosis: Secondary | ICD-10-CM

## 2012-08-20 DIAGNOSIS — I079 Rheumatic tricuspid valve disease, unspecified: Secondary | ICD-10-CM | POA: Insufficient documentation

## 2012-08-20 DIAGNOSIS — E785 Hyperlipidemia, unspecified: Secondary | ICD-10-CM

## 2012-08-20 DIAGNOSIS — I10 Essential (primary) hypertension: Secondary | ICD-10-CM

## 2012-08-20 DIAGNOSIS — I359 Nonrheumatic aortic valve disorder, unspecified: Secondary | ICD-10-CM | POA: Insufficient documentation

## 2012-08-20 DIAGNOSIS — Z87891 Personal history of nicotine dependence: Secondary | ICD-10-CM | POA: Insufficient documentation

## 2012-08-20 DIAGNOSIS — I379 Nonrheumatic pulmonary valve disorder, unspecified: Secondary | ICD-10-CM | POA: Insufficient documentation

## 2012-08-20 NOTE — Progress Notes (Signed)
Echocardiogram performed.  

## 2012-08-20 NOTE — Progress Notes (Signed)
HPI Patient is a 71 year old with a history of HTN, moderate AS, CVA )(most recent 02/2012). Also has a history of rhabdomyolysis Note CK in hosp was 1000 Last  showed LVEF was 60 to 65% Mean gradient through the valve was 43 mm Hg consistent with severe AS> Also in the nospital had asymptomatic NSVT. Note myoview in 2012 was normal.  He lives at [facility] Since seen he has done well.  He denies SOB (though not that actve)  No PND  No orthopnea.  No palpitations.  No SOB  NoCP.    No Known Allergies  Current Outpatient Prescriptions  Medication Sig Dispense Refill  . amLODipine (NORVASC) 10 MG tablet Take 10 mg by mouth daily.      Marland Kitchen aspirin 325 MG tablet Take 1 tablet (325 mg total) by mouth daily.  30 tablet  0  . cloNIDine (CATAPRES - DOSED IN MG/24 HR) 0.3 mg/24hr Place 1 patch onto the skin once a week.      . furosemide (LASIX) 40 MG tablet Take 40 mg by mouth as directed. Taking 1/2 bid      . irbesartan (AVAPRO) 300 MG tablet Take 300 mg by mouth daily.      Marland Kitchen labetalol (NORMODYNE) 300 MG tablet Take 600 mg by mouth 2 (two) times daily.      . pravastatin (PRAVACHOL) 20 MG tablet Take 1 tablet (20 mg total) by mouth every evening.  30 tablet  11  . senna (SENOKOT) 8.6 MG TABS Take 1 tablet by mouth. If needed for constipation      . Tamsulosin HCl (FLOMAX) 0.4 MG CAPS Take 0.4 mg by mouth daily.        Past Medical History  Diagnosis Date  . Stroke   . Hypertension   . Hyperlipidemia   . Aortic stenosis   . CHF (congestive heart failure)   . MGUS (monoclonal gammopathy of unknown significance)   . Goiter   . Rhabdomyolysis     Past Surgical History  Procedure Date  . Circumcision     No family history on file.  History   Social History  . Marital Status: Divorced    Spouse Name: N/A    Number of Children: N/A  . Years of Education: N/A   Occupational History  . Not on file.   Social History Main Topics  . Smoking status: Former Games developer  . Smokeless  tobacco: Not on file  . Alcohol Use: No  . Drug Use: No  . Sexually Active:    Other Topics Concern  . Not on file   Social History Narrative  . No narrative on file    Review of Systems:  All systems reviewed.  They are negative to the above problem except as previously stated.  Vital Signs: BP 138/84  Pulse 60  Ht 5\' 5"  (1.651 m)  Wt 149 lb (67.586 kg)  BMI 24.79 kg/m2  Physical Exam Patient is in NAD HEENT:  Normocephalic, atraumatic. EOMI, PERRLA.  Neck: JVP is normal.  Lungs: clear to auscultation. No rales no wheezes.  Heart: Regular rate and rhythm. Normal S1, S2. No S3.  Gr III/VI systolic murmur at basePMI not displaced.  Abdomen:  Supple, nontender. Normal bowel sounds. No masses. No hepatomegaly.  Extremities:   Good distal pulses throughout. tr lower extremity edema.  Musculoskeletal :moving all extremities.  Neuro:   alert and oriented x3.  CN II-XII grossly intact.   Assessment and Plan:  1.  Aortic stenosis.  Echo today shows LVEF is normal.  AV has restricted motion with a mean gradient of 40 mm Hg  Overall unchanged from previous echo I spent several min probing for symptoms but he has none.  I I would continue to follow No change for now.   Call if any of symptoms develop otherwise plan f/u for spring.  2.  HTN  Adequate control.  EKG on last visit with first degree AV block.  Patient denies dizziness.  No SOB.   Reluctant to change meds.  I would follow.  3.  HL  Continue pravastatin.  Wil need to be followed.

## 2012-08-20 NOTE — Patient Instructions (Signed)
Your physician wants you to follow-up in: April/May 2013You will receive a reminder letter in the mail two months in advance. If you don't receive a letter, please call our office to schedule the follow-up appointment.

## 2013-12-04 ENCOUNTER — Encounter (HOSPITAL_COMMUNITY): Payer: Self-pay | Admitting: Emergency Medicine

## 2013-12-04 ENCOUNTER — Emergency Department (INDEPENDENT_AMBULATORY_CARE_PROVIDER_SITE_OTHER)
Admission: EM | Admit: 2013-12-04 | Discharge: 2013-12-04 | Disposition: A | Discharge status: Home / Self Care | Payer: Medicare Other | Source: Home / Self Care | Attending: Family Medicine | Admitting: Family Medicine

## 2013-12-04 DIAGNOSIS — L720 Epidermal cyst: Secondary | ICD-10-CM

## 2013-12-04 DIAGNOSIS — I10 Essential (primary) hypertension: Secondary | ICD-10-CM

## 2013-12-04 DIAGNOSIS — L723 Sebaceous cyst: Secondary | ICD-10-CM

## 2013-12-04 MED ORDER — SULFAMETHOXAZOLE-TRIMETHOPRIM 800-160 MG PO TABS: 1.0000 | ORAL_TABLET | Freq: Two times a day (BID) | ORAL | Status: DC

## 2013-12-04 NOTE — ED Provider Notes (Signed)
CSN: [id number]     Arrival date & time 12/04/13  1127 History   First MD Initiated Contact with Patient 12/04/13 1214     Chief Complaint  Patient presents with  . Abscess   (Consider location/radiation/quality/duration/timing/severity/associated sxs/prior Treatment) HPI Comments: Daughter mostly communicates for pt. Pt does respond to yes/no questions. Pt c/o pain in abscess area 2 days ago. This morning it started draining when he put a hat on.   Pt did not take bp meds this morning.   Patient is a 72 y.o. male presenting with abscess. The history is provided by the patient and a relative.  Abscess Location:  Head/neck Head/neck abscess location:  Scalp Size:  1cm Abscess quality: draining   Red streaking: no   Duration:  2 days Progression:  Unchanged Chronicity:  New Relieved by:  None tried Worsened by:  Nothing tried Ineffective treatments:  None tried Associated symptoms: no fever   Risk factors: no prior abscess     Past Medical History  Diagnosis Date  . Stroke   . Hypertension   . Hyperlipidemia   . Aortic stenosis   . CHF (congestive heart failure)   . MGUS (monoclonal gammopathy of unknown significance)   . Goiter   . Rhabdomyolysis    Past Surgical History  Procedure Laterality Date  . Circumcision     History reviewed. No pertinent family history. History  Substance Use Topics  . Smoking status: Former Games developer  . Smokeless tobacco: Not on file  . Alcohol Use: No    Review of Systems  Constitutional: Negative for fever and chills.  Skin:       abscess    Allergies  Review of patient's allergies indicates no known allergies.  Home Medications   Current Outpatient Rx  Name  Route  Sig  Dispense  Refill  . amLODipine (NORVASC) 10 MG tablet   Oral   Take 10 mg by mouth daily.         Marland Kitchen aspirin 81 MG tablet   Oral   Take 81 mg by mouth daily.         . cloNIDine (CATAPRES - DOSED IN MG/24 HR) 0.3 mg/24hr   Transdermal   Place 1  patch onto the skin once a week.         . furosemide (LASIX) 40 MG tablet   Oral   Take 40 mg by mouth as directed. Taking 1/2 bid         . hydrALAZINE (APRESOLINE) 50 MG tablet   Oral   Take 50 mg by mouth 3 (three) times daily.         . irbesartan (AVAPRO) 300 MG tablet   Oral   Take 300 mg by mouth daily.         Marland Kitchen labetalol (NORMODYNE) 300 MG tablet   Oral   Take 600 mg by mouth 2 (two) times daily.         . pravastatin (PRAVACHOL) 20 MG tablet   Oral   Take 1 tablet (20 mg total) by mouth every evening.   30 tablet   11   . Tamsulosin HCl (FLOMAX) 0.4 MG CAPS   Oral   Take 0.4 mg by mouth daily.         Marland Kitchen senna (SENOKOT) 8.6 MG TABS   Oral   Take 1 tablet by mouth. If needed for constipation         . sulfamethoxazole-trimethoprim (SEPTRA DS) 800-160 MG per tablet  Oral   Take 1 tablet by mouth every 12 (twelve) hours.   14 tablet   0    BP 204/95  Pulse 85  Temp(Src) 98.3 F (36.8 C) (Oral)  Resp 17  SpO2 99% Physical Exam  Constitutional: He appears well-developed and well-nourished. No distress.  Skin: Skin is warm and dry.  1cm protruding lesion R scalp in temple area, open area in center with scab covering part of it draining yellow pus. Scab removed with 18 gauge needle, large amount thick, cheesy material assoc with yellow pus removed. Wound left open    ED Course  Procedures (including critical care time) Labs Review Labs Reviewed  CULTURE, ROUTINE-ABSCESS   Imaging Review No results found.  EKG Interpretation    Date/Time:    Ventricular Rate:    PR Interval:    QRS Duration:   QT Interval:    QTC Calculation:   R Axis:     Text Interpretation:              MDM   1. Epidermal cyst   2. Hypertension   abscess culture sent. Rx bactrim ds 1 po BID #14. Pt to use antibiotic ointment on wound. Take bp meds when gets home.      Cathlyn Parsons, NP 12/04/13 1220

## 2013-12-04 NOTE — ED Notes (Addendum)
Pt c/o abscess on right side along his hair line. Daughter reports she just noticed it this morning. Is currently draining yellow pus. Pt denies feeling sick or fever. Pts daughter is present with him and answering questions for him due to pts hx of stroke.

## 2013-12-04 NOTE — ED Provider Notes (Signed)
Medical screening examination/treatment/procedure(s) were performed by resident physician or non-physician practitioner and as supervising physician I was immediately available for consultation/collaboration.   Aaron Bostwick DOUGLAS MD.   Dequavious Harshberger D Kiante Petrovich, MD 12/04/13 1408 

## 2013-12-07 LAB — CULTURE, ROUTINE-ABSCESS

## 2014-05-05 ENCOUNTER — Ambulatory Visit (INDEPENDENT_AMBULATORY_CARE_PROVIDER_SITE_OTHER): Payer: Medicare Other | Admitting: Internal Medicine

## 2014-05-05 ENCOUNTER — Encounter: Payer: Self-pay | Admitting: Internal Medicine

## 2014-05-05 VITALS — BP 160/82 | HR 71 | Ht 65.0 in | Wt 154.0 lb

## 2014-05-05 DIAGNOSIS — I359 Nonrheumatic aortic valve disorder, unspecified: Secondary | ICD-10-CM

## 2014-05-05 DIAGNOSIS — I350 Nonrheumatic aortic (valve) stenosis: Secondary | ICD-10-CM

## 2014-05-05 NOTE — Patient Instructions (Signed)
Your physician recommends that you continue on your current medications as directed. Please refer to the Current Medication list given to you today.     

## 2014-05-05 NOTE — Progress Notes (Signed)
HPI Patient is a 73 year old with a history of HTN, moderate AS, CVA )(most recent 02/2012). Also has a history of rhabdomyolysis Note CK in hosp was 1000 Last  showed LVEF was 60 to 65% Mean gradient through the valve was 43 mm Hg consistent with severe AS> Also in the nospital had asymptomatic NSVT. Note myoview in 2012 was normal.   I saw him last in Aug 2013.  He returns today because Dr Rex Kras wanted him seen  He and daughter deny CP  No SOB  No dizziness  No palpitaitons.   No Known Allergies  Current Outpatient Prescriptions  Medication Sig Dispense Refill  . amLODipine (NORVASC) 10 MG tablet Take 10 mg by mouth daily.      Marland Kitchen aspirin 81 MG tablet Take 81 mg by mouth daily.      . cloNIDine (CATAPRES - DOSED IN MG/24 HR) 0.3 mg/24hr Place 1 patch onto the skin once a week.      . furosemide (LASIX) 40 MG tablet Take 40 mg by mouth as directed. Taking 1/2 bid      . hydrALAZINE (APRESOLINE) 50 MG tablet Take 50 mg by mouth 3 (three) times daily.      . irbesartan (AVAPRO) 300 MG tablet Take 300 mg by mouth daily.      Marland Kitchen labetalol (NORMODYNE) 300 MG tablet Take 600 mg by mouth 2 (two) times daily.      . pravastatin (PRAVACHOL) 20 MG tablet Take 1 tablet (20 mg total) by mouth every evening.  30 tablet  11  . Tamsulosin HCl (FLOMAX) 0.4 MG CAPS Take 0.4 mg by mouth daily.       No current facility-administered medications for this visit.    Past Medical History  Diagnosis Date  . Stroke   . Hypertension   . Hyperlipidemia   . Aortic stenosis   . CHF (congestive heart failure)   . MGUS (monoclonal gammopathy of unknown significance)   . Goiter   . Rhabdomyolysis     Past Surgical History  Procedure Laterality Date  . Circumcision      No family history on file.  History   Social History  . Marital Status: Divorced    Spouse Name: N/A    Number of Children: N/A  . Years of Education: N/A   Occupational History  . Not on file.   Social History Main Topics  .  Smoking status: Former Research scientist (life sciences)  . Smokeless tobacco: Not on file  . Alcohol Use: No  . Drug Use: No  . Sexual Activity:    Other Topics Concern  . Not on file   Social History Narrative  . No narrative on file    Review of Systems:  All systems reviewed.  They are negative to the above problem except as previously stated.  Vital Signs: BP 160/82  Pulse 71  Ht 5\' 5"  (1.651 m)  Wt 154 lb (69.854 kg)  BMI 25.63 kg/m2  Physical Exam Patient is in NAD HEENT:  Normocephalic, atraumatic. EOMI, PERRLA.  Neck: JVP is normal. Large mass on left neck  Nontender.   Lungs: clear to auscultation. No rales no wheezes.  Heart: Regular rate and rhythm. Normal S1, S2. No S3.  Gr III/VI systolic murmur at basePMI not displaced.  Abdomen:  Supple, nontender. Normal bowel sounds. No masses. No hepatomegaly.  Extremities:   Good distal pulses throughout. tr lower extremity edema.  Musculoskeletal :moving all extremities.  Neuro:   alert and  oriented x3.  CN II-XII grossly intact.  EKG  SR 71  First degree AV block  PR interval 366 msec  Anteroseptal MI  T wave inversion V5, V6, I Assessment and Plan:  1.  Aortic stenosis.    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  He denies symptoms though he is not active.  Daughter appears very angry, answers very curtly.  May be affecting some of answer I explained to patinet and daughter that if and when he has symptoms that is time for something to be done about valve  NOw, that may even be done with a balloon based procedure, not open surgery The admit they are aware and will follow WOuld like t obe seen again when seen by Dr Rex Kras (he is one they say who wants pt seen)  2.  Neck mass.  Patient's daughter says that this has been evaluated at hospital  I do not however see records.  Denies fevers, chills, difficulty swallowing  3  HTN  I would not push any more given AS  4.  AV block  Follow I  3.  HL  Continue pravastatin.  Wil need to be  followed.

## 2014-05-08 ENCOUNTER — Emergency Department (HOSPITAL_COMMUNITY): Payer: Medicare Other

## 2014-05-08 ENCOUNTER — Encounter (HOSPITAL_COMMUNITY): Payer: Self-pay | Admitting: Emergency Medicine

## 2014-05-08 ENCOUNTER — Inpatient Hospital Stay (HOSPITAL_COMMUNITY)
Admission: EM | Admit: 2014-05-08 | Discharge: 2014-05-10 | DRG: 690 | Disposition: A | Discharge status: Home Health | Payer: Medicare Other | Attending: Internal Medicine | Admitting: Internal Medicine

## 2014-05-08 DIAGNOSIS — I359 Nonrheumatic aortic valve disorder, unspecified: Secondary | ICD-10-CM | POA: Diagnosis present

## 2014-05-08 DIAGNOSIS — N390 Urinary tract infection, site not specified: Principal | ICD-10-CM | POA: Diagnosis present

## 2014-05-08 DIAGNOSIS — Z87891 Personal history of nicotine dependence: Secondary | ICD-10-CM

## 2014-05-08 DIAGNOSIS — N189 Chronic kidney disease, unspecified: Secondary | ICD-10-CM | POA: Diagnosis present

## 2014-05-08 DIAGNOSIS — I639 Cerebral infarction, unspecified: Secondary | ICD-10-CM

## 2014-05-08 DIAGNOSIS — N183 Chronic kidney disease, stage 3 unspecified: Secondary | ICD-10-CM | POA: Diagnosis present

## 2014-05-08 DIAGNOSIS — I69922 Dysarthria following unspecified cerebrovascular disease: Secondary | ICD-10-CM

## 2014-05-08 DIAGNOSIS — D696 Thrombocytopenia, unspecified: Secondary | ICD-10-CM | POA: Diagnosis present

## 2014-05-08 DIAGNOSIS — D472 Monoclonal gammopathy: Secondary | ICD-10-CM | POA: Diagnosis present

## 2014-05-08 DIAGNOSIS — I129 Hypertensive chronic kidney disease with stage 1 through stage 4 chronic kidney disease, or unspecified chronic kidney disease: Secondary | ICD-10-CM | POA: Diagnosis present

## 2014-05-08 DIAGNOSIS — N289 Disorder of kidney and ureter, unspecified: Secondary | ICD-10-CM

## 2014-05-08 DIAGNOSIS — I519 Heart disease, unspecified: Secondary | ICD-10-CM

## 2014-05-08 DIAGNOSIS — I509 Heart failure, unspecified: Secondary | ICD-10-CM | POA: Diagnosis present

## 2014-05-08 DIAGNOSIS — D649 Anemia, unspecified: Secondary | ICD-10-CM | POA: Diagnosis present

## 2014-05-08 DIAGNOSIS — I472 Ventricular tachycardia, unspecified: Secondary | ICD-10-CM

## 2014-05-08 DIAGNOSIS — I10 Essential (primary) hypertension: Secondary | ICD-10-CM

## 2014-05-08 DIAGNOSIS — Z7982 Long term (current) use of aspirin: Secondary | ICD-10-CM

## 2014-05-08 DIAGNOSIS — E049 Nontoxic goiter, unspecified: Secondary | ICD-10-CM | POA: Diagnosis present

## 2014-05-08 DIAGNOSIS — I69998 Other sequelae following unspecified cerebrovascular disease: Secondary | ICD-10-CM

## 2014-05-08 DIAGNOSIS — E860 Dehydration: Secondary | ICD-10-CM

## 2014-05-08 DIAGNOSIS — I350 Nonrheumatic aortic (valve) stenosis: Secondary | ICD-10-CM

## 2014-05-08 DIAGNOSIS — Z79899 Other long term (current) drug therapy: Secondary | ICD-10-CM

## 2014-05-08 DIAGNOSIS — E785 Hyperlipidemia, unspecified: Secondary | ICD-10-CM | POA: Diagnosis present

## 2014-05-08 DIAGNOSIS — Z9181 History of falling: Secondary | ICD-10-CM

## 2014-05-08 DIAGNOSIS — N179 Acute kidney failure, unspecified: Secondary | ICD-10-CM | POA: Diagnosis present

## 2014-05-08 HISTORY — DX: Urinary tract infection, site not specified: N39.0

## 2014-05-08 LAB — URINALYSIS, ROUTINE W REFLEX MICROSCOPIC
Bilirubin Urine: NEGATIVE
GLUCOSE, UA: NEGATIVE mg/dL
KETONES UR: NEGATIVE mg/dL
Nitrite: NEGATIVE
PROTEIN: 30 mg/dL — AB
Specific Gravity, Urine: 1.02 (ref 1.005–1.030)
UROBILINOGEN UA: 1 mg/dL (ref 0.0–1.0)
pH: 5 (ref 5.0–8.0)

## 2014-05-08 LAB — CBC WITH DIFFERENTIAL/PLATELET
BASOS PCT: 0 % (ref 0–1)
Basophils Absolute: 0 10*3/uL (ref 0.0–0.1)
EOS ABS: 0 10*3/uL (ref 0.0–0.7)
EOS PCT: 0 % (ref 0–5)
HCT: 33.1 % — ABNORMAL LOW (ref 39.0–52.0)
Hemoglobin: 10.7 g/dL — ABNORMAL LOW (ref 13.0–17.0)
Lymphocytes Relative: 3 % — ABNORMAL LOW (ref 12–46)
Lymphs Abs: 0.5 10*3/uL — ABNORMAL LOW (ref 0.7–4.0)
MCH: 28.3 pg (ref 26.0–34.0)
MCHC: 32.3 g/dL (ref 30.0–36.0)
MCV: 87.6 fL (ref 78.0–100.0)
Monocytes Absolute: 0.7 10*3/uL (ref 0.1–1.0)
Monocytes Relative: 4 % (ref 3–12)
Neutro Abs: 18 10*3/uL — ABNORMAL HIGH (ref 1.7–7.7)
Neutrophils Relative %: 93 % — ABNORMAL HIGH (ref 43–77)
PLATELETS: 127 10*3/uL — AB (ref 150–400)
RBC: 3.78 MIL/uL — ABNORMAL LOW (ref 4.22–5.81)
RDW: 14.3 % (ref 11.5–15.5)
WBC: 19.3 10*3/uL — ABNORMAL HIGH (ref 4.0–10.5)

## 2014-05-08 LAB — URINE MICROSCOPIC-ADD ON

## 2014-05-08 LAB — BASIC METABOLIC PANEL
BUN: 33 mg/dL — ABNORMAL HIGH (ref 6–23)
CALCIUM: 9.2 mg/dL (ref 8.4–10.5)
CO2: 21 mEq/L (ref 19–32)
Chloride: 108 mEq/L (ref 96–112)
Creatinine, Ser: 1.63 mg/dL — ABNORMAL HIGH (ref 0.50–1.35)
GFR calc Af Amer: 47 mL/min — ABNORMAL LOW (ref >90)
GFR, EST NON AFRICAN AMERICAN: 40 mL/min — AB (ref >90)
Glucose, Bld: 95 mg/dL (ref 70–99)
Potassium: 3.9 mEq/L (ref 3.7–5.3)
SODIUM: 145 meq/L (ref 137–147)

## 2014-05-08 LAB — CBG MONITORING, ED: Glucose-Capillary: 97 mg/dL (ref 70–99)

## 2014-05-08 MED ORDER — ENOXAPARIN SODIUM 30 MG/0.3ML ~~LOC~~ SOLN
30.0000 mg | SUBCUTANEOUS | Status: DC
  Administered 2014-05-08: 30 mg via SUBCUTANEOUS
  Filled 2014-05-08 (×2): qty 0.3

## 2014-05-08 MED ORDER — TAMSULOSIN HCL 0.4 MG PO CAPS
0.4000 mg | ORAL_CAPSULE | Freq: Every day | ORAL | Status: DC
  Administered 2014-05-08 – 2014-05-10 (×3): 0.4 mg via ORAL
  Filled 2014-05-08 (×3): qty 1

## 2014-05-08 MED ORDER — ASPIRIN 81 MG PO CHEW
81.0000 mg | CHEWABLE_TABLET | Freq: Every day | ORAL | Status: DC
  Administered 2014-05-08 – 2014-05-10 (×3): 81 mg via ORAL
  Filled 2014-05-08 (×3): qty 1

## 2014-05-08 MED ORDER — ACETAMINOPHEN 650 MG RE SUPP: 650.0000 mg | Freq: Four times a day (QID) | RECTAL | Status: DC | PRN

## 2014-05-08 MED ORDER — DEXTROSE 5 % IV SOLN
1.0000 g | INTRAVENOUS | Status: DC
Start: 2014-05-09 — End: 2014-05-10
  Administered 2014-05-09: 1 g via INTRAVENOUS
  Filled 2014-05-08 (×2): qty 10

## 2014-05-08 MED ORDER — ACETAMINOPHEN 325 MG PO TABS: 650.0000 mg | ORAL_TABLET | Freq: Four times a day (QID) | ORAL | Status: DC | PRN

## 2014-05-08 MED ORDER — DEXTROSE 5 % IV SOLN
1.0000 g | Freq: Once | INTRAVENOUS | Status: AC
  Administered 2014-05-08: 1 g via INTRAVENOUS
  Filled 2014-05-08: qty 10

## 2014-05-08 MED ORDER — ONDANSETRON HCL 4 MG/2ML IJ SOLN: 4.0000 mg | Freq: Four times a day (QID) | INTRAMUSCULAR | Status: DC | PRN

## 2014-05-08 MED ORDER — SODIUM CHLORIDE 0.9 % IV SOLN
INTRAVENOUS | Status: DC
  Administered 2014-05-09: 03:00:00 via INTRAVENOUS

## 2014-05-08 MED ORDER — SIMVASTATIN 10 MG PO TABS
10.0000 mg | ORAL_TABLET | Freq: Every day | ORAL | Status: DC
Start: 2014-05-09 — End: 2014-05-10
  Administered 2014-05-09: 10 mg via ORAL
  Filled 2014-05-08 (×2): qty 1

## 2014-05-08 MED ORDER — SODIUM CHLORIDE 0.9 % IV BOLUS (SEPSIS)
500.0000 mL | Freq: Once | INTRAVENOUS | Status: AC
  Administered 2014-05-08: 500 mL via INTRAVENOUS

## 2014-05-08 MED ORDER — LABETALOL HCL 300 MG PO TABS
300.0000 mg | ORAL_TABLET | Freq: Two times a day (BID) | ORAL | Status: DC
Start: 2014-05-08 — End: 2014-05-10
  Administered 2014-05-08 – 2014-05-10 (×4): 300 mg via ORAL
  Filled 2014-05-08 (×5): qty 1

## 2014-05-08 MED ORDER — ASPIRIN 81 MG PO TABS: 81.0000 mg | ORAL_TABLET | Freq: Every day | ORAL | Status: DC

## 2014-05-08 MED ORDER — CLONIDINE HCL 0.3 MG/24HR TD PTWK: 0.3000 mg | MEDICATED_PATCH | TRANSDERMAL | Status: DC

## 2014-05-08 MED ORDER — SODIUM CHLORIDE 0.9 % IV SOLN
Freq: Once | INTRAVENOUS | Status: AC
  Administered 2014-05-08: 1000 mL via INTRAVENOUS

## 2014-05-08 MED ORDER — ONDANSETRON HCL 4 MG PO TABS: 4.0000 mg | ORAL_TABLET | Freq: Four times a day (QID) | ORAL | Status: DC | PRN

## 2014-05-08 NOTE — ED Notes (Signed)
MD at bedside.ADM MD

## 2014-05-08 NOTE — Progress Notes (Signed)
Report taken from [place], [place] in ED.

## 2014-05-08 NOTE — ED Notes (Signed)
Family reports that pt woke up with slurred speech and unsteady gait. Pt LSN yesterday. Pt fell on Friday. Pt also c/o pain to testicle area. No facial droop, Grips equal, no arm drift.

## 2014-05-08 NOTE — H&P (Signed)
[facility] History and Physical  Bradley [name] [name] DOB: 07-19-1941 DOA: 05/08/2014  Referring physician: [name] PCP: Bradley Pac, MD   Chief Complaint: slurred speech  HPI: Bradley [name] is a 73 y.o. male  Brought to ED by family who noticed speech seemed slurred. Has right sided weakness from previous stroke.  Lives alone and walks with walker.  Family thinks he may be eating less than usual. No other symptoms. Patient feels fine and has no complaints.  CT brain in ED shows nothing acute. Workup significant for UTI, creatinine of 1.6, WBC 19K. No labs since 2013, creatinine at that time <1. Pt denies dysuria, pain, fever, chills, nausea or vomiting.  Review of Systems:  Systems reviewed. As above. Otherwise negative.  Past Medical History  Diagnosis Date  . Stroke   . Hypertension   . Hyperlipidemia   . Aortic stenosis   . CHF (congestive heart failure)   . MGUS (monoclonal gammopathy of unknown significance)   . Goiter   . Rhabdomyolysis    Past Surgical History  Procedure Laterality Date  . Circumcision     Social History:  reports that he has quit smoking. He does not have any smokeless tobacco history on file. He reports that he does not drink alcohol or use illicit drugs.  No Known Allergies  FH: per daughter "nothing"   Prior to Admission medications   Medication Sig Start Date End Date Taking? Authorizing Provider  amLODipine (NORVASC) 10 MG tablet Take 10 mg by mouth daily.   Yes Historical Provider, MD  aspirin 81 MG tablet Take 81 mg by mouth daily.   Yes Historical Provider, MD  cloNIDine (CATAPRES - DOSED IN MG/24 HR) 0.3 mg/24hr Place 1 patch onto the skin once a week. Sundays   Yes Historical Provider, MD  furosemide (LASIX) 40 MG tablet Take 20 mg by mouth 2 (two) times daily.    Yes Historical Provider, MD  hydrALAZINE (APRESOLINE) 50 MG tablet Take 50-100 mg by mouth 2 (two) times daily. 100 mg in the morning and 50 mg in the evening    Yes Historical Provider, MD  irbesartan (AVAPRO) 300 MG tablet Take 300 mg by mouth daily.   Yes Historical Provider, MD  labetalol (NORMODYNE) 300 MG tablet Take 600 mg by mouth 2 (two) times daily.   Yes Historical Provider, MD  pravastatin (PRAVACHOL) 20 MG tablet Take 1 tablet (20 mg total) by mouth every evening. 04/30/12  Yes Fay Records, MD  Tamsulosin HCl (FLOMAX) 0.4 MG CAPS Take 0.4 mg by mouth daily.   Yes Historical Provider, MD   Physical Exam: Filed Vitals:   05/08/14 1731  BP: 127/61  Pulse: 59  Temp:   Resp:     BP 127/61  Pulse 59  Temp(Src) 99.1 F (37.3 C) (Oral)  Resp 18  Ht 5\' 5"  (1.651 m)  Wt 69.854 kg (154 lb)  BMI 25.63 kg/m2  SpO2 98% BP 162/68  Pulse 61  Temp(Src) 99.1 F (37.3 C) (Oral)  Resp 18  Ht 5\' 5"  (1.651 m)  Wt 69.854 kg (154 lb)  BMI 25.63 kg/m2  SpO2 98%  General Appearance:    Alert, cooperative, no distress, appears stated age  Head:    Normocephalic, without obvious abnormality, atraumatic  Eyes:    PERRL, conjunctiva/corneas clear, EOM's intact, fundi    benign, both eyes          Nose:   Nares normal, septum midline, mucosa normal, no drainage  or sinus tenderness  Throat:   Slightly dry MM. OP without erythema or exudate  Neck:   Supple, symmetrical, trachea midline, no adenopathy;       thyroid:  No enlargement/tenderness/nodules; no carotid   bruit or JVD  Back:     Symmetric, no curvature, ROM normal, no CVA tenderness  Lungs:     Clear to auscultation bilaterally, respirations unlabored  Chest wall:    No tenderness or deformity  Heart:    Regular rate and rhythm, harsh systolic murmur  Abdomen:     Soft, non-tender, bowel sounds active all four quadrants,    no masses, no organomegaly  Genitalia:   deferred  Rectal:   deferred  Extremities:   Extremities normal, atraumatic, no cyanosis or edema  Pulses:   2+ and symmetric all extremities  Skin:   Skin color, texture, turgor normal, no rashes or lesions  Lymph  nodes:   Cervical, supraclavicular, and axillary nodes normal  Neurologic:   CNII-XII intact. Normal strength, sensation and reflexes      Throughout. Mild dysarthria           Psych: normal affect Labs on Admission:  Basic Metabolic Panel:  Recent Labs Lab 05/08/14 1201  NA 145  K 3.9  CL 108  CO2 21  GLUCOSE 95  BUN 33*  CREATININE 1.63*  CALCIUM 9.2   Liver Function Tests: No results found for this basename: AST, ALT, ALKPHOS, BILITOT, PROT, ALBUMIN,  in the last 168 hours No results found for this basename: LIPASE, AMYLASE,  in the last 168 hours No results found for this basename: AMMONIA,  in the last 168 hours CBC:  Recent Labs Lab 05/08/14 1201  WBC 19.3*  NEUTROABS 18.0*  HGB 10.7*  HCT 33.1*  MCV 87.6  PLT 127*   Cardiac Enzymes: No results found for this basename: CKTOTAL, CKMB, CKMBINDEX, TROPONINI,  in the last 168 hours  BNP (last 3 results) No results found for this basename: PROBNP,  in the last 8760 hours CBG:  Recent Labs Lab 05/08/14 1156  GLUCAP 97    Radiological Exams on Admission: Mr Brain Wo Contrast  05/08/2014   CLINICAL DATA:  Aphasia.  Gait problem.  Slurred speech.  EXAM: MRI HEAD WITHOUT CONTRAST  TECHNIQUE: Multiplanar, multiecho pulse sequences of the brain and surrounding structures were obtained without intravenous contrast.  COMPARISON:  MRI 03/10/2012  FINDINGS: Generalized atrophy with progression from the prior study. Chronic ischemic changes throughout the cerebral white matter and basal ganglia, similar to the prior study. Chronic infarct left occipital cortex is unchanged.  Negative for acute infarct. Negative for mass. Chronic hemorrhagic infarction in the right internal capsule. Chronic micro hemorrhage in the thalamus bilaterally. No shift of the midline structures.  Mild mucosal thickening in the paranasal sinuses.  IMPRESSION: Mild progression of atrophy.  Moderate chronic ischemic changes.  No acute infarct.    Electronically Signed   By: Franchot Gallo M.D.   On: 05/08/2014 13:58    Assessment/Plan UTI: continue rocephin. Active Problems: Reported slurred speech. Per family, speech back to baseline. No evidence of stroke. Could be secondary to UTI, dehydration with dry mucous membranes   Moderate aortic stenosis: recently seen by Dr. Harrington Challenger   Diastolic dysfunction, left ventricle   Hypertension: hold meds to avoid hypotension in the setting of UTI and dehydration   Renal insufficiency: baseline creatinine unknown.  Likely in part, prerenal   Dehydration: IVF.    Anemia   Thrombocytopenia  Code Status: full Family Communication: multiple at bedside Disposition Plan: *home  Time spent: 70 min  Delfina Redwood, MD [facility] Pager [phone]

## 2014-05-08 NOTE — ED Notes (Signed)
Family at bedside. 

## 2014-05-08 NOTE — ED Notes (Signed)
Daughter now states that pt was last seen normal yesterday. Slurred speech was identified by wife at 0700 this morning

## 2014-05-08 NOTE — ED Notes (Signed)
Patient transported to MRI and CT.  

## 2014-05-08 NOTE — ED Provider Notes (Signed)
CSN: [id number]     Arrival date & time 05/08/14  29 History   First MD Initiated Contact with Patient 05/08/14 1102     Chief Complaint  Patient presents with  . Aphasia  . Gait Problem  . Testicle Pain     (Consider location/radiation/quality/duration/timing/severity/associated sxs/prior Treatment) HPI  This is a 73 year old male with history hypertension, hyperlipidemia, congestive heart failure, and stroke with residual right-sided deficits who presents with slurred speech and unsteady gait. Per the patient's family, they noted the symptoms yesterday.  They stated "he has really been right since last Thursday." At this time he has right facial droop and some slurred speech. However, the daughter states the slurred speech and was noted to be worse this morning when he woke up. Patient is not appreciating any changes in his speech and denies ever finding difficulty. Has no complaints at this time. The daughter states that she also complained of testicle pain days ago. Currently he denies any pain. The denies recent fevers. History of UTI several months ago.  Past Medical History  Diagnosis Date  . Stroke   . Hypertension   . Hyperlipidemia   . Aortic stenosis   . CHF (congestive heart failure)   . MGUS (monoclonal gammopathy of unknown significance)   . Goiter   . Rhabdomyolysis    Past Surgical History  Procedure Laterality Date  . Circumcision     No family history on file. History  Substance Use Topics  . Smoking status: Former Research scientist (life sciences)  . Smokeless tobacco: Not on file  . Alcohol Use: No    Review of Systems  Constitutional: Negative for fever.  Respiratory: Negative.  Negative for chest tightness and shortness of breath.   Cardiovascular: Negative.  Negative for chest pain.  Gastrointestinal: Negative.  Negative for abdominal pain.  Genitourinary: Positive for testicular pain. Negative for dysuria.  Musculoskeletal: Negative for back pain.  Neurological: Positive  for speech difficulty and weakness. Negative for dizziness, light-headedness and headaches.  All other systems reviewed and are negative.     Allergies  Review of patient's allergies indicates no known allergies.  Home Medications   Prior to Admission medications   Medication Sig Start Date End Date Taking? Authorizing Provider  amLODipine (NORVASC) 10 MG tablet Take 10 mg by mouth daily.   Yes Historical Provider, MD  aspirin 81 MG tablet Take 81 mg by mouth daily.   Yes Historical Provider, MD  cloNIDine (CATAPRES - DOSED IN MG/24 HR) 0.3 mg/24hr Place 1 patch onto the skin once a week. Sundays   Yes Historical Provider, MD  furosemide (LASIX) 40 MG tablet Take 20 mg by mouth 2 (two) times daily.    Yes Historical Provider, MD  hydrALAZINE (APRESOLINE) 50 MG tablet Take 50-100 mg by mouth 2 (two) times daily. 100 mg in the morning and 50 mg in the evening   Yes Historical Provider, MD  irbesartan (AVAPRO) 300 MG tablet Take 300 mg by mouth daily.   Yes Historical Provider, MD  labetalol (NORMODYNE) 300 MG tablet Take 600 mg by mouth 2 (two) times daily.   Yes Historical Provider, MD  pravastatin (PRAVACHOL) 20 MG tablet Take 1 tablet (20 mg total) by mouth every evening. 04/30/12  Yes Fay Records, MD  Tamsulosin HCl (FLOMAX) 0.4 MG CAPS Take 0.4 mg by mouth daily.   Yes Historical Provider, MD   BP 116/57  Pulse 58  Temp(Src) 99.1 F (37.3 C) (Oral)  Resp 18  Ht 5\' 5"  (  1.651 m)  Wt 154 lb (69.854 kg)  BMI 25.63 kg/m2  SpO2 99% Physical Exam  Nursing note and vitals reviewed. Constitutional: No distress.  Chronically ill appearing, elderly  HENT:  Head: Normocephalic and atraumatic.  Mouth/Throat: Oropharynx is clear and moist.  Eyes: Pupils are equal, round, and reactive to light.  Pupils 58mm reactive  Neck: Neck supple.  Cardiovascular: Normal rate, regular rhythm and normal heart sounds.   No murmur heard. Pulmonary/Chest: Effort normal and breath sounds normal. No  respiratory distress. He has no wheezes.  Abdominal: Soft. There is no tenderness.  Genitourinary:  Abnormal testicular exam, positive cremasteric reflex, no tenderness to palpation or crepitus noted  Musculoskeletal: He exhibits no edema.  Lymphadenopathy:    He has no cervical adenopathy.  Neurological: He is alert.  Slight facial droop noted, 5 out of 5 bilateral upper extremity grip strength in 5 of 5 lower extremity hip flexor strength, patient appears to have some dysarthria but can name and repeat, oriented x2  Skin: Skin is warm and dry.  Psychiatric: He has a normal mood and affect.    ED Course  Procedures (including critical care time) Labs Review Labs Reviewed  CBC WITH DIFFERENTIAL - Abnormal; Notable for the following:    WBC 19.3 (*)    RBC 3.78 (*)    Hemoglobin 10.7 (*)    HCT 33.1 (*)    Platelets 127 (*)    Neutrophils Relative % 93 (*)    Neutro Abs 18.0 (*)    Lymphocytes Relative 3 (*)    Lymphs Abs 0.5 (*)    All other components within normal limits  URINALYSIS, ROUTINE W REFLEX MICROSCOPIC - Abnormal; Notable for the following:    APPearance CLOUDY (*)    Hgb urine dipstick SMALL (*)    Protein, ur 30 (*)    Leukocytes, UA MODERATE (*)    All other components within normal limits  BASIC METABOLIC PANEL - Abnormal; Notable for the following:    BUN 33 (*)    Creatinine, Ser 1.63 (*)    GFR calc non Af Amer 40 (*)    GFR calc Af Amer 47 (*)    All other components within normal limits  URINE MICROSCOPIC-ADD ON - Abnormal; Notable for the following:    Squamous Epithelial / LPF FEW (*)    Bacteria, UA FEW (*)    Casts HYALINE CASTS (*)    All other components within normal limits  CULTURE, BLOOD (ROUTINE X 2)  CULTURE, BLOOD (ROUTINE X 2)  URINE CULTURE  CBG MONITORING, ED    Imaging Review Mr Brain Wo Contrast  05/08/2014   CLINICAL DATA:  Aphasia.  Gait problem.  Slurred speech.  EXAM: MRI HEAD WITHOUT CONTRAST  TECHNIQUE: Multiplanar,  multiecho pulse sequences of the brain and surrounding structures were obtained without intravenous contrast.  COMPARISON:  MRI 03/10/2012  FINDINGS: Generalized atrophy with progression from the prior study. Chronic ischemic changes throughout the cerebral white matter and basal ganglia, similar to the prior study. Chronic infarct left occipital cortex is unchanged.  Negative for acute infarct. Negative for mass. Chronic hemorrhagic infarction in the right internal capsule. Chronic micro hemorrhage in the thalamus bilaterally. No shift of the midline structures.  Mild mucosal thickening in the paranasal sinuses.  IMPRESSION: Mild progression of atrophy.  Moderate chronic ischemic changes.  No acute infarct.   Electronically Signed   By: Franchot Gallo M.D.   On: 05/08/2014 13:58  EKG Interpretation None      MDM   Final diagnoses:  Urinary tract infection  AKI (acute kidney injury)    Patient presents with slurred speech reported worse than baseline. On exam, he was noted to have a right-sided facial droop. Speech is slow that he can name and repeat. Difficult to assess whether any of these symptoms are acute. Will obtain MRI given last than normal yesterday. Infectious workup also initiated. Patient noted to have a leukocytosis to 19 and an acute kidney injury with a creatinine of 1.6. Patient was given fluids.  MRI without evidence of new acute stroke. Urinalysis concerning for UTI. Will give Rocephin and admit given a aki, leukocytosis and evidence of UTI.    Merryl Hacker, MD 05/08/14 [phone]

## 2014-05-08 NOTE — ED Notes (Signed)
Pt remains off the floor at this time.

## 2014-05-08 NOTE — ED Notes (Signed)
In & out produced 0 ml. ED MD made aware verbal order received

## 2014-05-09 ENCOUNTER — Encounter (HOSPITAL_COMMUNITY): Payer: Self-pay | Admitting: General Practice

## 2014-05-09 DIAGNOSIS — N390 Urinary tract infection, site not specified: Secondary | ICD-10-CM

## 2014-05-09 DIAGNOSIS — N289 Disorder of kidney and ureter, unspecified: Secondary | ICD-10-CM

## 2014-05-09 DIAGNOSIS — I359 Nonrheumatic aortic valve disorder, unspecified: Secondary | ICD-10-CM

## 2014-05-09 DIAGNOSIS — N189 Chronic kidney disease, unspecified: Secondary | ICD-10-CM | POA: Diagnosis present

## 2014-05-09 HISTORY — DX: Urinary tract infection, site not specified: N39.0

## 2014-05-09 LAB — CBC
HCT: 30.6 % — ABNORMAL LOW (ref 39.0–52.0)
Hemoglobin: 10 g/dL — ABNORMAL LOW (ref 13.0–17.0)
MCH: 29.1 pg (ref 26.0–34.0)
MCHC: 32.7 g/dL (ref 30.0–36.0)
MCV: 89 fL (ref 78.0–100.0)
PLATELETS: 124 10*3/uL — AB (ref 150–400)
RBC: 3.44 MIL/uL — AB (ref 4.22–5.81)
RDW: 14.8 % (ref 11.5–15.5)
WBC: 11.5 10*3/uL — ABNORMAL HIGH (ref 4.0–10.5)

## 2014-05-09 LAB — BASIC METABOLIC PANEL
BUN: 32 mg/dL — AB (ref 6–23)
CO2: 22 mEq/L (ref 19–32)
Calcium: 8.7 mg/dL (ref 8.4–10.5)
Chloride: 109 mEq/L (ref 96–112)
Creatinine, Ser: 1.28 mg/dL (ref 0.50–1.35)
GFR, EST AFRICAN AMERICAN: 63 mL/min — AB (ref >90)
GFR, EST NON AFRICAN AMERICAN: 54 mL/min — AB (ref >90)
Glucose, Bld: 79 mg/dL (ref 70–99)
Potassium: 3.5 mEq/L — ABNORMAL LOW (ref 3.7–5.3)
SODIUM: 144 meq/L (ref 137–147)

## 2014-05-09 MED ORDER — LABETALOL HCL 300 MG PO TABS: 300.0000 mg | ORAL_TABLET | Freq: Two times a day (BID) | ORAL | Status: DC

## 2014-05-09 MED ORDER — CIPROFLOXACIN HCL 500 MG PO TABS: 500.0000 mg | ORAL_TABLET | Freq: Two times a day (BID) | ORAL | Status: DC

## 2014-05-09 MED ORDER — FUROSEMIDE 40 MG PO TABS: 20.0000 mg | ORAL_TABLET | Freq: Two times a day (BID) | ORAL | Status: DC

## 2014-05-09 MED ORDER — ENOXAPARIN SODIUM 40 MG/0.4ML ~~LOC~~ SOLN
40.0000 mg | SUBCUTANEOUS | Status: DC
  Filled 2014-05-09 (×2): qty 0.4

## 2014-05-09 NOTE — Discharge Summary (Signed)
Physician Discharge Summary  Bradley [name] [id number]:[id number] DOB: 1941-01-05 DOA: 05/08/2014  PCP: [name], MD  Admit date: 05/08/2014 Discharge date: 05/09/2014  Time spent: 50 minutes  Recommendations for Outpatient Follow-up:  1. UTI with ARF - please check renal panel and cbc 2. Monitor BP - patient falling.  Reduced normodyne to 300 mg bid. 3. HH PT ordered on D/C 4. Continue follow up with Dr. Harrington Challenger regarding severe AS.   Discharge Diagnoses:  Principal Problem:   UTI (lower urinary tract infection) Active Problems:   Dehydration   Acute renal failure   Moderate aortic stenosis   Diastolic dysfunction, left ventricle   Hypertension   Urinary tract infection   Renal insufficiency   Anemia   Thrombocytopenia   Discharge Condition: stable   Diet recommendation: heart healthy  Filed Weights   05/08/14 1058 05/08/14 2101  Weight: 69.854 kg (154 lb) 66 kg (145 lb 8.1 oz)    History of present illness:  Bradley [name] is a 73 y.o. Male, with a past medical history of CVA, CHR, Renal Insufficiency, MGUS, HTN, brought to ED by family who noticed that the patient was much more weaker than usual. He has right sided weakness from previous stroke and has chronic dysarthria. Lives alone and walks with walker. Family thinks he may be eating less than usual. No other symptoms. Patient feels fine and has no complaints. CT brain in ED shows nothing acute. Workup significant for UTI, creatinine of 1.6, WBC 19K. No labs since 2013, creatinine at that time <1. Pt denies dysuria, pain, fever, chills, nausea or vomiting.   Hospital Course:   Urinary tract infection with Acute on Chronic Renal Failure U/A appeared positive.   Urine culture shows NGTD so far.  Dtr [name] states he was on antibiotics recently but can't remember which one. Will continue to follow culture Patient received Rocephin in the hospital.  Will d/c on a short course of cipro. Note rapidly improved upon  hospitalization, close to baseline with short overnight hospital stay. Afebrile, and non toxing looking.  Lasix was held as an inpatient will taper back up to 20 mg BID dose. Will request that Dr. Mercy Moore see him in follow up - the family has a great deal of respect/appreciate for Dr. Mercy Moore.  Recent Fall Dtr reports patient fell on Friday and she tried to bring him to the hospital but he refused. May have been due to infection, progressive weakness, or possibly iatrogenic.  Patient was dehydrated on admission. PT evaluation was completed.  HHPT recommended, and has been ordered.  HTN Patient was dehydrated on admission lasix was held. BP medications were held on admission as well.   BP has remained moderately controlled off of all BP Meds. Previous home regimen:  Normodyne 600 mg bid, Lasix 20 mg bid, Avapro 300 daily, Clonidine 0.3 patch, Hydralazine 100 mg in am and 50 mg in PM. Will decrease Normodyne to 300 mg bid and ask Nephrology,and / or PCP to follow.  I'm concerned that the patient may be slightly overmedicated.  Severe AS with diastolic dysfunction. Appears compensated. On ARB, BB, lasix. Continue to follow with Dr. Dorris Carnes.  Procedures:  None.  Consultations:  None.  Discharge Exam: Filed Vitals:   05/09/14 0620  BP: 164/69  Pulse: 64  Temp: 98.2 F (36.8 C)  Resp: 18    General: Awake, Alert, Appears Well.  Slight dyarthia, mildly childlike. Cardiovascular: RRR.  3/6 systolic murmur Respiratory: no increased work of breathing,  CTA Abdomen:  Soft, NT, ND, +BS, No masses evident Extremities:  No swelling or cyanosis. Skin: No rash or lesions.  Discharge Instructions       Discharge Orders   Future Orders Complete By Expires   Diet - low sodium heart healthy  As directed    Increase activity slowly  As directed        Medication List         amLODipine 10 MG tablet  Commonly known as:  NORVASC  Take 10 mg by mouth daily.     aspirin  81 MG tablet  Take 81 mg by mouth daily.     ciprofloxacin 500 MG tablet  Commonly known as:  CIPRO  Take 1 tablet (500 mg total) by mouth 2 (two) times daily.     cloNIDine 0.3 mg/24hr patch  Commonly known as:  CATAPRES - Dosed in mg/24 hr  Place 1 patch onto the skin once a week. Sundays     furosemide 40 MG tablet  Commonly known as:  LASIX  Take 0.5 tablets (20 mg total) by mouth 2 (two) times daily. Take 1/2 tab once a day on 5/13 and 5/14.  Then on 5/15 resume 1/2 tab twice a day.     hydrALAZINE 50 MG tablet  Commonly known as:  APRESOLINE  Take 50-100 mg by mouth 2 (two) times daily. 100 mg in the morning and 50 mg in the evening     irbesartan 300 MG tablet  Commonly known as:  AVAPRO  Take 300 mg by mouth daily.     labetalol 300 MG tablet  Commonly known as:  NORMODYNE  Take 1 tablet (300 mg total) by mouth 2 (two) times daily.     PRAVACHOL 20 MG tablet  Generic drug:  pravastatin  Take 1 tablet (20 mg total) by mouth every evening.     tamsulosin 0.4 MG Caps capsule  Commonly known as:  FLOMAX  Take 0.4 mg by mouth daily.       No Known Allergies Follow-up Information   Follow up with [name], MD In 4 weeks.   Specialty:  Family Medicine   Contact information:   [place] [address] [phone]       Follow up with Windy Kalata, MD In 1 week.   Specialty:  Nephrology   Contact information:   [place] [address] [phone]        The results of significant diagnostics from this hospitalization (including imaging, microbiology, ancillary and laboratory) are listed below for reference.    Significant Diagnostic Studies: Mr Brain Wo Contrast  05/08/2014   CLINICAL DATA:  Aphasia.  Gait problem.  Slurred speech.  EXAM: MRI HEAD WITHOUT CONTRAST  TECHNIQUE: Multiplanar, multiecho pulse sequences of the brain and surrounding structures were obtained without intravenous contrast.  COMPARISON:   MRI 03/10/2012  FINDINGS: Generalized atrophy with progression from the prior study. Chronic ischemic changes throughout the cerebral white matter and basal ganglia, similar to the prior study. Chronic infarct left occipital cortex is unchanged.  Negative for acute infarct. Negative for mass. Chronic hemorrhagic infarction in the right internal capsule. Chronic micro hemorrhage in the thalamus bilaterally. No shift of the midline structures.  Mild mucosal thickening in the paranasal sinuses.  IMPRESSION: Mild progression of atrophy.  Moderate chronic ischemic changes.  No acute infarct.   Electronically Signed   By: Franchot Gallo M.D.   On: 05/08/2014 13:58  Microbiology: Recent Results (from the past 240 hour(s))  CULTURE, BLOOD (ROUTINE X 2)     Status: None   Collection Time    05/08/14  3:10 PM      Result Value Ref Range Status   Specimen Description BLOOD ARM LEFT   Final   Special Requests BOTTLES DRAWN AEROBIC AND ANAEROBIC 10CC   Final   Culture  Setup Time     Final   Value: 05/08/2014 21:55     Performed at [facility]   Culture     Final   Value:        BLOOD CULTURE RECEIVED NO GROWTH TO DATE CULTURE WILL BE HELD FOR 5 DAYS BEFORE ISSUING A FINAL NEGATIVE REPORT     Performed at [facility]   Report Status PENDING   Incomplete  CULTURE, BLOOD (ROUTINE X 2)     Status: None   Collection Time    05/08/14  5:15 PM      Result Value Ref Range Status   Specimen Description BLOOD HAND LEFT   Final   Special Requests BOTTLES DRAWN AEROBIC ONLY 10CC   Final   Culture  Setup Time     Final   Value: 05/08/2014 21:55     Performed at [facility]   Culture     Final   Value:        BLOOD CULTURE RECEIVED NO GROWTH TO DATE CULTURE WILL BE HELD FOR 5 DAYS BEFORE ISSUING A FINAL NEGATIVE REPORT     Performed at [facility]   Report Status PENDING   Incomplete     Labs: Basic Metabolic Panel:  Recent Labs Lab 05/08/14 1201 05/09/14 0552  NA  145 144  K 3.9 3.5*  CL 108 109  CO2 21 22  GLUCOSE 95 79  BUN 33* 32*  CREATININE 1.63* 1.28  CALCIUM 9.2 8.7   CBC:  Recent Labs Lab 05/08/14 1201 05/09/14 0552  WBC 19.3* 11.5*  NEUTROABS 18.0*  --   HGB 10.7* 10.0*  HCT 33.1* 30.6*  MCV 87.6 89.0  PLT 127* 124*   CBG:  Recent Labs Lab 05/08/14 1156  GLUCAP 97       Signed:  Karen Kitchens [phone]  [facility] 05/09/2014, 11:36 AM  Attending Seen and examined, agree with the above assessment and plan. Rapidly improved overnight since admission with just IV fluids and IV Rocephin. Almost back to her usual baseline, family agreeable for discharge with home health services. Urine culture is still pending, however was switched to oral ciprofloxacin. Please note, family/daughter aware that in case patient has a multi-drug resistant organism, he may need to come back to the hospital for IV antibiotics. Family and patient agreeable discharge at this time.  Nena Alexander MD

## 2014-05-09 NOTE — Care Management Note (Signed)
    Page 1 of 1   05/09/2014     6:39:18 PM CARE MANAGEMENT NOTE 05/09/2014  Patient:  Bradley [name], Bradley [name]   Account Number:  [id number]  Date Initiated:  05/09/2014  Documentation initiated by:  Tomi Bamberger  Subjective/Objective Assessment:   dx severe AS, chronic dysarthia, uti  admit-lives alone.     Action/Plan:   pt eval- rec hhpt   Anticipated DC Date:  05/10/2014   Anticipated DC Plan:  [place]  CM consult      Choice offered to / List presented to:             Status of service:  In process, will continue to follow Medicare Important Message given?   (If response is "NO", the following Medicare IM given date fields will be blank) Date Medicare IM given:   Date Additional Medicare IM given:    Discharge Disposition:    Per UR Regulation:    If discussed at Long Length of Stay Meetings, dates discussed:    Comments:  05/09/14 [place] RN, BSN [phone] patient lives alone, per physical therapy recs hhpt, will need to be set up.

## 2014-05-09 NOTE — Discharge Instructions (Signed)
BP medication has been decreased (Normodyne) from 600 mg twice a day to 300 mg twice a day.  Please see Dr. Mercy Moore in the next week for follow up for the UTI (with Acute Renal Failure) and the BP medication changes.

## 2014-05-09 NOTE — Evaluation (Signed)
Physical Therapy Evaluation Patient Details Name: Bradley [name] MRN: [id number] DOB: [birth year] Today's Date: 05/09/2014   History of Present Illness  Patient is a 73 y/o male admitted with slurred speech and work up positive for UTI.  H/o CVA right sidede weakness.  Clinical Impression  Patient presents with decreased independence and safety with mobility. He will benefit from skilled PT in the acute setting to allow return home alone with intermittent assist at d/c.  Has his own routine and feel likely will do better with mobilizing in his home.  Has assistance for stairs when leaving his apartment.  Grandaughter present at eval and reports mobilizing close to baseline though he does seem unsteady and needs frequent cues for walker safety.    Follow Up Recommendations Home health PT;Supervision - Intermittent    Equipment Recommendations  None recommended by PT    Recommendations for Other Services       Precautions / Restrictions Precautions Precautions: Fall Precaution Comments: fell Friday prior to admit due to stepping off curb with walker only halfway off while daughter trying to get door open on car.      Mobility  Bed Mobility Overal bed mobility: Needs Assistance Bed Mobility: Supine to Sit     Supine to sit: Min guard     General bed mobility comments: assist due to bed with hump at foot of bed and sunken in center of bed, used bed rail and minguard for safety due to posterior bias  Transfers Overall transfer level: Needs assistance   Transfers: Sit to/from Stand Sit to Stand: Supervision;Min guard         General transfer comment: assist for safety due to patient pulls up on walker (held walker for safety), cues to sit safely reaching for armrests on chair  Ambulation/Gait Ambulation/Gait assistance: Min guard;Supervision Ambulation Distance (Feet): 200 Feet Assistive device: Rolling walker (2 wheeled) Gait Pattern/deviations: Step-to pattern;Decreased  stride length;Shuffle;Decreased stance time - right;Decreased step length - right     General Gait Details: right foot dragging floor; frequent cues to stay inside walker; unsteady with ambulation but continues to assert no falls at home  Stairs Stairs: Yes Stairs assistance: Supervision Stair Management: Two rails;Alternating pattern;Step to pattern;Forwards Number of Stairs: 10 General stair comments: ascending stairs with step through pattern; relied heavily on rails for support and balance as leaning back at times; descending stairs with step to pattern leading with right; cues for anterior weight shift and assist for foot stability on stair due to leaning back and risk for right foot sliding off stair.  Wheelchair Mobility    Modified Rankin (Stroke Patients Only)       Balance Overall balance assessment: History of Falls;Needs assistance   Sitting balance-Leahy Scale: Fair     Standing balance support: Bilateral upper extremity supported Standing balance-Leahy Scale: Poor Standing balance comment: needs UE assist for balance with ambulation; can stand unsupported without walker                             Pertinent Vitals/Pain Denies pain    Home Living Family/patient expects to be discharged to:: Private residence Living Arrangements: Alone Available Help at Discharge: Family;Available PRN/intermittently Type of Home: Apartment Home Access: Stairs to enter Entrance Stairs-Rails: Right;Left;Can reach both Entrance Stairs-Number of Steps: 16 Home Layout: One level Home Equipment: Walker - 2 wheels;Bedside commode      Prior Function Level of Independence: Independent with assistive device(s)  Comments: assist for bringing walker up or down stairs when leaving or arriving at apartment.  Daughter assists with meals and homemaking     Hand Dominance        Extremity/Trunk Assessment   Upper Extremity Assessment: RUE deficits/detail RUE  Deficits / Details: edema noted at upper arm, with antecubital IV; RN aware and disconnected IV; increased tone noted throughout with decreased coordination         Lower Extremity Assessment: RLE deficits/detail RLE Deficits / Details: extensor tone and decreased knee flexion (approx 95 degrees flexion)    Cervical / Trunk Assessment: Kyphotic;Other exceptions  Communication   Communication: No difficulties  Cognition Arousal/Alertness: Awake/alert Behavior During Therapy: Impulsive Overall Cognitive Status: History of cognitive impairments - at baseline                      General Comments      Exercises        Assessment/Plan    PT Assessment Patient needs continued PT services  PT Diagnosis Abnormality of gait   PT Problem List Decreased strength;Decreased balance;Decreased safety awareness;Decreased knowledge of use of DME;Decreased mobility  PT Treatment Interventions DME instruction;Gait training;Stair training;Functional mobility training;Patient/family education;Therapeutic activities;Therapeutic exercise;Balance training   PT Goals (Current goals can be found in the Care Plan section) Acute Rehab PT Goals Patient Stated Goal: To return home PT Goal Formulation: With patient/family Time For Goal Achievement: 05/16/14 Potential to Achieve Goals: Good    Frequency Min 3X/week   Barriers to discharge Decreased caregiver support      Co-evaluation               End of Session Equipment Utilized During Treatment: Gait belt Activity Tolerance: Patient tolerated treatment well Patient left: in chair;with call bell/phone within reach;with family/visitor present           Time: 6606-3016 PT Time Calculation (min): 32 min   Charges:   PT Evaluation $Initial PT Evaluation Tier I: 1 Procedure PT Treatments $Gait Training: 8-22 mins   PT G CodesMax Sane 05/09/2014, 10:16 AM Magda Kiel, [place] 05/09/2014

## 2014-05-09 NOTE — Progress Notes (Signed)
Pt admitted to unit, 5w25. Daughter and son in law at patient bedside with pt belongings. Pt alert and oriented to self and place, presenting with slurred speech which daughter states is normal. IV intact, skin intact. Pt has healed blister to left foot. Pt and family instructed on how to get into contact with nursing staff, able to show return demonstration. Call bell within reach, bed alarm in place. Will continue to monitor patient per MD orders.

## 2014-05-10 DIAGNOSIS — D649 Anemia, unspecified: Secondary | ICD-10-CM

## 2014-05-10 DIAGNOSIS — N179 Acute kidney failure, unspecified: Secondary | ICD-10-CM

## 2014-05-10 LAB — URINE CULTURE

## 2014-05-10 MED ORDER — CIPROFLOXACIN HCL 500 MG PO TABS: 500.0000 mg | ORAL_TABLET | Freq: Two times a day (BID) | ORAL | Status: DC

## 2014-05-10 MED ORDER — HYDRALAZINE HCL 20 MG/ML IJ SOLN
5.0000 mg | INTRAMUSCULAR | Status: DC | PRN
Start: 2014-05-10 — End: 2014-05-10
  Administered 2014-05-10: 5 mg via INTRAVENOUS
  Filled 2014-05-10 (×2): qty 1

## 2014-05-10 MED ORDER — CIPROFLOXACIN HCL 500 MG PO TABS
500.0000 mg | ORAL_TABLET | Freq: Two times a day (BID) | ORAL | Status: DC
  Filled 2014-05-10 (×3): qty 1

## 2014-05-10 MED ORDER — LORAZEPAM 2 MG/ML IJ SOLN
0.5000 mg | Freq: Once | INTRAMUSCULAR | Status: AC
  Administered 2014-05-10: 0.5 mg via INTRAVENOUS
  Filled 2014-05-10: qty 1

## 2014-05-10 MED ORDER — HYDRALAZINE HCL 50 MG PO TABS
50.0000 mg | ORAL_TABLET | Freq: Every day | ORAL | Status: DC
  Filled 2014-05-10: qty 1

## 2014-05-10 MED ORDER — HYDRALAZINE HCL 20 MG/ML IJ SOLN
5.0000 mg | Freq: Once | INTRAMUSCULAR | Status: AC
Start: 2014-05-10 — End: 2014-05-10
  Administered 2014-05-10: 5 mg via INTRAVENOUS

## 2014-05-10 MED ORDER — AMLODIPINE BESYLATE 10 MG PO TABS
10.0000 mg | ORAL_TABLET | Freq: Every day | ORAL | Status: DC
  Administered 2014-05-10: 10 mg via ORAL
  Filled 2014-05-10: qty 1

## 2014-05-10 MED ORDER — HYDRALAZINE HCL 50 MG PO TABS
100.0000 mg | ORAL_TABLET | Freq: Every day | ORAL | Status: DC
  Administered 2014-05-10: 100 mg via ORAL
  Filled 2014-05-10: qty 2

## 2014-05-10 NOTE — Progress Notes (Signed)
PROGRESS NOTE  Bradley [name] QVZ:[id number] DOB: February 12, 1941 DOA: 05/08/2014 PCP: [name], MD  Bradley [name] is a 73 y.o. Male, with a past medical history of CVA, CHR, Renal Insufficiency, MGUS, HTN, brought to ED by family who noticed that the patient was much more weaker than usual. He has right sided weakness from previous stroke and has chronic dysarthria. Lives alone and walks with walker. Family thinks he may be eating less than usual. No other symptoms. Patient feels fine and has no complaints. CT brain in ED shows nothing acute. Workup significant for UTI, creatinine of 1.6, WBC 19K. No labs since 2013, creatinine at that time <1. Pt denies dysuria, pain, fever, chills, nausea or vomiting.  05/10/2014:  Patient remained in the hospital overnight pending culture and sensitivity results  Assessment/Plan:  Urinary tract infection with Acute on Chronic Renal Failure  U/A appeared positive.  Culture showed E-coli sensitive to Cipro. Patient received Rocephin in the hospital. Will d/c on cipro. Note rapidly improved upon hospitalization, close to baseline with short overnight hospital stay. Afebrile, and non toxing looking.  Lasix was held as an inpatient will taper back up to 20 mg BID dose.  Will request that Dr. Mercy Moore see him in follow up - the family has a great deal of respect/appreciate for Dr. Mercy Moore.   Recent Fall  Dtr reports patient fell on Friday and she tried to bring him to the hospital but he refused.  May have been due to infection, progressive weakness, or possibly iatrogenic. Patient was dehydrated on admission.  PT evaluation was completed. HHPT recommended, and has been ordered.   HTN  Patient was dehydrated on admission lasix was held.  BP medications were held on admission as well.  BP has remained moderately controlled off of all BP Meds.  Previous home regimen: Normodyne 600 mg bid, Lasix 20 mg bid, Avapro 300 daily, Clonidine 0.3 patch,  Hydralazine 100 mg in am and 50 mg in PM.  Will decrease Normodyne to 300 mg bid and ask Nephrology,and / or PCP to follow.  Daughter manages his BP meds & states she has been adjusting them.  Severe AS with diastolic dysfunction.  Appears compensated.  On ARB, BB, lasix.  Continue to follow with Dr. Dorris Carnes.   Goiter Patient has a large mass on the left side of his neck.  Non tender.  Does not interfere with swallowing. Previous w/u in epic includes cytology the indicated cystic mass, goiter vs thyroid carcinoma. Per daughter the workup was completed and mass is benign.  No further work up needed.   DVT Prophylaxis:  lovenox  Code Status: full Family Communication: Daughter, [name], at bedside. Disposition Plan: to home today with home health PT.   Antibiotics: Anti-infectives   Start     Dose/Rate Route Frequency Ordered Stop   05/10/14 1015  ciprofloxacin (CIPRO) tablet 500 mg    Comments:  Please give a dose before he goes.   500 mg Oral 2 times daily 05/10/14 1014     05/10/14 0000  ciprofloxacin (CIPRO) 500 MG tablet     500 mg Oral 2 times daily 05/10/14 1036     05/09/14 1500  cefTRIAXone (ROCEPHIN) 1 g in dextrose 5 % 50 mL IVPB  Status:  Discontinued     1 g 100 mL/hr over 30 Minutes Intravenous Every 24 hours 05/08/14 2111 05/10/14 1015   05/09/14 0000  ciprofloxacin (CIPRO) 500 MG tablet  Status:  Discontinued  500 mg Oral 2 times daily 05/09/14 1112 05/10/14    05/08/14 1630  cefTRIAXone (ROCEPHIN) 1 g in dextrose 5 % 50 mL IVPB     1 g 100 mL/hr over 30 Minutes Intravenous  Once 05/08/14 1627 05/08/14 2030       Objective: Filed Vitals:   05/09/14 2142 05/10/14 0505 05/10/14 0628 05/10/14 0830  BP: 181/85 185/72 188/81 177/81  Pulse: 67 72  63  Temp: 98 F (36.7 C) 98 F (36.7 C)  98.5 F (36.9 C)  TempSrc: Oral Oral  Oral  Resp: 18 18    Height:      Weight:      SpO2: 98% 98%  97%    Intake/Output Summary (Last 24 hours) at 05/10/14  1040 Last data filed at 05/10/14 0000  Gross per 24 hour  Intake      0 ml  Output    650 ml  Net   -650 ml   Filed Weights   05/08/14 1058 05/08/14 2101  Weight: 69.854 kg (154 lb) 66 kg (145 lb 8.1 oz)    Exam: General: Well developed, well nourished, NAD, appears stated age, sitting in chair eating breakfast. HEENT:  PERR, EOMI, Anicteic Sclera, MMM. No pharyngeal erythema or exudates  Neck: Supple, no JVD, large mass on left side of neck.  Not hard, non tender. Cardiovascular: RRR, S1 S2 auscultated, no rubs, murmurs or gallops.   Respiratory: Clear to auscultation bilaterally with equal chest rise  Abdomen: Soft, nontender, nondistended, + bowel sounds  Extremities: warm dry without cyanosis clubbing or edema.  Neuro: AAOx3, cranial nerves grossly intact. Strength 5/5 in upper and lower extremities  Skin: Without rashes exudates or nodules.   Psych: Normal affect and demeanor with intact judgement and insight       Data Reviewed: Basic Metabolic Panel:  Recent Labs Lab 05/08/14 1201 05/09/14 0552  NA 145 144  K 3.9 3.5*  CL 108 109  CO2 21 22  GLUCOSE 95 79  BUN 33* 32*  CREATININE 1.63* 1.28  CALCIUM 9.2 8.7   CBC:  Recent Labs Lab 05/08/14 1201 05/09/14 0552  WBC 19.3* 11.5*  NEUTROABS 18.0*  --   HGB 10.7* 10.0*  HCT 33.1* 30.6*  MCV 87.6 89.0  PLT 127* 124*   CBG:  Recent Labs Lab 05/08/14 1156  GLUCAP 97    Recent Results (from the past 240 hour(s))  CULTURE, BLOOD (ROUTINE X 2)     Status: None   Collection Time    05/08/14  3:10 PM      Result Value Ref Range Status   Specimen Description BLOOD ARM LEFT   Final   Special Requests BOTTLES DRAWN AEROBIC AND ANAEROBIC 10CC   Final   Culture  Setup Time     Final   Value: 05/08/2014 21:55     Performed at [facility]   Culture     Final   Value:        BLOOD CULTURE RECEIVED NO GROWTH TO DATE CULTURE WILL BE HELD FOR 5 DAYS BEFORE ISSUING A FINAL NEGATIVE REPORT      Performed at [facility]   Report Status PENDING   Incomplete  URINE CULTURE     Status: None   Collection Time    05/08/14  3:52 PM      Result Value Ref Range Status   Specimen Description URINE, RANDOM   Final   Special Requests NONE   Final  Culture  Setup Time     Final   Value: 05/08/2014 17:26     Performed at [place]     Final   Value: >=100,000 COLONIES/ML     Performed at [facility]   Culture     Final   Value: ACINETOBACTER CALCOACETICUS/BAUMANNII COMPLEX     Performed at [facility]   Report Status 05/10/2014 FINAL   Final   Organism ID, Bacteria ACINETOBACTER CALCOACETICUS/BAUMANNII COMPLEX   Final  CULTURE, BLOOD (ROUTINE X 2)     Status: None   Collection Time    05/08/14  5:15 PM      Result Value Ref Range Status   Specimen Description BLOOD HAND LEFT   Final   Special Requests BOTTLES DRAWN AEROBIC ONLY 10CC   Final   Culture  Setup Time     Final   Value: 05/08/2014 21:55     Performed at [facility]   Culture     Final   Value:        BLOOD CULTURE RECEIVED NO GROWTH TO DATE CULTURE WILL BE HELD FOR 5 DAYS BEFORE ISSUING A FINAL NEGATIVE REPORT     Performed at [facility]   Report Status PENDING   Incomplete     Studies: Mr Brain Wo Contrast  05/08/2014   CLINICAL DATA:  Aphasia.  Gait problem.  Slurred speech.  EXAM: MRI HEAD WITHOUT CONTRAST  TECHNIQUE: Multiplanar, multiecho pulse sequences of the brain and surrounding structures were obtained without intravenous contrast.  COMPARISON:  MRI 03/10/2012  FINDINGS: Generalized atrophy with progression from the prior study. Chronic ischemic changes throughout the cerebral white matter and basal ganglia, similar to the prior study. Chronic infarct left occipital cortex is unchanged.  Negative for acute infarct. Negative for mass. Chronic hemorrhagic infarction in the right internal capsule. Chronic micro hemorrhage in the thalamus  bilaterally. No shift of the midline structures.  Mild mucosal thickening in the paranasal sinuses.  IMPRESSION: Mild progression of atrophy.  Moderate chronic ischemic changes.  No acute infarct.   Electronically Signed   By: Franchot Gallo M.D.   On: 05/08/2014 13:58    Scheduled Meds: . amLODipine  10 mg Oral Daily  . aspirin  81 mg Oral Daily  . ciprofloxacin  500 mg Oral BID  . [START ON 05/14/2014] cloNIDine  0.3 mg Transdermal Q Sun  . enoxaparin (LOVENOX) injection  40 mg Subcutaneous Q24H  . hydrALAZINE  100 mg Oral Daily  . hydrALAZINE  50 mg Oral QHS  . labetalol  300 mg Oral BID  . simvastatin  10 mg Oral q1800  . tamsulosin  0.4 mg Oral Daily   Continuous Infusions:   Principal Problem:   UTI (lower urinary tract infection) Active Problems:   Dehydration   Acute renal failure   Moderate aortic stenosis   Diastolic dysfunction, left ventricle   Hypertension   Urinary tract infection   Renal insufficiency   Anemia   Thrombocytopenia    Melton Alar, PA-C  [facility] Pager [phone]. If 7PM-7AM, please contact night-coverage at [email or web address], password [facility] 05/10/2014, 10:40 AM  LOS: 2 days

## 2014-05-10 NOTE — Progress Notes (Signed)
CARE MANAGEMENT NOTE 05/10/2014  Patient:  Bradley [name], Bradley [name]   Account Number:  [id number]  Date Initiated:  05/09/2014  Documentation initiated by:  Tomi Bamberger  Subjective/Objective Assessment:   dx severe AS, chronic dysarthia, uti  admit-lives alone.     Action/Plan:   pt eval- rec hhpt   Anticipated DC Date:  05/10/2014   Anticipated DC Plan:  [place]  CM consult      [facility] Choice  HOME HEALTH   Choice offered to / List presented to:  C-4 Adult Children        HH arranged  HH-2 PT      [place].   Status of service:  Completed, signed off Medicare Important Message given?  NA - LOS <3 / Initial given by admissions (If response is "NO", the following Medicare IM given date fields will be blank) Date Medicare IM given:   Date Additional Medicare IM given:    Discharge Disposition:  [place]  Per UR Regulation:    If discussed at Long Length of Stay Meetings, dates discussed:    Comments:  05/10/2014 1027 NCM spoke to pt and gave permission to speak dtr, Lurena Joiner # [phone]. States pt has RW and 3n1 at home. Offered choice for [facility] and requesting [facility] for [facility] PT. NCM notified [place] for [place]. Jonnie Finner RN CCM Case Mgmt phone [phone]  05/09/14 [place] RN, BSN [phone] patient lives alone, per physical therapy recs hhpt, will need to be set up.

## 2014-05-10 NOTE — Progress Notes (Signed)
DC home with daughter, verbally understood DC instructions, no questions asked 

## 2014-05-10 NOTE — Progress Notes (Signed)
Pt's BP elevated earlier - prn med admin. BP still not come down. On call MD paged for further orders.

## 2014-05-10 NOTE — Progress Notes (Signed)
On call physician paged for anxiety/agitation med - pt attempting to get out of bed. Bed alarm on. IV med ordered.

## 2014-05-12 NOTE — Progress Notes (Signed)
Addendum  Patient seen and examined, chart and data base reviewed.  I agree with the above assessment and plan.  For full details please see Mrs. Imogene Burn PA note.  UTI, Escherichia coli discharge on Cipro.   Birdie Hopes, MD [facility] Pager: [phone] 05/12/2014, 3:47 PM

## 2014-05-14 LAB — CULTURE, BLOOD (ROUTINE X 2)
CULTURE: NO GROWTH
Culture: NO GROWTH

## 2014-10-20 ENCOUNTER — Encounter: Payer: Self-pay | Admitting: Internal Medicine

## 2014-11-16 ENCOUNTER — Emergency Department (HOSPITAL_COMMUNITY): Payer: Medicare Other

## 2014-11-16 ENCOUNTER — Encounter (HOSPITAL_COMMUNITY): Payer: Self-pay | Admitting: *Deleted

## 2014-11-16 ENCOUNTER — Emergency Department (HOSPITAL_COMMUNITY)
Admission: EM | Admit: 2014-11-16 | Discharge: 2014-11-16 | Disposition: A | Discharge status: Home / Self Care | Payer: Medicare Other | Attending: Emergency Medicine | Admitting: Emergency Medicine

## 2014-11-16 DIAGNOSIS — Z7982 Long term (current) use of aspirin: Secondary | ICD-10-CM | POA: Diagnosis not present

## 2014-11-16 DIAGNOSIS — I10 Essential (primary) hypertension: Secondary | ICD-10-CM | POA: Diagnosis not present

## 2014-11-16 DIAGNOSIS — Z79899 Other long term (current) drug therapy: Secondary | ICD-10-CM | POA: Insufficient documentation

## 2014-11-16 DIAGNOSIS — R600 Localized edema: Secondary | ICD-10-CM | POA: Diagnosis not present

## 2014-11-16 DIAGNOSIS — J069 Acute upper respiratory infection, unspecified: Secondary | ICD-10-CM

## 2014-11-16 DIAGNOSIS — Z862 Personal history of diseases of the blood and blood-forming organs and certain disorders involving the immune mechanism: Secondary | ICD-10-CM | POA: Diagnosis not present

## 2014-11-16 DIAGNOSIS — R531 Weakness: Secondary | ICD-10-CM | POA: Diagnosis present

## 2014-11-16 DIAGNOSIS — I509 Heart failure, unspecified: Secondary | ICD-10-CM | POA: Insufficient documentation

## 2014-11-16 DIAGNOSIS — Z87440 Personal history of urinary (tract) infections: Secondary | ICD-10-CM | POA: Diagnosis not present

## 2014-11-16 DIAGNOSIS — Z8739 Personal history of other diseases of the musculoskeletal system and connective tissue: Secondary | ICD-10-CM | POA: Diagnosis not present

## 2014-11-16 DIAGNOSIS — E785 Hyperlipidemia, unspecified: Secondary | ICD-10-CM | POA: Diagnosis not present

## 2014-11-16 DIAGNOSIS — R011 Cardiac murmur, unspecified: Secondary | ICD-10-CM | POA: Diagnosis not present

## 2014-11-16 DIAGNOSIS — I69392 Facial weakness following cerebral infarction: Secondary | ICD-10-CM | POA: Insufficient documentation

## 2014-11-16 LAB — BASIC METABOLIC PANEL
ANION GAP: 15 (ref 5–15)
BUN: 29 mg/dL — ABNORMAL HIGH (ref 6–23)
CALCIUM: 10.2 mg/dL (ref 8.4–10.5)
CO2: 24 meq/L (ref 19–32)
Chloride: 105 mEq/L (ref 96–112)
Creatinine, Ser: 1.11 mg/dL (ref 0.50–1.35)
GFR calc non Af Amer: 64 mL/min — ABNORMAL LOW (ref >90)
GFR, EST AFRICAN AMERICAN: 75 mL/min — AB (ref >90)
Glucose, Bld: 124 mg/dL — ABNORMAL HIGH (ref 70–99)
Potassium: 3.9 mEq/L (ref 3.7–5.3)
SODIUM: 144 meq/L (ref 137–147)

## 2014-11-16 LAB — CBC
HEMATOCRIT: 32 % — AB (ref 39.0–52.0)
Hemoglobin: 10.3 g/dL — ABNORMAL LOW (ref 13.0–17.0)
MCH: 27.5 pg (ref 26.0–34.0)
MCHC: 32.2 g/dL (ref 30.0–36.0)
MCV: 85.3 fL (ref 78.0–100.0)
PLATELETS: 154 10*3/uL (ref 150–400)
RBC: 3.75 MIL/uL — ABNORMAL LOW (ref 4.22–5.81)
RDW: 13.6 % (ref 11.5–15.5)
WBC: 8.7 10*3/uL (ref 4.0–10.5)

## 2014-11-16 LAB — PRO B NATRIURETIC PEPTIDE: PRO B NATRI PEPTIDE: 5173 pg/mL — AB (ref 0–125)

## 2014-11-16 LAB — I-STAT CG4 LACTIC ACID, ED: Lactic Acid, Venous: 1.05 mmol/L (ref 0.5–2.2)

## 2014-11-16 MED ORDER — DM-GUAIFENESIN ER 30-600 MG PO TB12: 1.0000 | ORAL_TABLET | Freq: Two times a day (BID) | ORAL | Status: DC | PRN

## 2014-11-16 NOTE — ED Provider Notes (Addendum)
CSN: [id number]     Arrival date & time 11/16/14  1818 History   First MD Initiated Contact with Patient 11/16/14 1934     Chief Complaint  Patient presents with  . Weakness     (Consider location/radiation/quality/duration/timing/severity/associated sxs/prior Treatment) Patient is a 73 y.o. male presenting with weakness.  Weakness  patient presents to the emergency department with his daughter who reports he has been tired this week. He's had a decreased appetite. He has had a mild cough that's nonproductive and mild rhinorrhea. He has not had any nausea, vomiting, diarrhea, or sore throat. He denies chest pain. He states he gets mildly short of breath when he goes upstairs. He denies abdominal pain or any urinary symptoms. He denies chills. He denies feeling weak or dizzy when he stands. He has not been around anybody else is ill.   PCP Dr Lawernce Pitts Nephrology Dr. Mercy Moore  Past Medical History  Diagnosis Date  . Stroke   . Hypertension   . Hyperlipidemia   . Aortic stenosis   . CHF (congestive heart failure)   . MGUS (monoclonal gammopathy of unknown significance)   . Goiter   . Rhabdomyolysis   . UTI (urinary tract infection) 05/09/2014   Past Surgical History  Procedure Laterality Date  . Circumcision     No family history on file. History  Substance Use Topics  . Smoking status: Never Smoker   . Smokeless tobacco: Never Used  . Alcohol Use: No  lives at home Lives with spouse  Review of Systems  Neurological: Positive for weakness.  All other systems reviewed and are negative.     Allergies  Review of patient's allergies indicates no known allergies.  Home Medications   Prior to Admission medications   Medication Sig Start Date End Date Taking? Authorizing Provider  amLODipine (NORVASC) 10 MG tablet Take 10 mg by mouth daily.   Yes Historical Provider, MD  aspirin 81 MG tablet Take 81 mg by mouth daily.   Yes Historical Provider, MD  cloNIDine  (CATAPRES - DOSED IN MG/24 HR) 0.3 mg/24hr Place 1 patch onto the skin once a week. Sundays   Yes Historical Provider, MD  furosemide (LASIX) 40 MG tablet Take 0.5 tablets (20 mg total) by mouth 2 (two) times daily. Take 1/2 tab once a day on 5/13 and 5/14.  Then on 5/15 resume 1/2 tab twice a day. 05/09/14  Yes Marianne L York, PA-C  hydrALAZINE (APRESOLINE) 50 MG tablet Take 50-100 mg by mouth 2 (two) times daily. 100 mg in the morning and 50 mg in the evening   Yes Historical Provider, MD  irbesartan (AVAPRO) 300 MG tablet Take 300 mg by mouth daily.   Yes Historical Provider, MD  labetalol (NORMODYNE) 300 MG tablet Take 1 tablet (300 mg total) by mouth 2 (two) times daily. 05/09/14  Yes Marianne L York, PA-C  pravastatin (PRAVACHOL) 20 MG tablet Take 1 tablet (20 mg total) by mouth every evening. 04/30/12  Yes Fay Records, MD  Tamsulosin HCl (FLOMAX) 0.4 MG CAPS Take 0.4 mg by mouth daily.   Yes Historical Provider, MD  ciprofloxacin (CIPRO) 500 MG tablet Take 1 tablet (500 mg total) by mouth 2 (two) times daily. Patient not taking: Reported on 11/16/2014 05/10/14   Melton Alar, PA-C  dextromethorphan-guaiFENesin [facility] DM) 30-600 MG per 12 hr tablet Take 1 tablet by mouth 2 (two) times daily as needed for cough. 11/16/14   Janice Norrie, MD   BP  151/71 mmHg  Pulse 88  Temp(Src) 100.7 F (38.2 C) (Oral)  Resp 26  Ht 5\' 4"  (1.626 m)  Wt 140 lb (63.504 kg)  BMI 24.02 kg/m2  SpO2 96%  Vital signs normal except low-grade fever   Physical Exam  Constitutional: He is oriented to person, place, and time. He appears well-developed and well-nourished.  Non-toxic appearance. He does not appear ill. No distress.  HENT:  Head: Normocephalic and atraumatic.  Right Ear: External ear normal.  Left Ear: External ear normal.  Nose: Nose normal. No mucosal edema or rhinorrhea.  Mouth/Throat: Oropharynx is clear and moist and mucous membranes are normal. No dental abscesses or uvula swelling.  Eyes:  Conjunctivae and EOM are normal. Pupils are equal, round, and reactive to light.  Neck: Normal range of motion and full passive range of motion without pain. Neck supple.  Cardiovascular: Normal rate and regular rhythm.  Exam reveals no gallop and no friction rub.   Murmur heard. Pt has a loud systolic murmer heard best in the LUSB also transmits into his posterior chest. Daughter states present since birth  Pulmonary/Chest: Effort normal and breath sounds normal. No respiratory distress. He has no wheezes. He has no rhonchi. He has no rales. He exhibits no tenderness and no crepitus.  Abdominal: Soft. Normal appearance and bowel sounds are normal. He exhibits no distension. There is no tenderness. There is no rebound and no guarding.  Musculoskeletal: Normal range of motion. He exhibits edema. He exhibits no tenderness.  Patient has bilateral 1+ pitting edema of his lower extremities which they state is chronic  Neurological: He is alert and oriented to person, place, and time. He has normal strength. No cranial nerve deficit.  Patient has a right facial droop from prior stroke  Skin: Skin is warm, dry and intact. No rash noted. No erythema. No pallor.  Psychiatric: He has a normal mood and affect. His speech is normal and behavior is normal. His mood appears not anxious.  Nursing note and vitals reviewed.   ED Course  Procedures (including critical care time)  Patient and his daughter and son-in-law were given his test results. Patient is a nonsmoker, his had no sick contacts. He will be treated with symptomatic care for his URI.  Labs Review Results for orders placed or performed during the hospital encounter of 11/16/14  BNP (order ONLY if patient complains of dyspnea/SOB AND you have documented it for THIS visit)  Result Value Ref Range   Pro B Natriuretic peptide (BNP) 5173.0 (H) 0 - 125 pg/mL  Basic metabolic panel  Result Value Ref Range   Sodium 144 137 - 147 mEq/L   Potassium  3.9 3.7 - 5.3 mEq/L   Chloride 105 96 - 112 mEq/L   CO2 24 19 - 32 mEq/L   Glucose, Bld 124 (H) 70 - 99 mg/dL   BUN 29 (H) 6 - 23 mg/dL   Creatinine, Ser 1.11 0.50 - 1.35 mg/dL   Calcium 10.2 8.4 - 10.5 mg/dL   GFR calc non Af Amer 64 (L) >90 mL/min   GFR calc Af Amer 75 (L) >90 mL/min   Anion gap 15 5 - 15  CBC  Result Value Ref Range   WBC 8.7 4.0 - 10.5 K/uL   RBC 3.75 (L) 4.22 - 5.81 MIL/uL   Hemoglobin 10.3 (L) 13.0 - 17.0 g/dL   HCT 32.0 (L) 39.0 - 52.0 %   MCV 85.3 78.0 - 100.0 fL   MCH 27.5 26.0 -  34.0 pg   MCHC 32.2 30.0 - 36.0 g/dL   RDW 13.6 11.5 - 15.5 %   Platelets 154 150 - 400 K/uL  I-Stat CG4 Lactic Acid, ED  Result Value Ref Range   Lactic Acid, Venous 1.05 0.5 - 2.2 mmol/L     Laboratory interpretation all normal except stable mild anemia, stable elevated BUN   Imaging Review Dg Chest 2 View  11/16/2014   CLINICAL DATA:  General body weakness, shortness of breath  EXAM: CHEST  2 VIEW  COMPARISON:  03/10/2012  FINDINGS: The heart size and mediastinal contours are within normal limits. Both lungs are clear. Rightward deviation of the trachea likely related to a thyroid mass. The visualized skeletal structures are unremarkable.  IMPRESSION: No active cardiopulmonary disease.   Electronically Signed   By: Kathreen Devoid   On: 11/16/2014 20:19   Daughter states they are very aware of this possible thyroid mass which has been evaluated several times.    EKG Interpretation   Date/Time:  Thursday November 16 2014 18:48:46 EST Ventricular Rate:  81 PR Interval:  344 QRS Duration: 86 QT Interval:  368 QTC Calculation: 427 R Axis:   60 Text Interpretation:  Sinus rhythm with 1st degree A-V block Anteroseptal  infarct , age undetermined No significant change since last tracing  earlier today Confirmed by [place]  MD-I, Elani Delph ([id number]) on 11/16/2014 9:27:18  PM      MDM   Final diagnoses:  URI, acute     Discharge Medication List as of 11/16/2014  8:42 PM     START taking these medications   Details  dextromethorphan-guaiFENesin (MUCINEX DM) 30-600 MG per 12 hr tablet Take 1 tablet by mouth 2 (two) times daily as needed for cough., Starting 11/16/2014, Until Discontinued, Print        Plan discharge  [name], MD, Alanson Aly, MD 11/16/14 8850  Janice Norrie, MD 11/16/14 2120  Janice Norrie, MD 11/16/14 2128

## 2014-11-16 NOTE — Discharge Instructions (Signed)
Take the mucinex DM for your cough. You can take acetaminophen 650 mg 4 times a day for fever or body aches. Let your doctor know if you get a high fever, cough up yellow or green mucus, your breathing gets worse or you feel worse in any way.    Upper Respiratory Infection, Adult An upper respiratory infection (URI) is also known as the common cold. It is often caused by a type of germ (virus). Colds are easily spread (contagious). You can pass it to others by kissing, coughing, sneezing, or drinking out of the same glass. Usually, you get better in 1 or 2 weeks.  HOME CARE   Only take medicine as told by your doctor.  Use a warm mist humidifier or breathe in steam from a hot shower.  Drink enough water and fluids to keep your pee (urine) clear or pale yellow.  Get plenty of rest.  Return to work when your temperature is back to normal or as told by your doctor. You may use a face mask and wash your hands to stop your cold from spreading. GET HELP RIGHT AWAY IF:   After the first few days, you feel you are getting worse.  You have questions about your medicine.  You have chills, shortness of breath, or brown or red spit (mucus).  You have yellow or brown snot (nasal discharge) or pain in the face, especially when you bend forward.  You have a fever, puffy (swollen) neck, pain when you swallow, or white spots in the back of your throat.  You have a bad headache, ear pain, sinus pain, or chest pain.  You have a high-pitched whistling sound when you breathe in and out (wheezing).  You have a lasting cough or cough up blood.  You have sore muscles or a stiff neck. MAKE SURE YOU:   Understand these instructions.  Will watch your condition.  Will get help right away if you are not doing well or get worse. Document Released: 06/02/2008 Document Revised: 03/08/2012 Document Reviewed: 03/22/2014 [facility] Patient Information 2015 [place], [place]. This information is not intended to  replace advice given to you by your health care provider. Make sure you discuss any questions you have with your health care provider.  Viral Infections A virus is a type of germ. Viruses can cause:  Minor sore throats.  Aches and pains.  Headaches.  Runny nose.  Rashes.  Watery eyes.  Tiredness.  Coughs.  Loss of appetite.  Feeling sick to your stomach (nausea).  Throwing up (vomiting).  Watery poop (diarrhea). HOME CARE   Only take medicines as told by your doctor.  Drink enough water and fluids to keep your pee (urine) clear or pale yellow. Sports drinks are a good choice.  Get plenty of rest and eat healthy. Soups and broths with crackers or rice are fine. GET HELP RIGHT AWAY IF:   You have a very bad headache.  You have shortness of breath.  You have chest pain or neck pain.  You have an unusual rash.  You cannot stop throwing up.  You have watery poop that does not stop.  You cannot keep fluids down.  You or your child has a temperature by mouth above 102 F (38.9 C), not controlled by medicine.  Your baby is older than 3 months with a rectal temperature of 102 F (38.9 C) or higher.  Your baby is 74 months old or younger with a rectal temperature of 100.4 F (38 C)  or higher. MAKE SURE YOU:   Understand these instructions.  Will watch this condition.  Will get help right away if you are not doing well or get worse. Document Released: 11/27/2008 Document Revised: 03/08/2012 Document Reviewed: 04/22/2011 [facility] Patient Information 2015 [place], [place]. This information is not intended to replace advice given to you by your health care provider. Make sure you discuss any questions you have with your health care provider.

## 2014-11-16 NOTE — ED Notes (Signed)
Pt brought from home by his daughter where he lives by himself.  She states he has not been himself for the last 3 days - not eating, slowing down his walk, dyspnea on exertion etc.  Temp of 100.7 in triage.  Pt denies pain.

## 2014-11-16 NOTE — ED Notes (Signed)
Dr. Tomi Bamberger now at the bedside to see the patient.

## 2014-11-20 ENCOUNTER — Emergency Department (INDEPENDENT_AMBULATORY_CARE_PROVIDER_SITE_OTHER)
Admission: EM | Admit: 2014-11-20 | Discharge: 2014-11-20 | Disposition: A | Discharge status: Home / Self Care | Payer: Medicare Other | Source: Home / Self Care | Attending: Emergency Medicine | Admitting: Emergency Medicine

## 2014-11-20 ENCOUNTER — Encounter (HOSPITAL_COMMUNITY): Payer: Self-pay | Admitting: Emergency Medicine

## 2014-11-20 DIAGNOSIS — N390 Urinary tract infection, site not specified: Secondary | ICD-10-CM

## 2014-11-20 LAB — POCT URINALYSIS DIP (DEVICE)
BILIRUBIN URINE: NEGATIVE
GLUCOSE, UA: NEGATIVE mg/dL
Hgb urine dipstick: NEGATIVE
Ketones, ur: NEGATIVE mg/dL
Nitrite: NEGATIVE
PH: 5 (ref 5.0–8.0)
Protein, ur: NEGATIVE mg/dL
Specific Gravity, Urine: 1.01 (ref 1.005–1.030)
Urobilinogen, UA: 0.2 mg/dL (ref 0.0–1.0)

## 2014-11-20 MED ORDER — CIPROFLOXACIN HCL 500 MG PO TABS: 500.0000 mg | ORAL_TABLET | Freq: Two times a day (BID) | ORAL | Status: DC

## 2014-11-20 NOTE — ED Provider Notes (Addendum)
CSN: [id number]     Arrival date & time 11/20/14  1329 History   First MD Initiated Contact with Patient 11/20/14 1500     Chief Complaint  Patient presents with  . Urinary Tract Infection   (Consider location/radiation/quality/duration/timing/severity/associated sxs/prior Treatment) HPI  He is a 73 year old man here with his daughter for evaluation for bladder infection. History is obtained from the daughter. She states that he has been a little more sluggish the last few days as well as having a decreased appetite. He was seen and evaluated in the ER on Thursday for a temperature of 100.7. At that time he was diagnosed with a URI.  Since then, he has occasionally reported the urge to urinate, but was unable to produce urine. He denies any fevers, chills, nausea, vomiting, flank pain, abdominal pain, dysuria, hematuria.  Past Medical History  Diagnosis Date  . Stroke   . Hypertension   . Hyperlipidemia   . Aortic stenosis   . CHF (congestive heart failure)   . MGUS (monoclonal gammopathy of unknown significance)   . Goiter   . Rhabdomyolysis   . UTI (urinary tract infection) 05/09/2014   Past Surgical History  Procedure Laterality Date  . Circumcision     History reviewed. No pertinent family history. History  Substance Use Topics  . Smoking status: Never Smoker   . Smokeless tobacco: Never Used  . Alcohol Use: No    Review of Systems  Constitutional: Positive for appetite change and fatigue. Negative for fever.  Gastrointestinal: Negative for nausea, vomiting and abdominal pain.  Genitourinary: Positive for urgency and flank pain. Negative for dysuria, hematuria and testicular pain.    Allergies  Review of patient's allergies indicates no known allergies.  Home Medications   Prior to Admission medications   Medication Sig Start Date End Date Taking? Authorizing Provider  cloNIDine (CATAPRES - DOSED IN MG/24 HR) 0.3 mg/24hr Place 1 patch onto the skin once a week.  Sundays   Yes Historical Provider, MD  dextromethorphan-guaiFENesin (MUCINEX DM) 30-600 MG per 12 hr tablet Take 1 tablet by mouth 2 (two) times daily as needed for cough. 11/16/14  Yes Janice Norrie, MD  hydrALAZINE (APRESOLINE) 50 MG tablet Take 50-100 mg by mouth 2 (two) times daily. 100 mg in the morning and 50 mg in the evening   Yes Historical Provider, MD  irbesartan (AVAPRO) 300 MG tablet Take 300 mg by mouth daily.   Yes Historical Provider, MD  labetalol (NORMODYNE) 300 MG tablet Take 1 tablet (300 mg total) by mouth 2 (two) times daily. 05/09/14  Yes [name] York, PA-C  Tamsulosin HCl (FLOMAX) 0.4 MG CAPS Take 0.4 mg by mouth daily.   Yes Historical Provider, MD  amLODipine (NORVASC) 10 MG tablet Take 10 mg by mouth daily.    Historical Provider, MD  aspirin 81 MG tablet Take 81 mg by mouth daily.    Historical Provider, MD  ciprofloxacin (CIPRO) 500 MG tablet Take 1 tablet (500 mg total) by mouth 2 (two) times daily. 11/20/14   [name], MD  furosemide (LASIX) 40 MG tablet Take 0.5 tablets (20 mg total) by mouth 2 (two) times daily. Take 1/2 tab once a day on 5/13 and 5/14.  Then on 5/15 resume 1/2 tab twice a day. 05/09/14   [name] York, PA-C  pravastatin (PRAVACHOL) 20 MG tablet Take 1 tablet (20 mg total) by mouth every evening. 04/30/12   Fay Records, MD   BP 175/98 mmHg  Pulse 66  Temp(Src) 97.9 F (36.6 C) (Oral)  Resp 16  SpO2 98% Physical Exam  Constitutional: He appears well-developed and well-nourished. No distress.  Abdominal: Soft. Bowel sounds are normal. He exhibits no distension. There is no tenderness. There is no rebound and no guarding.  No CVA tenderness  Neurological: He is alert.  Speech slurred, but at baseline    ED Course  Procedures (including critical care time) Labs Review Labs Reviewed  POCT URINALYSIS DIP (DEVICE) - Abnormal; Notable for the following:    Leukocytes, UA MODERATE (*)    All other components within normal limits  URINE  CULTURE    Imaging Review No results found.   MDM   1. UTI (lower urinary tract infection)    UA mildly concerning for bladder infection. We'll send urine for culture. Labs drawn 4 days ago reviewed. Creatinine is at his baseline. We'll go ahead and treat with Cipro for 7 days. Reviewed reasons to go to the emergency room as in after visit summary.    [name], MD 11/20/14 [place], MD 11/20/14 1534

## 2014-11-20 NOTE — Discharge Instructions (Signed)
Your urine is concerning for infection. Take Cipro 1 pill twice a day for 7 days. If you develop fevers, vomiting, confusion, please go to the emergency room.

## 2014-11-22 LAB — URINE CULTURE: Colony Count: 40000

## 2014-11-29 ENCOUNTER — Encounter (HOSPITAL_COMMUNITY): Payer: Self-pay | Admitting: *Deleted

## 2014-11-29 ENCOUNTER — Emergency Department (INDEPENDENT_AMBULATORY_CARE_PROVIDER_SITE_OTHER)
Admission: EM | Admit: 2014-11-29 | Discharge: 2014-11-29 | Disposition: A | Discharge status: Home / Self Care | Payer: Medicare Other | Source: Home / Self Care | Attending: Family Medicine | Admitting: Family Medicine

## 2014-11-29 DIAGNOSIS — R600 Localized edema: Secondary | ICD-10-CM

## 2014-11-29 NOTE — ED Notes (Signed)
Error wrong chart. Disregard d/c condition and VS @ 1828 and 1829.

## 2014-11-29 NOTE — ED Provider Notes (Signed)
CSN: [id number]     Arrival date & time 11/29/14  1750 History   First MD Initiated Contact with Patient 11/29/14 1804     Chief Complaint  Patient presents with  . Leg Swelling  . Fall   (Consider location/radiation/quality/duration/timing/severity/associated sxs/prior Treatment) HPI            73 year old male with history of multiple strokes, MGUS, CHF, aortic stenosis, presents for evaluation of leg swelling. He has had bilateral leg swelling for a long time. His doctor has him on Lasix which was increased one month ago. This seemed to help slightly but now the swelling is getting worse. Last night his leg seemed to get week for second and he fell. He describes this more as sliding to the ground while holding onto the couch. He did not hit his head and did not lose consciousness. He has been conscious ever since then without any headache or any pain from the fall. They're mainly here for evaluation of leg swelling, his daughter is worried about him. He has a follow-up with his primary care doctor in 2 weeks. He had blood work done 2 weeks ago, no renal dysfunction. He denies any chest pain or shortness of breath. He is urinating normally. Denies any pain or redness in his legs  Past Medical History  Diagnosis Date  . Stroke   . Hypertension   . Hyperlipidemia   . Aortic stenosis   . CHF (congestive heart failure)   . MGUS (monoclonal gammopathy of unknown significance)   . Goiter   . Rhabdomyolysis   . UTI (urinary tract infection) 05/09/2014   Past Surgical History  Procedure Laterality Date  . Circumcision     History reviewed. No pertinent family history. History  Substance Use Topics  . Smoking status: Never Smoker   . Smokeless tobacco: Never Used  . Alcohol Use: No    Review of Systems  Cardiovascular: Positive for leg swelling (see history of present illness).  All other systems reviewed and are negative.   Allergies  Review of patient's allergies indicates no known  allergies.  Home Medications   Prior to Admission medications   Medication Sig Start Date End Date Taking? Authorizing Provider  amLODipine (NORVASC) 10 MG tablet Take 10 mg by mouth daily.   Yes Historical Provider, MD  aspirin 81 MG tablet Take 81 mg by mouth daily.   Yes Historical Provider, MD  cloNIDine (CATAPRES - DOSED IN MG/24 HR) 0.3 mg/24hr Place 1 patch onto the skin once a week. Sundays   Yes Historical Provider, MD  furosemide (LASIX) 40 MG tablet Take 0.5 tablets (20 mg total) by mouth 2 (two) times daily. Take 1/2 tab once a day on 5/13 and 5/14.  Then on 5/15 resume 1/2 tab twice a day. 05/09/14  Yes Marianne L York, PA-C  hydrALAZINE (APRESOLINE) 50 MG tablet Take 50-100 mg by mouth 2 (two) times daily. 100 mg in the morning and 50 mg in the evening   Yes Historical Provider, MD  irbesartan (AVAPRO) 300 MG tablet Take 300 mg by mouth daily.   Yes Historical Provider, MD  labetalol (NORMODYNE) 300 MG tablet Take 1 tablet (300 mg total) by mouth 2 (two) times daily. 05/09/14  Yes Marianne L York, PA-C  pravastatin (PRAVACHOL) 20 MG tablet Take 1 tablet (20 mg total) by mouth every evening. 04/30/12  Yes Fay Records, MD  Tamsulosin HCl (FLOMAX) 0.4 MG CAPS Take 0.4 mg by mouth daily.   Yes  Historical Provider, MD  ciprofloxacin (CIPRO) 500 MG tablet Take 1 tablet (500 mg total) by mouth 2 (two) times daily. 11/20/14   [name], MD  dextromethorphan-guaiFENesin (MUCINEX DM) 30-600 MG per 12 hr tablet Take 1 tablet by mouth 2 (two) times daily as needed for cough. 11/16/14   Janice Norrie, MD   BP 122/55 mmHg  Pulse 71  Temp(Src) 100.3 F (37.9 C) (Oral)  Resp 24  SpO2 100% Physical Exam  Constitutional: He is oriented to person, place, and time. He appears well-developed and well-nourished. No distress.  HENT:  Head: Normocephalic.  Eyes: Conjunctivae and EOM are normal. Pupils are equal, round, and reactive to light.  Cardiovascular:  3+ pitting edema up to the knees,  bilateral, nontender  Pulmonary/Chest: Effort normal. No respiratory distress.  Neurological: He is alert and oriented to person, place, and time. A cranial nerve deficit is present. No sensory deficit. He exhibits normal muscle tone. Coordination and gait (shuffling gait. Uses a walker for ambulation.) abnormal.  Cranial nerves II through XII are intact. He has chronic residual deficits from his strokes that is unchanged  Skin: Skin is warm and dry. No rash noted. He is not diaphoretic.  Psychiatric: He has a normal mood and affect. Judgment normal.  Nursing note and vitals reviewed.   ED Course  Procedures (including critical care time) Labs Review Labs Reviewed - No data to display  Imaging Review No results found.   MDM   1. Bilateral lower extremity edema    Patient is neurologically unchanged from his baseline. There are no red flags and no signs of intracranial injury or other serious injury from the fall. He is able to bear full weight and his gait is unchanged. He had blood work done 2 weeks ago, creatinine was actually going down from his previous chemistries, creatinine was 1.1. I feel it is safe for this patient to increase his Lasix to 80 mg in the morning and 40 mg at night instead of just just 40 mg twice daily. He will elevate the legs and use compression stockings. Will follow up with his primary care doctor in 1 week.      Liam Graham, PA-C 11/29/14 [phone]

## 2014-11-29 NOTE — Discharge Instructions (Signed)
Increase furosemide to 2 tablets in the morning and 1 tablet at night and follow-up with your primary care physician in one week.  Peripheral Edema You have swelling in your legs (peripheral edema). This swelling is due to excess accumulation of salt and water in your body. Edema may be a sign of heart, kidney or liver disease, or a side effect of a medication. It may also be due to problems in the leg veins. Elevating your legs and using special support stockings may be very helpful, if the cause of the swelling is due to poor venous circulation. Avoid long periods of standing, whatever the cause. Treatment of edema depends on identifying the cause. Chips, pretzels, pickles and other salty foods should be avoided. Restricting salt in your diet is almost always needed. Water pills (diuretics) are often used to remove the excess salt and water from your body via urine. These medicines prevent the kidney from reabsorbing sodium. This increases urine flow. Diuretic treatment may also result in lowering of potassium levels in your body. Potassium supplements may be needed if you have to use diuretics daily. Daily weights can help you keep track of your progress in clearing your edema. You should call your caregiver for follow up care as recommended. SEEK IMMEDIATE MEDICAL CARE IF:   You have increased swelling, pain, redness, or heat in your legs.  You develop shortness of breath, especially when lying down.  You develop chest or abdominal pain, weakness, or fainting.  You have a fever. Document Released: 01/22/2005 Document Revised: 03/08/2012 Document Reviewed: 01/02/2010 [facility] Patient Information 2015 [place], [place]. This information is not intended to replace advice given to you by your health care provider. Make sure you discuss any questions you have with your health care provider.

## 2014-11-29 NOTE — ED Notes (Signed)
Fell yesterday because his R leg gave out on him.  Both legs are swollen for 1 month.  Dr. Mercy Moore increased his Furosemide 1 month ago.  Legs went down and now they are up and down.  Denies pain.  Walks with a walker in very small steps.  Daughter states this is normal gait since his multiple strokes.

## 2014-12-07 ENCOUNTER — Telehealth: Payer: Self-pay | Admitting: General Practice

## 2014-12-07 NOTE — Telephone Encounter (Signed)
New Message       Bradley [name] want to know the Month of his diagnosis of  Aortic Stenosis

## 2014-12-11 ENCOUNTER — Encounter: Payer: Self-pay | Admitting: Internal Medicine

## 2014-12-11 ENCOUNTER — Ambulatory Visit (INDEPENDENT_AMBULATORY_CARE_PROVIDER_SITE_OTHER): Payer: Medicare Other | Admitting: Internal Medicine

## 2014-12-11 VITALS — BP 158/110 | HR 68 | Ht 64.0 in | Wt 147.1 lb

## 2014-12-11 DIAGNOSIS — R0602 Shortness of breath: Secondary | ICD-10-CM | POA: Diagnosis not present

## 2014-12-11 DIAGNOSIS — I639 Cerebral infarction, unspecified: Secondary | ICD-10-CM

## 2014-12-11 DIAGNOSIS — I350 Nonrheumatic aortic (valve) stenosis: Secondary | ICD-10-CM | POA: Diagnosis not present

## 2014-12-11 LAB — BASIC METABOLIC PANEL
BUN: 25 mg/dL — ABNORMAL HIGH (ref 6–23)
CALCIUM: 9.4 mg/dL (ref 8.4–10.5)
CO2: 26 meq/L (ref 19–32)
Chloride: 102 mEq/L (ref 96–112)
Creatinine, Ser: 1.2 mg/dL (ref 0.4–1.5)
GFR: 77.08 mL/min (ref >60.00)
GLUCOSE: 104 mg/dL — AB (ref 70–99)
Potassium: 4.2 mEq/L (ref 3.5–5.1)
Sodium: 134 mEq/L — ABNORMAL LOW (ref 135–145)

## 2014-12-11 LAB — BRAIN NATRIURETIC PEPTIDE: Pro B Natriuretic peptide (BNP): 1169 pg/mL — ABNORMAL HIGH (ref 0.0–100.0)

## 2014-12-11 NOTE — Progress Notes (Signed)
HPI Patient is a 73 year old with a history of HTN, moderate AS, CVA )(most recent 02/2012). Also has a history of rhabdomyolysis Last  showed LVEF was 60 to 65% Mean gradient through the valve was 43 mm Hg consistent with severe AS> Also in the nospital had asymptomatic NSVT. Myoview in 2012 was normal.  He was in clinic earlier this fall. Breathing is stable  No PND  No CP  No dizziness, syncope  No palpitations. Seen in renal clinic recently   No Known Allergies  Current Outpatient Prescriptions  Medication Sig Dispense Refill  . amLODipine (NORVASC) 10 MG tablet Take 10 mg by mouth daily.    Marland Kitchen aspirin 81 MG tablet Take 81 mg by mouth daily.    . cloNIDine (CATAPRES - DOSED IN MG/24 HR) 0.3 mg/24hr Place 1 patch onto the skin once a week. Sundays    . furosemide (LASIX) 40 MG tablet Take 0.5 tablets (20 mg total) by mouth 2 (two) times daily. Take 1/2 tab once a day on 5/13 and 5/14.  Then on 5/15 resume 1/2 tab twice a day. (Patient taking differently: Take 40 mg by mouth daily. Take two 40 mg tablets in the morning and one 40 mg tablet in the afternoon) 30 tablet 0  . hydrALAZINE (APRESOLINE) 50 MG tablet Take 50-100 mg by mouth 2 (two) times daily. 100 mg in the morning and 50 mg in the evening    . irbesartan (AVAPRO) 300 MG tablet Take 300 mg by mouth daily.    Marland Kitchen labetalol (NORMODYNE) 300 MG tablet Take 1 tablet (300 mg total) by mouth 2 (two) times daily.    . pravastatin (PRAVACHOL) 20 MG tablet Take 1 tablet (20 mg total) by mouth every evening. 30 tablet 11  . Tamsulosin HCl (FLOMAX) 0.4 MG CAPS Take 0.4 mg by mouth daily.    . ciprofloxacin (CIPRO) 500 MG tablet Take 1 tablet (500 mg total) by mouth 2 (two) times daily. (Patient not taking: Reported on 12/11/2014) 14 tablet 0  . dextromethorphan-guaiFENesin (MUCINEX DM) 30-600 MG per 12 hr tablet Take 1 tablet by mouth 2 (two) times daily as needed for cough. (Patient not taking: Reported on 12/11/2014) 20 tablet 0   No current  facility-administered medications for this visit.    Past Medical History  Diagnosis Date  . Stroke   . Hypertension   . Hyperlipidemia   . Aortic stenosis   . CHF (congestive heart failure)   . MGUS (monoclonal gammopathy of unknown significance)   . Goiter   . Rhabdomyolysis   . UTI (urinary tract infection) 05/09/2014    Past Surgical History  Procedure Laterality Date  . Circumcision      No family history on file.  History   Social History  . Marital Status: Legally Separated    Spouse Name: N/A    Number of Children: N/A  . Years of Education: N/A   Occupational History  . Not on file.   Social History Main Topics  . Smoking status: Never Smoker   . Smokeless tobacco: Never Used  . Alcohol Use: No  . Drug Use: No  . Sexual Activity: Not on file   Other Topics Concern  . Not on file   Social History Narrative    Review of Systems:  All systems reviewed.  They are negative to the above problem except as previously stated.  Vital Signs: BP 158/110 mmHg  Pulse 68  Ht 5\' 4"  (1.626 m)  Wt 147 lb 1.9 oz (66.733 kg)  BMI 25.24 kg/m2  Physical Exam Patient is in NAD HEENT:  Normocephalic, atraumatic. EOMI, PERRLA.  Neck: JVP is normal. Large mass on left neck  Nontender.   Lungs: clear to auscultation. No rales no wheezes.  Heart: Regular rate and rhythm. Normal S1, S2. No S3.  Gr III/VI systolic murmur at basePMI not displaced.  Abdomen:  Supple, nontender. Normal bowel sounds. No masses. No hepatomegaly.  Extremities:   Good distal pulses throughout. tr lower extremity edema.  Musculoskeletal :moving all extremities.  Neuro:   alert and oriented x3.  CN II-XII grossly intact.  EKG  SR 71  First degree AV block  PR interval 366 msec  Anteroseptal MI  T wave inversion V5, V6, I Assessment and Plan:  1.  Aortic stenosis.    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  He denies symptoms though he is not active.  Again reviewed with daughter and patient .    2.  Neck mass.  3  HTN BP is high  WIll get outside notes  I told him he should take avapro in am   4.  AV block  Follow  5.  HL  Continue pravastatin.

## 2014-12-11 NOTE — Patient Instructions (Signed)
Your physician wants you to follow-up in: May with Dr. Theressa Stamps will receive a reminder letter in the mail two months in advance. If you don't receive a letter, please call our office to schedule the follow-up appointment.  Your physician recommends that you return for lab work today: BMP/BNP

## 2014-12-13 ENCOUNTER — Telehealth: Payer: Self-pay | Admitting: *Deleted

## 2014-12-13 DIAGNOSIS — I350 Nonrheumatic aortic (valve) stenosis: Secondary | ICD-10-CM

## 2014-12-13 NOTE — Telephone Encounter (Signed)
Spoke with patient's daughter. Informed someone will be calling to schedule echo.

## 2014-12-13 NOTE — Telephone Encounter (Signed)
-----   Message from Dorris Carnes V, MD sent at 12/12/2014  3:26 PM EST ----- Electrolytes OK Fluid is increased some   Better than 1 month ago Iwould not make med changes now Note that patient should have repeat echo to evaluate AV an heart pumping function.  Las one was 2 years ago.

## 2014-12-14 ENCOUNTER — Telehealth: Payer: Self-pay | Admitting: Internal Medicine

## 2014-12-14 NOTE — Telephone Encounter (Signed)
New Msg    Pt daughter calling, states she received phn call to schedule pt for an echo but he had one completed in November at the hospital.    Does he need another or can those results be used?   [name], daughter [phone].

## 2014-12-14 NOTE — Telephone Encounter (Signed)
Returned call to patient's daughter Bradley [name] she stated father had echo in Nov at [facility] after reviewing chart he had a EKG not a Echo.Stated she wanted schedulers to call her to schedule.Advised will let them know to call you at # [phone].

## 2014-12-18 ENCOUNTER — Ambulatory Visit (HOSPITAL_COMMUNITY): Payer: Medicare Other | Attending: Cardiovascular Disease | Admitting: Cardiology

## 2014-12-18 DIAGNOSIS — R0600 Dyspnea, unspecified: Secondary | ICD-10-CM | POA: Insufficient documentation

## 2014-12-18 DIAGNOSIS — I10 Essential (primary) hypertension: Secondary | ICD-10-CM | POA: Diagnosis not present

## 2014-12-18 DIAGNOSIS — I350 Nonrheumatic aortic (valve) stenosis: Secondary | ICD-10-CM

## 2014-12-18 DIAGNOSIS — Z87891 Personal history of nicotine dependence: Secondary | ICD-10-CM | POA: Insufficient documentation

## 2014-12-18 DIAGNOSIS — I352 Nonrheumatic aortic (valve) stenosis with insufficiency: Secondary | ICD-10-CM | POA: Insufficient documentation

## 2014-12-18 DIAGNOSIS — I509 Heart failure, unspecified: Secondary | ICD-10-CM | POA: Insufficient documentation

## 2014-12-18 DIAGNOSIS — Z8673 Personal history of transient ischemic attack (TIA), and cerebral infarction without residual deficits: Secondary | ICD-10-CM | POA: Insufficient documentation

## 2014-12-18 DIAGNOSIS — E785 Hyperlipidemia, unspecified: Secondary | ICD-10-CM | POA: Insufficient documentation

## 2014-12-18 NOTE — Progress Notes (Signed)
Echo performed. 

## 2014-12-25 ENCOUNTER — Telehealth: Payer: Self-pay | Admitting: Internal Medicine

## 2014-12-25 NOTE — Telephone Encounter (Signed)
New message    Calling for echo test results.

## 2014-12-25 NOTE — Telephone Encounter (Signed)
Spoke with Maudie Mercury in [employer]. No DPR signed and scanned. Left message on [name]'s private VM to have the patient complete paperwork so this is not an issue in the future.

## 2015-01-19 ENCOUNTER — Ambulatory Visit: Payer: Medicare Other | Admitting: Endocrinology

## 2015-03-31 ENCOUNTER — Encounter (HOSPITAL_COMMUNITY): Payer: Self-pay | Admitting: *Deleted

## 2015-03-31 ENCOUNTER — Inpatient Hospital Stay (HOSPITAL_COMMUNITY)
Admission: EM | Admit: 2015-03-31 | Discharge: 2015-04-05 | DRG: 689 | Disposition: A | Discharge status: Skilled Nursing Facility | Payer: Medicare Other | Attending: Internal Medicine | Admitting: Internal Medicine

## 2015-03-31 ENCOUNTER — Emergency Department (HOSPITAL_COMMUNITY): Payer: Medicare Other

## 2015-03-31 DIAGNOSIS — R7989 Other specified abnormal findings of blood chemistry: Secondary | ICD-10-CM | POA: Diagnosis present

## 2015-03-31 DIAGNOSIS — I129 Hypertensive chronic kidney disease with stage 1 through stage 4 chronic kidney disease, or unspecified chronic kidney disease: Secondary | ICD-10-CM | POA: Diagnosis present

## 2015-03-31 DIAGNOSIS — J690 Pneumonitis due to inhalation of food and vomit: Secondary | ICD-10-CM | POA: Diagnosis not present

## 2015-03-31 DIAGNOSIS — I2510 Atherosclerotic heart disease of native coronary artery without angina pectoris: Secondary | ICD-10-CM | POA: Diagnosis present

## 2015-03-31 DIAGNOSIS — I248 Other forms of acute ischemic heart disease: Secondary | ICD-10-CM | POA: Diagnosis not present

## 2015-03-31 DIAGNOSIS — E785 Hyperlipidemia, unspecified: Secondary | ICD-10-CM | POA: Diagnosis present

## 2015-03-31 DIAGNOSIS — Z7982 Long term (current) use of aspirin: Secondary | ICD-10-CM

## 2015-03-31 DIAGNOSIS — R748 Abnormal levels of other serum enzymes: Secondary | ICD-10-CM

## 2015-03-31 DIAGNOSIS — R4701 Aphasia: Secondary | ICD-10-CM | POA: Diagnosis present

## 2015-03-31 DIAGNOSIS — N189 Chronic kidney disease, unspecified: Secondary | ICD-10-CM | POA: Diagnosis present

## 2015-03-31 DIAGNOSIS — R531 Weakness: Secondary | ICD-10-CM | POA: Diagnosis not present

## 2015-03-31 DIAGNOSIS — N183 Chronic kidney disease, stage 3 unspecified: Secondary | ICD-10-CM | POA: Diagnosis present

## 2015-03-31 DIAGNOSIS — I5033 Acute on chronic diastolic (congestive) heart failure: Secondary | ICD-10-CM | POA: Diagnosis present

## 2015-03-31 DIAGNOSIS — I5031 Acute diastolic (congestive) heart failure: Secondary | ICD-10-CM | POA: Diagnosis not present

## 2015-03-31 DIAGNOSIS — N390 Urinary tract infection, site not specified: Secondary | ICD-10-CM | POA: Diagnosis present

## 2015-03-31 DIAGNOSIS — J189 Pneumonia, unspecified organism: Secondary | ICD-10-CM

## 2015-03-31 DIAGNOSIS — J9601 Acute respiratory failure with hypoxia: Secondary | ICD-10-CM | POA: Diagnosis not present

## 2015-03-31 DIAGNOSIS — D649 Anemia, unspecified: Secondary | ICD-10-CM | POA: Diagnosis present

## 2015-03-31 DIAGNOSIS — R946 Abnormal results of thyroid function studies: Secondary | ICD-10-CM | POA: Diagnosis present

## 2015-03-31 DIAGNOSIS — R471 Dysarthria and anarthria: Secondary | ICD-10-CM | POA: Diagnosis present

## 2015-03-31 DIAGNOSIS — R1310 Dysphagia, unspecified: Secondary | ICD-10-CM | POA: Diagnosis present

## 2015-03-31 DIAGNOSIS — I69359 Hemiplegia and hemiparesis following cerebral infarction affecting unspecified side: Secondary | ICD-10-CM | POA: Diagnosis not present

## 2015-03-31 DIAGNOSIS — N179 Acute kidney failure, unspecified: Secondary | ICD-10-CM | POA: Diagnosis not present

## 2015-03-31 DIAGNOSIS — G9341 Metabolic encephalopathy: Secondary | ICD-10-CM | POA: Diagnosis present

## 2015-03-31 DIAGNOSIS — N3000 Acute cystitis without hematuria: Secondary | ICD-10-CM

## 2015-03-31 DIAGNOSIS — I519 Heart disease, unspecified: Secondary | ICD-10-CM | POA: Diagnosis present

## 2015-03-31 DIAGNOSIS — Z8673 Personal history of transient ischemic attack (TIA), and cerebral infarction without residual deficits: Secondary | ICD-10-CM

## 2015-03-31 DIAGNOSIS — I10 Essential (primary) hypertension: Secondary | ICD-10-CM | POA: Diagnosis not present

## 2015-03-31 DIAGNOSIS — I2489 Other forms of acute ischemic heart disease: Secondary | ICD-10-CM | POA: Diagnosis present

## 2015-03-31 DIAGNOSIS — F0390 Unspecified dementia without behavioral disturbance: Secondary | ICD-10-CM | POA: Diagnosis present

## 2015-03-31 DIAGNOSIS — B961 Klebsiella pneumoniae [K. pneumoniae] as the cause of diseases classified elsewhere: Secondary | ICD-10-CM | POA: Diagnosis present

## 2015-03-31 DIAGNOSIS — R778 Other specified abnormalities of plasma proteins: Secondary | ICD-10-CM | POA: Insufficient documentation

## 2015-03-31 DIAGNOSIS — I509 Heart failure, unspecified: Secondary | ICD-10-CM

## 2015-03-31 DIAGNOSIS — I350 Nonrheumatic aortic (valve) stenosis: Secondary | ICD-10-CM | POA: Diagnosis present

## 2015-03-31 DIAGNOSIS — N289 Disorder of kidney and ureter, unspecified: Secondary | ICD-10-CM

## 2015-03-31 DIAGNOSIS — D472 Monoclonal gammopathy: Secondary | ICD-10-CM | POA: Diagnosis present

## 2015-03-31 DIAGNOSIS — R5383 Other fatigue: Secondary | ICD-10-CM

## 2015-03-31 DIAGNOSIS — G9340 Encephalopathy, unspecified: Secondary | ICD-10-CM | POA: Diagnosis not present

## 2015-03-31 LAB — CBC WITH DIFFERENTIAL/PLATELET
BASOS ABS: 0 10*3/uL (ref 0.0–0.1)
BASOS PCT: 0 % (ref 0–1)
Eosinophils Absolute: 0 10*3/uL (ref 0.0–0.7)
Eosinophils Relative: 0 % (ref 0–5)
HCT: 31.4 % — ABNORMAL LOW (ref 39.0–52.0)
Hemoglobin: 10.1 g/dL — ABNORMAL LOW (ref 13.0–17.0)
Lymphocytes Relative: 11 % — ABNORMAL LOW (ref 12–46)
Lymphs Abs: 1 10*3/uL (ref 0.7–4.0)
MCH: 27.2 pg (ref 26.0–34.0)
MCHC: 32.2 g/dL (ref 30.0–36.0)
MCV: 84.6 fL (ref 78.0–100.0)
MONO ABS: 0.7 10*3/uL (ref 0.1–1.0)
MONOS PCT: 8 % (ref 3–12)
NEUTROS PCT: 81 % — AB (ref 43–77)
Neutro Abs: 7.2 10*3/uL (ref 1.7–7.7)
Platelets: 152 10*3/uL (ref 150–400)
RBC: 3.71 MIL/uL — AB (ref 4.22–5.81)
RDW: 13.5 % (ref 11.5–15.5)
WBC: 8.9 10*3/uL (ref 4.0–10.5)

## 2015-03-31 LAB — URINALYSIS, ROUTINE W REFLEX MICROSCOPIC
Bilirubin Urine: NEGATIVE
Glucose, UA: NEGATIVE mg/dL
HGB URINE DIPSTICK: NEGATIVE
Ketones, ur: NEGATIVE mg/dL
Nitrite: NEGATIVE
PH: 6.5 (ref 5.0–8.0)
Protein, ur: NEGATIVE mg/dL
Specific Gravity, Urine: 1.011 (ref 1.005–1.030)
Urobilinogen, UA: 0.2 mg/dL (ref 0.0–1.0)

## 2015-03-31 LAB — BASIC METABOLIC PANEL
ANION GAP: 15 (ref 5–15)
BUN: 36 mg/dL — ABNORMAL HIGH (ref 6–23)
CO2: 20 mmol/L (ref 19–32)
Calcium: 9.8 mg/dL (ref 8.4–10.5)
Chloride: 102 mmol/L (ref 96–112)
Creatinine, Ser: 1.45 mg/dL — ABNORMAL HIGH (ref 0.50–1.35)
GFR calc Af Amer: 54 mL/min — ABNORMAL LOW (ref >90)
GFR calc non Af Amer: 46 mL/min — ABNORMAL LOW (ref >90)
GLUCOSE: 104 mg/dL — AB (ref 70–99)
Potassium: 3.6 mmol/L (ref 3.5–5.1)
Sodium: 137 mmol/L (ref 135–145)

## 2015-03-31 LAB — URINE MICROSCOPIC-ADD ON

## 2015-03-31 LAB — TROPONIN I: Troponin I: 0.08 ng/mL — ABNORMAL HIGH (ref <0.031)

## 2015-03-31 MED ORDER — TAMSULOSIN HCL 0.4 MG PO CAPS
0.4000 mg | ORAL_CAPSULE | Freq: Two times a day (BID) | ORAL | Status: DC
  Administered 2015-03-31 – 2015-04-05 (×11): 0.4 mg via ORAL
  Filled 2015-03-31 (×11): qty 1

## 2015-03-31 MED ORDER — LEVOFLOXACIN IN D5W 500 MG/100ML IV SOLN
500.0000 mg | INTRAVENOUS | Status: DC
  Filled 2015-03-31: qty 100

## 2015-03-31 MED ORDER — CEPHALEXIN 500 MG PO CAPS: 500.0000 mg | ORAL_CAPSULE | Freq: Three times a day (TID) | ORAL | Status: DC

## 2015-03-31 MED ORDER — CEPHALEXIN 250 MG PO CAPS
500.0000 mg | ORAL_CAPSULE | Freq: Once | ORAL | Status: DC
  Filled 2015-03-31: qty 2

## 2015-03-31 MED ORDER — ASPIRIN EC 81 MG PO TBEC
81.0000 mg | DELAYED_RELEASE_TABLET | Freq: Every day | ORAL | Status: DC
  Administered 2015-04-01 – 2015-04-05 (×5): 81 mg via ORAL
  Filled 2015-03-31 (×7): qty 1

## 2015-03-31 MED ORDER — CLONIDINE HCL 0.3 MG/24HR TD PTWK
0.3000 mg | MEDICATED_PATCH | TRANSDERMAL | Status: DC
  Filled 2015-03-31: qty 1

## 2015-03-31 MED ORDER — HYDRALAZINE HCL 50 MG PO TABS
50.0000 mg | ORAL_TABLET | Freq: Every day | ORAL | Status: DC
  Administered 2015-04-01 – 2015-04-04 (×3): 50 mg via ORAL
  Filled 2015-03-31 (×6): qty 1

## 2015-03-31 MED ORDER — DEXTROSE 5 % IV SOLN
1.0000 g | Freq: Once | INTRAVENOUS | Status: AC
  Administered 2015-03-31: 1 g via INTRAVENOUS
  Filled 2015-03-31: qty 10

## 2015-03-31 MED ORDER — CLONIDINE HCL 0.3 MG/24HR TD PTWK
0.3000 mg | MEDICATED_PATCH | TRANSDERMAL | Status: DC
  Administered 2015-04-01: 0.3 mg via TRANSDERMAL
  Filled 2015-03-31: qty 1

## 2015-03-31 MED ORDER — AMLODIPINE BESYLATE 10 MG PO TABS
10.0000 mg | ORAL_TABLET | Freq: Every day | ORAL | Status: DC
  Administered 2015-04-01 – 2015-04-05 (×4): 10 mg via ORAL
  Filled 2015-03-31 (×6): qty 1

## 2015-03-31 MED ORDER — LABETALOL HCL 300 MG PO TABS
300.0000 mg | ORAL_TABLET | Freq: Two times a day (BID) | ORAL | Status: DC
  Administered 2015-03-31 – 2015-04-05 (×9): 300 mg via ORAL
  Filled 2015-03-31 (×11): qty 1

## 2015-03-31 MED ORDER — LEVOFLOXACIN IN D5W 750 MG/150ML IV SOLN
750.0000 mg | Freq: Once | INTRAVENOUS | Status: AC
  Administered 2015-03-31: 750 mg via INTRAVENOUS
  Filled 2015-03-31: qty 150

## 2015-03-31 MED ORDER — SODIUM CHLORIDE 0.9 % IJ SOLN
3.0000 mL | Freq: Two times a day (BID) | INTRAMUSCULAR | Status: DC
  Administered 2015-03-31 – 2015-04-05 (×5): 3 mL via INTRAVENOUS

## 2015-03-31 MED ORDER — SODIUM CHLORIDE 0.9 % IV SOLN
INTRAVENOUS | Status: DC
  Administered 2015-03-31: 21:00:00 via INTRAVENOUS

## 2015-03-31 MED ORDER — PRAVASTATIN SODIUM 40 MG PO TABS
40.0000 mg | ORAL_TABLET | Freq: Every day | ORAL | Status: DC
  Administered 2015-04-01 – 2015-04-05 (×5): 40 mg via ORAL
  Filled 2015-03-31 (×6): qty 1

## 2015-03-31 MED ORDER — SODIUM CHLORIDE 0.9 % IV BOLUS (SEPSIS)
500.0000 mL | Freq: Once | INTRAVENOUS | Status: AC
  Administered 2015-03-31: 500 mL via INTRAVENOUS

## 2015-03-31 MED ORDER — ENOXAPARIN SODIUM 30 MG/0.3ML ~~LOC~~ SOLN
30.0000 mg | SUBCUTANEOUS | Status: DC
  Filled 2015-03-31 (×2): qty 0.3

## 2015-03-31 MED ORDER — HYDRALAZINE HCL 50 MG PO TABS
100.0000 mg | ORAL_TABLET | Freq: Every day | ORAL | Status: DC
  Administered 2015-03-31 – 2015-04-05 (×6): 100 mg via ORAL
  Filled 2015-03-31 (×6): qty 2

## 2015-03-31 NOTE — Progress Notes (Signed)
03/31/15 Patient to be admitted to Rm 5W39 DX of UTI, tele to be placed, IV site 4/2 RFA,DVT Lovenox,

## 2015-03-31 NOTE — ED Notes (Signed)
Family reports pt being "sluggish" today, not acting his normal. Hx of stroke, seems to have increase in right leg weakness today. Pt denies any pain. No acute distress noted at triage.

## 2015-03-31 NOTE — Discharge Instructions (Signed)
Urinary Tract Infection Urinary tract infections (UTIs) can develop anywhere along your urinary tract. Your urinary tract is your body's drainage system for removing wastes and extra water. Your urinary tract includes two kidneys, two ureters, a bladder, and a urethra. Your kidneys are a pair of bean-shaped organs. Each kidney is about the size of your fist. They are located below your ribs, one on each side of your spine. CAUSES Infections are caused by microbes, which are microscopic organisms, including fungi, viruses, and bacteria. These organisms are so small that they can only be seen through a microscope. Bacteria are the microbes that most commonly cause UTIs. SYMPTOMS  Symptoms of UTIs may vary by age and gender of the patient and by the location of the infection. Symptoms in young women typically include a frequent and intense urge to urinate and a painful, burning feeling in the bladder or urethra during urination. Older women and men are more likely to be tired, shaky, and weak and have muscle aches and abdominal pain. A fever may mean the infection is in your kidneys. Other symptoms of a kidney infection include pain in your back or sides below the ribs, nausea, and vomiting. DIAGNOSIS To diagnose a UTI, your caregiver will ask you about your symptoms. Your caregiver also will ask to provide a urine sample. The urine sample will be tested for bacteria and white blood cells. White blood cells are made by your body to help fight infection. TREATMENT  Typically, UTIs can be treated with medication. Because most UTIs are caused by a bacterial infection, they usually can be treated with the use of antibiotics. The choice of antibiotic and length of treatment depend on your symptoms and the type of bacteria causing your infection. HOME CARE INSTRUCTIONS  If you were prescribed antibiotics, take them exactly as your caregiver instructs you. Finish the medication even if you feel better after you  have only taken some of the medication.  Drink enough water and fluids to keep your urine clear or pale yellow.  Avoid caffeine, tea, and carbonated beverages. They tend to irritate your bladder.  Empty your bladder often. Avoid holding urine for long periods of time.  Empty your bladder before and after sexual intercourse.  After a bowel movement, women should cleanse from front to back. Use each tissue only once. SEEK MEDICAL CARE IF:   You have back pain.  You develop a fever.  Your symptoms do not begin to resolve within 3 days. SEEK IMMEDIATE MEDICAL CARE IF:   You have severe back pain or lower abdominal pain.  You develop chills.  You have nausea or vomiting.  You have continued burning or discomfort with urination. MAKE SURE YOU:   Understand these instructions.  Will watch your condition.  Will get help right away if you are not doing well or get worse. Document Released: 09/24/2005 Document Revised: 06/15/2012 Document Reviewed: 01/23/2012 [facility] Patient Information 2015 [place], [place]. This information is not intended to replace advice given to you by your health care provider. Make sure you discuss any questions you have with your health care provider.  Fatigue Fatigue is a feeling of tiredness, lack of energy, lack of motivation, or feeling tired all the time. Having enough rest, good nutrition, and reducing stress will normally reduce fatigue. Consult your caregiver if it persists. The nature of your fatigue will help your caregiver to find out its cause. The treatment is based on the cause.  CAUSES  There are many causes for  fatigue. Most of the time, fatigue can be traced to one or more of your habits or routines. Most causes fit into one or more of three general areas. They are: Lifestyle problems  Sleep disturbances.  Overwork.  Physical exertion.  Unhealthy habits.  Poor eating habits or eating disorders.  Alcohol and/or drug use .  Lack of  proper nutrition (malnutrition). Psychological problems  Stress and/or anxiety problems.  Depression.  Grief.  Boredom. Medical Problems or Conditions  Anemia.  Pregnancy.  Thyroid gland problems.  Recovery from major surgery.  Continuous pain.  Emphysema or asthma that is not well controlled  Allergic conditions.  Diabetes.  Infections (such as mononucleosis).  Obesity.  Sleep disorders, such as sleep apnea.  Heart failure or other heart-related problems.  Cancer.  Kidney disease.  Liver disease.  Effects of certain medicines such as antihistamines, cough and cold remedies, prescription pain medicines, heart and blood pressure medicines, drugs used for treatment of cancer, and some antidepressants. SYMPTOMS  The symptoms of fatigue include:   Lack of energy.  Lack of drive (motivation).  Drowsiness.  Feeling of indifference to the surroundings. DIAGNOSIS  The details of how you feel help guide your caregiver in finding out what is causing the fatigue. You will be asked about your present and past health condition. It is important to review all medicines that you take, including prescription and non-prescription items. A thorough exam will be done. You will be questioned about your feelings, habits, and normal lifestyle. Your caregiver may suggest blood tests, urine tests, or other tests to look for common medical causes of fatigue.  TREATMENT  Fatigue is treated by correcting the underlying cause. For example, if you have continuous pain or depression, treating these causes will improve how you feel. Similarly, adjusting the dose of certain medicines will help in reducing fatigue.  HOME CARE INSTRUCTIONS   Try to get the required amount of good sleep every night.  Eat a healthy and nutritious diet, and drink enough water throughout the day.  Practice ways of relaxing (including yoga or meditation).  Exercise regularly.  Make plans to change  situations that cause stress. Act on those plans so that stresses decrease over time. Keep your work and personal routine reasonable.  Avoid street drugs and minimize use of alcohol.  Start taking a daily multivitamin after consulting your caregiver. SEEK MEDICAL CARE IF:   You have persistent tiredness, which cannot be accounted for.  You have fever.  You have unintentional weight loss.  You have headaches.  You have disturbed sleep throughout the night.  You are feeling sad.  You have constipation.  You have dry skin.  You have gained weight.  You are taking any new or different medicines that you suspect are causing fatigue.  You are unable to sleep at night.  You develop any unusual swelling of your legs or other parts of your body. SEEK IMMEDIATE MEDICAL CARE IF:   You are feeling confused.  Your vision is blurred.  You feel faint or pass out.  You develop severe headache.  You develop severe abdominal, pelvic, or back pain.  You develop chest pain, shortness of breath, or an irregular or fast heartbeat.  You are unable to pass a normal amount of urine.  You develop abnormal bleeding such as bleeding from the rectum or you vomit blood.  You have thoughts about harming yourself or committing suicide.  You are worried that you might harm someone else. MAKE SURE YOU:  Understand these instructions.  Will watch your condition.  Will get help right away if you are not doing well or get worse. Document Released: 10/12/2007 Document Revised: 03/08/2012 Document Reviewed: 04/18/2014 [facility] Patient Information 2015 [place], [place]. This information is not intended to replace advice given to you by your health care provider. Make sure you discuss any questions you have with your health care provider.

## 2015-03-31 NOTE — ED Notes (Addendum)
MD at bedside. MD ambulating with patient.

## 2015-03-31 NOTE — Progress Notes (Signed)
ANTIBIOTIC CONSULT NOTE - INITIAL  Pharmacy Consult for Levaquin Indication: UTI   No Known Allergies  Patient Measurements:   Actual Body Weight: 66.7 kg   Vital Signs: Temp: 98.9 F (37.2 C) (04/02 1710) Temp Source: Oral (04/02 1355) BP: 147/68 mmHg (04/02 1815) Pulse Rate: 76 (04/02 1815) Intake/Output from previous day:   Intake/Output from this shift:    Labs:  Recent Labs  03/31/15 1512  WBC 8.9  HGB 10.1*  PLT 152  CREATININE 1.45*   CrCl cannot be calculated (Unknown ideal weight.). No results for input(s): VANCOTROUGH, VANCOPEAK, VANCORANDOM, GENTTROUGH, GENTPEAK, GENTRANDOM, TOBRATROUGH, TOBRAPEAK, TOBRARND, AMIKACINPEAK, AMIKACINTROU, AMIKACIN in the last 72 hours.   Microbiology: No results found for this or any previous visit (from the past 720 hour(s)).  Medical History: Past Medical History  Diagnosis Date  . Stroke   . Hypertension   . Hyperlipidemia   . Aortic stenosis   . CHF (congestive heart failure)   . MGUS (monoclonal gammopathy of unknown significance)   . Goiter   . Rhabdomyolysis   . UTI (urinary tract infection) 05/09/2014    Medications:   (Not in a hospital admission) Assessment: 51 YOM who presented with weakness. Per patient's daughter, this happens when he gets a UTI. Previous urine culture grew out acenitobacter sensitive to Levaquin in 2015. Pharmacy consulted to start Levaquin for empiric coverage. WBC wnl. Afebrile. SCr elevated at 1.45   4/2 Urine Cx>>  Goal of Therapy:  Resolution of infection   Plan:  -Give Levaquin 750 mg IV once today. Enter maintenance Levaquin dose once weight is measured -Monitor CBC, renal fx, cultures and clinical progress   Albertina Parr, PharmD., BCPS Clinical Pharmacist Pager [phone]

## 2015-03-31 NOTE — H&P (Addendum)
History and Physical  [name] LPF:[id number] DOB: December 23, 1941 DOA: 03/31/2015  Referring physician: [name] PCP: [name], MD   Chief Complaint:   HPI: Bradley [name] is a 74 y.o. male   74 year old male with history of stroke with residual right-sided deficits who presents with difficulty ambulating due to right leg weakness and confusion. Patient currently not able to provide history due to confusion, not oriented to time,place, not able to recall president's name. HPI obtained from talking to [name] and His daughter who is the main care giver. Daughter reported patient lives by self,amublate with walker, normally AAOX3, but when he gets uti he will get confused and weak, with more prominent right sided weakness, this morning daughter noticed the same, brought patient to the ED. Patient denis fever/abdominal pain,/nausea,/vomiting,/chest pain,/shortness of breath,/headaches/ dizziness/denies any urinary symptoms.  ED course: ua +uti, mild elevation of cr, troponin 0.08. He received rocephinx1 and ivf, hospitalist called for admission.  Review of Systems:  Detail per HPI, Review of systems are otherwise negative  Past Medical History  Diagnosis Date  . Stroke   . Hypertension   . Hyperlipidemia   . Aortic stenosis   . CHF (congestive heart failure)   . MGUS (monoclonal gammopathy of unknown significance)   . Goiter   . Rhabdomyolysis   . UTI (urinary tract infection) 05/09/2014   Past Surgical History  Procedure Laterality Date  . Circumcision     Social History:  reports that he has never smoked. He has never used smokeless tobacco. He reports that he does not drink alcohol or use illicit drugs. Patient lives at home & is able to participate in activities of daily living with a walker and family check on him several time of the day.  No Known Allergies  History reviewed. No pertinent family history.   Prior to Admission medications   Medication Sig Start Date End Date  Taking? Authorizing Provider  amLODipine (NORVASC) 10 MG tablet Take 10 mg by mouth daily.   Yes Historical Provider, MD  aspirin 81 MG tablet Take 81 mg by mouth daily.   Yes Historical Provider, MD  cloNIDine (CATAPRES - DOSED IN MG/24 HR) 0.3 mg/24hr Place 1 patch onto the skin once a week. Sundays   Yes Historical Provider, MD  furosemide (LASIX) 40 MG tablet Take 0.5 tablets (20 mg total) by mouth 2 (two) times daily. Take 1/2 tab once a day on 5/13 and 5/14.  Then on 5/15 resume 1/2 tab twice a day. Patient taking differently: Take 40 mg by mouth daily. Take two 40 mg tablets in the morning and one 40 mg tablet in the afternoon 05/09/14  Yes [name] York, PA-C  hydrALAZINE (APRESOLINE) 50 MG tablet Take 50-100 mg by mouth 2 (two) times daily. 100 mg in the morning and 50 mg in the evening   Yes Historical Provider, MD  irbesartan (AVAPRO) 300 MG tablet Take 300 mg by mouth daily.   Yes Historical Provider, MD  labetalol (NORMODYNE) 300 MG tablet Take 1 tablet (300 mg total) by mouth 2 (two) times daily. 05/09/14  Yes Marianne L York, PA-C  pravastatin (PRAVACHOL) 40 MG tablet Take 40 mg by mouth daily. 02/01/15  Yes Historical Provider, MD  Tamsulosin HCl (FLOMAX) 0.4 MG CAPS Take 0.4 mg by mouth 2 (two) times daily.    Yes Historical Provider, MD  cephALEXin (KEFLEX) 500 MG capsule Take 1 capsule (500 mg total) by mouth 3 (three) times daily. 03/31/15   Jenny Reichmann  Wofford, MD  ciprofloxacin (CIPRO) 500 MG tablet Take 1 tablet (500 mg total) by mouth 2 (two) times daily. Patient not taking: Reported on 12/11/2014 11/20/14   [name], MD  dextromethorphan-guaiFENesin [facility] DM) 30-600 MG per 12 hr tablet Take 1 tablet by mouth 2 (two) times daily as needed for cough. Patient not taking: Reported on 12/11/2014 11/16/14   [name], MD    Physical Exam: BP 147/68 mmHg  Pulse 76  Temp(Src) 98.9 F (37.2 C) (Oral)  Resp 33  SpO2 100%  General:  Oriented to person only, NAD, very  pleasant. Eyes: PERRL ENT: unremarkable Neck: supple, no JVD Cardiovascular: RRR, 3/6 precordial murmur Respiratory: CTABL Abdomen: soft/ND/ND, positive bowel sounds Skin: no rash Musculoskeletal:  No edema Psychiatric: calm/cooperative Neurologic: slight slurred speech, oriented to person only, slight weakness right lower extremity          Labs on Admission:  Basic Metabolic Panel:  Recent Labs Lab 03/31/15 1512  NA 137  K 3.6  CL 102  CO2 20  GLUCOSE 104*  BUN 36*  CREATININE 1.45*  CALCIUM 9.8   Liver Function Tests: No results for input(s): AST, ALT, ALKPHOS, BILITOT, PROT, ALBUMIN in the last 168 hours. No results for input(s): LIPASE, AMYLASE in the last 168 hours. No results for input(s): AMMONIA in the last 168 hours. CBC:  Recent Labs Lab 03/31/15 1512  WBC 8.9  NEUTROABS 7.2  HGB 10.1*  HCT 31.4*  MCV 84.6  PLT 152   Cardiac Enzymes:  Recent Labs Lab 03/31/15 1512  TROPONINI 0.08*    BNP (last 3 results) No results for input(s): BNP in the last 8760 hours.  ProBNP (last 3 results)  Recent Labs  11/16/14 1842 12/11/14 1448  PROBNP 5173.0* 1169.0*    CBG: No results for input(s): GLUCAP in the last 168 hours.  Radiological Exams on Admission: Dg Chest Port 1 View  03/31/2015   CLINICAL DATA:  Fatigue  EXAM: PORTABLE CHEST - 1 VIEW  COMPARISON:  11/16/2014  FINDINGS: Cardiac shadow is enlarged. Aortic calcifications are again noted. No focal infiltrate or sizable effusion is seen. No bony abnormality is noted.  IMPRESSION: No active disease.   Electronically Signed   By: Inez Catalina M.D.   On: 03/31/2015 15:56    EKG: Independently reviewed. NSR/1st degree av block, nonspecific Interventricular conduction delay. Compare to prior EKG no acute changes  Assessment/Plan Present on Admission:  . UTI (urinary tract infection) . UTI (lower urinary tract infection)   UTI with metabolic encephalopathy: no fever, no leukocytosis, bp  stable. Review past urine culture, insensitive to rocephin but all sensitive to quinolones, will change abx to levaquin pharmacy to dose.   Mild elevation cr, gentle hydration, hold lasix/ARBs.  Mild elevation of troponin, no chest pain, no acute EKG changes, likely troponin leak in the setting of UTI/elevation of cr. Will continue cycle enzymes. Daughter reported patient just had an echocardiogram done at cardiology's office two month ago, was told stable, next cardiology office visit in May.  HTN; hold lasix/arbs, continue clonidine/labetalol/hydralazine.   H/o CVA last in 2013, daughter reported patient has had several CVA's in the past, last in 2013. He is only on asa 81, daughter reported patient was not taking asa prior to last stroke. Advised daughter to f/u with neurology. Consider repeat brain imaging if presenting symptom not improving by treating UTI.  H/o Aortic stenosis: no chest pain, no syncope. reported  followed by cardiology closely. had echo done  recently.  DVT prophylaxis with lovenox 30mg  subQ   Consultants: none  Code Status: full  Family Communication:  Patient/duaghter/wife  Disposition Plan: admit to obs tele  Time spent: >59mins  Alaira Level MD, PhD [facility] Pager [phone] If 7PM-7AM, please contact night-coverage at [email or web address], password [facility]

## 2015-03-31 NOTE — ED Provider Notes (Signed)
CSN: [id number]     Arrival date & time 03/31/15  1341 History   First MD Initiated Contact with Patient 03/31/15 1437     Chief Complaint  Patient presents with  . Fatigue     (Consider location/radiation/quality/duration/timing/severity/associated sxs/prior Treatment) HPI Comments: 74 year old male with history of stroke with residual right-sided deficits who presents with difficulty ambulating due to right leg weakness. His daughter specifically brought into the emergency room because of "walking more slowly than usual".  She suspected that he might have a urinary tract infection. No fevers. He denies abdominal pain, nausea, vomiting, chest pain, shortness of breath, headaches, dizziness.  Patient is a 74 y.o. male presenting with weakness.  Weakness This is a new problem. Episode onset: daughter first noticed today. The problem occurs constantly. The problem has not changed since onset.Pertinent negatives include no chest pain, no abdominal pain and no shortness of breath. Exacerbated by: wmbulating. Nothing relieves the symptoms. He has tried nothing for the symptoms.    Past Medical History  Diagnosis Date  . Stroke   . Hypertension   . Hyperlipidemia   . Aortic stenosis   . CHF (congestive heart failure)   . MGUS (monoclonal gammopathy of unknown significance)   . Goiter   . Rhabdomyolysis   . UTI (urinary tract infection) 05/09/2014   Past Surgical History  Procedure Laterality Date  . Circumcision     History reviewed. No pertinent family history. History  Substance Use Topics  . Smoking status: Never Smoker   . Smokeless tobacco: Never Used  . Alcohol Use: No    Review of Systems  Respiratory: Negative for shortness of breath.   Cardiovascular: Negative for chest pain.  Gastrointestinal: Negative for abdominal pain.  Neurological: Positive for weakness.  All other systems reviewed and are negative.     Allergies  Review of patient's allergies indicates no  known allergies.  Home Medications   Prior to Admission medications   Medication Sig Start Date End Date Taking? Authorizing Provider  amLODipine (NORVASC) 10 MG tablet Take 10 mg by mouth daily.    Historical Provider, MD  aspirin 81 MG tablet Take 81 mg by mouth daily.    Historical Provider, MD  cephALEXin (KEFLEX) 500 MG capsule Take 1 capsule (500 mg total) by mouth 3 (three) times daily. 03/31/15   Serita Grit, MD  ciprofloxacin (CIPRO) 500 MG tablet Take 1 tablet (500 mg total) by mouth 2 (two) times daily. Patient not taking: Reported on 12/11/2014 11/20/14   [name], MD  cloNIDine (CATAPRES - DOSED IN MG/24 HR) 0.3 mg/24hr Place 1 patch onto the skin once a week. Sundays    Historical Provider, MD  dextromethorphan-guaiFENesin [facility] DM) 30-600 MG per 12 hr tablet Take 1 tablet by mouth 2 (two) times daily as needed for cough. Patient not taking: Reported on 12/11/2014 11/16/14   [name], MD  furosemide (LASIX) 40 MG tablet Take 0.5 tablets (20 mg total) by mouth 2 (two) times daily. Take 1/2 tab once a day on 5/13 and 5/14.  Then on 5/15 resume 1/2 tab twice a day. Patient taking differently: Take 40 mg by mouth daily. Take two 40 mg tablets in the morning and one 40 mg tablet in the afternoon 05/09/14   [name] York, PA-C  hydrALAZINE (APRESOLINE) 50 MG tablet Take 50-100 mg by mouth 2 (two) times daily. 100 mg in the morning and 50 mg in the evening    Historical Provider, MD  irbesartan (  AVAPRO) 300 MG tablet Take 300 mg by mouth daily.    Historical Provider, MD  labetalol (NORMODYNE) 300 MG tablet Take 1 tablet (300 mg total) by mouth 2 (two) times daily. 05/09/14   [name] York, PA-C  pravastatin (PRAVACHOL) 20 MG tablet Take 1 tablet (20 mg total) by mouth every evening. 04/30/12   Fay Records, MD  Tamsulosin HCl (FLOMAX) 0.4 MG CAPS Take 0.4 mg by mouth daily.    Historical Provider, MD   BP 172/69 mmHg  Pulse 79  Temp(Src) 98.9 F (37.2 C) (Oral)  Resp 20   SpO2 96% Physical Exam  Constitutional: He is oriented to person, place, and time. He appears well-developed and well-nourished. No distress.  HENT:  Head: Normocephalic and atraumatic.  Mouth/Throat: Oropharynx is clear and moist.  Eyes: Conjunctivae are normal. Pupils are equal, round, and reactive to light. No scleral icterus.  Neck: Neck supple.  Cardiovascular: Normal rate, regular rhythm, normal heart sounds and intact distal pulses.   No murmur heard. Pulmonary/Chest: Effort normal and breath sounds normal. No stridor. No respiratory distress. He has no wheezes. He has no rales.  Abdominal: Soft. He exhibits no distension. There is no tenderness.  Musculoskeletal: Normal range of motion. He exhibits no edema.  Neurological: He is alert and oriented to person, place, and time. Gait (shuffling. Required effort to pull right leg forward.) abnormal.  4+/5 strength in right upper and right lower extremities. 5/5 strength in left upper and left lower extremities.  Skin: Skin is warm and dry. No rash noted.  Psychiatric: He has a normal mood and affect. His behavior is normal.  Nursing note and vitals reviewed.   ED Course  Procedures (including critical care time) Labs Review Labs Reviewed  CBC WITH DIFFERENTIAL/PLATELET - Abnormal; Notable for the following:    RBC 3.71 (*)    Hemoglobin 10.1 (*)    HCT 31.4 (*)    Neutrophils Relative % 81 (*)    Lymphocytes Relative 11 (*)    All other components within normal limits  BASIC METABOLIC PANEL - Abnormal; Notable for the following:    Glucose, Bld 104 (*)    BUN 36 (*)    Creatinine, Ser 1.45 (*)    GFR calc non Af Amer 46 (*)    GFR calc Af Amer 54 (*)    All other components within normal limits  URINALYSIS, ROUTINE W REFLEX MICROSCOPIC - Abnormal; Notable for the following:    APPearance CLOUDY (*)    Leukocytes, UA LARGE (*)    All other components within normal limits  TROPONIN I - Abnormal; Notable for the following:     Troponin I 0.08 (*)    All other components within normal limits  URINE MICROSCOPIC-ADD ON - Abnormal; Notable for the following:    Bacteria, UA MANY (*)    All other components within normal limits    Imaging Review Dg Chest Port 1 View  03/31/2015   CLINICAL DATA:  Fatigue  EXAM: PORTABLE CHEST - 1 VIEW  COMPARISON:  11/16/2014  FINDINGS: Cardiac shadow is enlarged. Aortic calcifications are again noted. No focal infiltrate or sizable effusion is seen. No bony abnormality is noted.  IMPRESSION: No active disease.   Electronically Signed   By: Inez Catalina M.D.   On: 03/31/2015 15:56  All radiology studies independently viewed by me.      EKG Interpretation   Date/Time:  Saturday March 31 2015 13:50:00 EDT Ventricular Rate:  74  PR Interval:  412 QRS Duration: 78 QT Interval:  400 QTC Calculation: 444 R Axis:   91 Text Interpretation:  Sinus rhythm with 1st degree A-V block Rightward  axis Low voltage QRS Cannot rule out Anteroseptal infarct , age  undetermined T wave abnormality, consider lateral ischemia Abnormal ECG No  significant change was found Confirmed by [facility]  MD, TREY ([id number]) on  03/31/2015 2:59:23 PM      MDM   Final diagnoses:  Fatigue  Acute cystitis without hematuria  Elevated troponin I level    74 year old male with generalized weakness and difficulty ambulating. He has a history of stroke with right-sided deficits. It appears that these deficits are slightly more pronounced than typical. His daughter states that this happens when he gets a urinary tract infection. Indeed, he has a urinary tract infection today. I suspect this is the cause of his difficulty ambulating.  His lab tests also demonstrated slightly elevated troponin. For this reason, will give IV antibiotics, IV fluids, and admit to medicine.    Serita Grit, MD 03/31/15 [phone]

## 2015-03-31 NOTE — ED Notes (Signed)
Attempted report 

## 2015-04-01 DIAGNOSIS — I350 Nonrheumatic aortic (valve) stenosis: Secondary | ICD-10-CM

## 2015-04-01 DIAGNOSIS — G9340 Encephalopathy, unspecified: Secondary | ICD-10-CM

## 2015-04-01 DIAGNOSIS — R7989 Other specified abnormal findings of blood chemistry: Secondary | ICD-10-CM

## 2015-04-01 LAB — COMPREHENSIVE METABOLIC PANEL
ALBUMIN: 3.1 g/dL — AB (ref 3.5–5.2)
ALT: 23 U/L (ref 0–53)
AST: 22 U/L (ref 0–37)
Alkaline Phosphatase: 79 U/L (ref 39–117)
Anion gap: 11 (ref 5–15)
BUN: 34 mg/dL — ABNORMAL HIGH (ref 6–23)
CHLORIDE: 104 mmol/L (ref 96–112)
CO2: 22 mmol/L (ref 19–32)
CREATININE: 1.51 mg/dL — AB (ref 0.50–1.35)
Calcium: 9.6 mg/dL (ref 8.4–10.5)
GFR, EST AFRICAN AMERICAN: 51 mL/min — AB (ref >90)
GFR, EST NON AFRICAN AMERICAN: 44 mL/min — AB (ref >90)
GLUCOSE: 123 mg/dL — AB (ref 70–99)
Potassium: 3.4 mmol/L — ABNORMAL LOW (ref 3.5–5.1)
SODIUM: 137 mmol/L (ref 135–145)
Total Bilirubin: 0.7 mg/dL (ref 0.3–1.2)
Total Protein: 7.4 g/dL (ref 6.0–8.3)

## 2015-04-01 LAB — TROPONIN I
Troponin I: 0.19 ng/mL — ABNORMAL HIGH (ref <0.031)
Troponin I: 0.39 ng/mL — ABNORMAL HIGH (ref <0.031)

## 2015-04-01 LAB — CBC
HCT: 30.8 % — ABNORMAL LOW (ref 39.0–52.0)
HEMOGLOBIN: 9.8 g/dL — AB (ref 13.0–17.0)
MCH: 27.1 pg (ref 26.0–34.0)
MCHC: 31.8 g/dL (ref 30.0–36.0)
MCV: 85.1 fL (ref 78.0–100.0)
Platelets: 156 10*3/uL (ref 150–400)
RBC: 3.62 MIL/uL — AB (ref 4.22–5.81)
RDW: 13.5 % (ref 11.5–15.5)
WBC: 9 10*3/uL (ref 4.0–10.5)

## 2015-04-01 LAB — TSH: TSH: 0.028 u[IU]/mL — AB (ref 0.350–4.500)

## 2015-04-01 MED ORDER — ENOXAPARIN SODIUM 40 MG/0.4ML ~~LOC~~ SOLN
40.0000 mg | Freq: Every day | SUBCUTANEOUS | Status: DC
  Administered 2015-04-01 – 2015-04-04 (×4): 40 mg via SUBCUTANEOUS
  Filled 2015-04-01 (×4): qty 0.4

## 2015-04-01 MED ORDER — ALBUTEROL SULFATE (2.5 MG/3ML) 0.083% IN NEBU
2.5000 mg | INHALATION_SOLUTION | RESPIRATORY_TRACT | Status: DC | PRN
  Administered 2015-04-01 – 2015-04-02 (×2): 2.5 mg via RESPIRATORY_TRACT
  Filled 2015-04-01 (×2): qty 3

## 2015-04-01 MED ORDER — POTASSIUM CHLORIDE CRYS ER 20 MEQ PO TBCR
20.0000 meq | EXTENDED_RELEASE_TABLET | Freq: Once | ORAL | Status: AC
  Administered 2015-04-01: 20 meq via ORAL
  Filled 2015-04-01: qty 1

## 2015-04-01 MED ORDER — MAGNESIUM SULFATE 2 GM/50ML IV SOLN
2.0000 g | Freq: Once | INTRAVENOUS | Status: AC
  Administered 2015-04-01: 2 g via INTRAVENOUS
  Filled 2015-04-01: qty 50

## 2015-04-01 MED ORDER — POTASSIUM CHLORIDE CRYS ER 20 MEQ PO TBCR
20.0000 meq | EXTENDED_RELEASE_TABLET | Freq: Every day | ORAL | Status: DC
  Administered 2015-04-01 – 2015-04-05 (×5): 20 meq via ORAL
  Filled 2015-04-01 (×6): qty 1

## 2015-04-01 MED ORDER — FUROSEMIDE 10 MG/ML IJ SOLN
20.0000 mg | Freq: Once | INTRAMUSCULAR | Status: AC
  Administered 2015-04-01: 20 mg via INTRAVENOUS
  Filled 2015-04-01: qty 2

## 2015-04-01 MED ORDER — FUROSEMIDE 20 MG PO TABS
20.0000 mg | ORAL_TABLET | Freq: Two times a day (BID) | ORAL | Status: DC
  Administered 2015-04-01 – 2015-04-02 (×2): 20 mg via ORAL
  Filled 2015-04-01 (×4): qty 1

## 2015-04-01 NOTE — Consult Note (Signed)
Reason for Consult: elevated troponin and severe AS Primary Cardiologist: Dr. Harrington Challenger Referring Physician: Dr. Mallory Shirk Bradley [name] is an 74 y.o. male.  HPI: 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 Cardiology consulted for troponin of 0.19. [name] has improved some with IV antibiotics. He is accompanied by his daughter. She says he is more with it. [name] denies any chest discomfort. [name] knows about his tight AV and plans to discuss further options with Dr. Harrington Challenger. He saw Dr. Harrington Challenger in December 2015. He's had a negative myoview in 2012.      Past Medical History  Diagnosis Date  . Stroke   . Hypertension   . Hyperlipidemia   . Aortic stenosis   . CHF (congestive heart failure)   . MGUS (monoclonal gammopathy of unknown significance)   . Goiter   . Rhabdomyolysis   . UTI (urinary tract infection) 05/09/2014    Past Surgical History  Procedure Laterality Date  . Circumcision      History reviewed. No pertinent family history.  Social History:  reports that he has never smoked. He has never used smokeless tobacco. He reports that he does not drink alcohol or use illicit drugs.  Allergies: No Known Allergies  Medications:  I have reviewed the patient's current medications. Prior to Admission:  Prescriptions prior to admission  Medication Sig Dispense Refill Last Dose  . amLODipine (NORVASC) 10 MG tablet Take 10 mg by mouth daily.   03/31/2015 at Unknown time  . aspirin 81 MG tablet Take 81 mg by mouth daily.   03/31/2015 at Unknown time  . cloNIDine (CATAPRES - DOSED IN MG/24 HR) 0.3 mg/24hr Place 1 patch onto the skin once a week. Sundays   03/31/2015 at Unknown time  . furosemide (LASIX) 40 MG tablet Take 0.5 tablets (20 mg total) by mouth 2 (two)  times daily. Take 1/2 tab once a day on 5/13 and 5/14.  Then on 5/15 resume 1/2 tab twice a day. (Patient taking differently: Take 40 mg by mouth daily. Take two 40 mg tablets in the morning and one 40 mg tablet in the afternoon) 30 tablet 0 03/31/2015 at Unknown time  . hydrALAZINE (APRESOLINE) 50 MG tablet Take 50-100 mg by mouth 2 (two) times daily. 100 mg in the morning and 50 mg in the evening   03/31/2015 at Unknown time  . irbesartan (AVAPRO) 300 MG tablet Take 300 mg by mouth daily.   03/31/2015 at Unknown time  . labetalol (NORMODYNE) 300 MG tablet Take 1 tablet (300 mg total) by mouth 2 (two) times daily.   03/31/2015 at 0600  . pravastatin (PRAVACHOL) 40 MG tablet Take 40 mg by mouth daily.  1 03/31/2015 at Unknown time  . Tamsulosin HCl (FLOMAX) 0.4 MG CAPS Take 0.4 mg by mouth 2 (two) times daily.    Past Month at Unknown time  . ciprofloxacin (CIPRO) 500 MG tablet Take 1 tablet (500 mg total) by mouth 2 (two) times daily. (Patient not taking: Reported on 12/11/2014) 14 tablet 0 Not Taking at Unknown time  . dextromethorphan-guaiFENesin (MUCINEX DM) 30-600 MG per 12 hr tablet Take 1 tablet by mouth 2 (two) times daily as needed for cough. (Patient not taking: Reported on 12/11/2014) 20 tablet 0 Not Taking  at Unknown time   Scheduled: . amLODipine  10 mg Oral Daily  . aspirin EC  81 mg Oral Daily  . cloNIDine  0.3 mg Transdermal Weekly  . enoxaparin (LOVENOX) injection  40 mg Subcutaneous Daily  . furosemide  20 mg Oral BID  . hydrALAZINE  100 mg Oral Daily  . hydrALAZINE  50 mg Oral QHS  . labetalol  300 mg Oral BID  . [START ON 04/02/2015] levofloxacin (LEVAQUIN) IV  500 mg Intravenous Q48H  . potassium chloride  20 mEq Oral Daily  . pravastatin  40 mg Oral Daily  . sodium chloride  3 mL Intravenous Q12H  . tamsulosin  0.4 mg Oral BID   Continuous:   Results for orders placed or performed during the hospital encounter of 03/31/15 (from the past 48 hour(s))  CBC with Differential/Platelet      Status: Abnormal   Collection Time: 03/31/15  3:12 PM  Result Value Ref Range   WBC 8.9 4.0 - 10.5 K/uL   RBC 3.71 (L) 4.22 - 5.81 MIL/uL   Hemoglobin 10.1 (L) 13.0 - 17.0 g/dL   HCT 31.4 (L) 39.0 - 52.0 %   MCV 84.6 78.0 - 100.0 fL   MCH 27.2 26.0 - 34.0 pg   MCHC 32.2 30.0 - 36.0 g/dL   RDW 13.5 11.5 - 15.5 %   Platelets 152 150 - 400 K/uL   Neutrophils Relative % 81 (H) 43 - 77 %   Neutro Abs 7.2 1.7 - 7.7 K/uL   Lymphocytes Relative 11 (L) 12 - 46 %   Lymphs Abs 1.0 0.7 - 4.0 K/uL   Monocytes Relative 8 3 - 12 %   Monocytes Absolute 0.7 0.1 - 1.0 K/uL   Eosinophils Relative 0 0 - 5 %   Eosinophils Absolute 0.0 0.0 - 0.7 K/uL   Basophils Relative 0 0 - 1 %   Basophils Absolute 0.0 0.0 - 0.1 K/uL  Basic metabolic panel     Status: Abnormal   Collection Time: 03/31/15  3:12 PM  Result Value Ref Range   Sodium 137 135 - 145 mmol/L   Potassium 3.6 3.5 - 5.1 mmol/L   Chloride 102 96 - 112 mmol/L   CO2 20 19 - 32 mmol/L   Glucose, Bld 104 (H) 70 - 99 mg/dL   BUN 36 (H) 6 - 23 mg/dL   Creatinine, Ser 1.45 (H) 0.50 - 1.35 mg/dL   Calcium 9.8 8.4 - 10.5 mg/dL   GFR calc non Af Amer 46 (L) >90 mL/min   GFR calc Af Amer 54 (L) >90 mL/min    Comment: (NOTE) The eGFR has been calculated using the CKD EPI equation. This calculation has not been validated in all clinical situations. eGFR's persistently <90 mL/min signify possible Chronic Kidney Disease.    Anion gap 15 5 - 15  Troponin I     Status: Abnormal   Collection Time: 03/31/15  3:12 PM  Result Value Ref Range   Troponin I 0.08 (H) <0.031 ng/mL    Comment:        PERSISTENTLY INCREASED TROPONIN VALUES IN THE RANGE OF 0.04-0.49 ng/mL CAN BE SEEN IN:       -UNSTABLE ANGINA       -CONGESTIVE HEART FAILURE       -MYOCARDITIS       -CHEST TRAUMA       -ARRYHTHMIAS       -LATE PRESENTING MYOCARDIAL INFARCTION       -COPD  CLINICAL FOLLOW-UP RECOMMENDED.   Urinalysis, Routine w reflex microscopic     Status:  Abnormal   Collection Time: 03/31/15  3:21 PM  Result Value Ref Range   Color, Urine YELLOW YELLOW   APPearance CLOUDY (A) CLEAR   Specific Gravity, Urine 1.011 1.005 - 1.030   pH 6.5 5.0 - 8.0   Glucose, UA NEGATIVE NEGATIVE mg/dL   Hgb urine dipstick NEGATIVE NEGATIVE   Bilirubin Urine NEGATIVE NEGATIVE   Ketones, ur NEGATIVE NEGATIVE mg/dL   Protein, ur NEGATIVE NEGATIVE mg/dL   Urobilinogen, UA 0.2 0.0 - 1.0 mg/dL   Nitrite NEGATIVE NEGATIVE   Leukocytes, UA LARGE (A) NEGATIVE  Urine microscopic-add on     Status: Abnormal   Collection Time: 03/31/15  3:21 PM  Result Value Ref Range   Squamous Epithelial / LPF RARE RARE   WBC, UA TOO NUMEROUS TO COUNT <3 WBC/hpf   Bacteria, UA MANY (A) RARE  Comprehensive metabolic panel     Status: Abnormal   Collection Time: 04/01/15  7:05 AM  Result Value Ref Range   Sodium 137 135 - 145 mmol/L   Potassium 3.4 (L) 3.5 - 5.1 mmol/L   Chloride 104 96 - 112 mmol/L   CO2 22 19 - 32 mmol/L   Glucose, Bld 123 (H) 70 - 99 mg/dL   BUN 34 (H) 6 - 23 mg/dL   Creatinine, Ser 1.51 (H) 0.50 - 1.35 mg/dL   Calcium 9.6 8.4 - 10.5 mg/dL   Total Protein 7.4 6.0 - 8.3 g/dL   Albumin 3.1 (L) 3.5 - 5.2 g/dL   AST 22 0 - 37 U/L   ALT 23 0 - 53 U/L   Alkaline Phosphatase 79 39 - 117 U/L   Total Bilirubin 0.7 0.3 - 1.2 mg/dL   GFR calc non Af Amer 44 (L) >90 mL/min   GFR calc Af Amer 51 (L) >90 mL/min    Comment: (NOTE) The eGFR has been calculated using the CKD EPI equation. This calculation has not been validated in all clinical situations. eGFR's persistently <90 mL/min signify possible Chronic Kidney Disease.    Anion gap 11 5 - 15  CBC     Status: Abnormal   Collection Time: 04/01/15  7:05 AM  Result Value Ref Range   WBC 9.0 4.0 - 10.5 K/uL   RBC 3.62 (L) 4.22 - 5.81 MIL/uL   Hemoglobin 9.8 (L) 13.0 - 17.0 g/dL   HCT 30.8 (L) 39.0 - 52.0 %   MCV 85.1 78.0 - 100.0 fL   MCH 27.1 26.0 - 34.0 pg   MCHC 31.8 30.0 - 36.0 g/dL   RDW 13.5 11.5  - 15.5 %   Platelets 156 150 - 400 K/uL  Troponin I (q 6hr x 3)     Status: Abnormal   Collection Time: 04/01/15  3:57 PM  Result Value Ref Range   Troponin I 0.19 (H) <0.031 ng/mL    Comment:        PERSISTENTLY INCREASED TROPONIN VALUES IN THE RANGE OF 0.04-0.49 ng/mL CAN BE SEEN IN:       -UNSTABLE ANGINA       -CONGESTIVE HEART FAILURE       -MYOCARDITIS       -CHEST TRAUMA       -ARRYHTHMIAS       -LATE PRESENTING MYOCARDIAL INFARCTION       -COPD   CLINICAL FOLLOW-UP RECOMMENDED.     Dg Chest Port 1 View  03/31/2015  CLINICAL DATA:  Fatigue  EXAM: PORTABLE CHEST - 1 VIEW  COMPARISON:  11/16/2014  FINDINGS: Cardiac shadow is enlarged. Aortic calcifications are again noted. No focal infiltrate or sizable effusion is seen. No bony abnormality is noted.  IMPRESSION: No active disease.   Electronically Signed   By: Inez Catalina M.D.   On: 03/31/2015 15:56    Review of Systems  Constitutional: Positive for malaise/fatigue. Negative for fever and chills.  HENT: Negative for ear pain.   Eyes: Negative for blurred vision and photophobia.  Respiratory: Positive for shortness of breath. Negative for cough and hemoptysis.   Cardiovascular: Negative for chest pain and palpitations.  Gastrointestinal: Negative for heartburn, nausea and vomiting.  Genitourinary: Negative for hematuria.  Musculoskeletal: Negative for myalgias and neck pain.  Skin: Negative for rash.  Neurological: Positive for weakness. Negative for dizziness, tingling and headaches.  Endo/Heme/Allergies: Negative for polydipsia.  Psychiatric/Behavioral: Negative for depression, suicidal ideas and substance abuse.   Blood pressure 163/89, pulse 87, temperature 98.9 F (37.2 C), temperature source Oral, resp. rate 16, height '5\' 5"'  (1.651 m), weight 65 kg (143 lb 4.8 oz), SpO2 89 %. Physical Exam  Nursing note and vitals reviewed. Constitutional: He appears well-developed and well-nourished. No distress.  HENT:    Head: Normocephalic and atraumatic.  Nose: Nose normal.  Mouth/Throat: Oropharynx is clear and moist. No oropharyngeal exudate.  Eyes: Conjunctivae and EOM are normal. Pupils are equal, round, and reactive to light.  Neck: Normal range of motion. Neck supple. JVD present.  JVP 2-3 cm above clavicle  Cardiovascular: Normal rate, regular rhythm and intact distal pulses.   Murmur heard. Holosystolic murmur at RUSB/LUSB  Respiratory: Effort normal. No respiratory distress. He has rales.  Rales at bases bilaterally   GI: Soft. Bowel sounds are normal. He exhibits no distension. There is no tenderness.  Musculoskeletal: He exhibits no edema or tenderness.  Neurological: He is alert. Coordination normal.  Oriented to person, place  Skin: Skin is warm and dry. No rash noted. He is not diaphoretic. No erythema.  Psychiatric: He has a normal mood and affect. His behavior is normal.   Echo 12/18/14: EF 55-60%, mildly dilated LA, severely calcified leaflets, mild AI, mean gradient 58 mmHg, AVA 0.89 cm2 Labs reviewed; cr 1.5, bun 34, na 137, K 3.4, albumin 3.1, ast/alt 32/23, Trop 0.19 ECG reviewed; anteroseptal infarct with lateral ST depression  Assessment/Plan: Problem List UTI with encephalopathy  Elevated troponin Severe AS with last mean gradient 58 mmHg Hypertension Stage III CKD Prior CVA 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 Differential diagnosis for elevated troponin includes renal failure with poor clearance, vasospasm, demand ischemia (Type II NSTEMI), ACS/NSTEMI, pulmonary embolism, heart failure among many other etiologies. I favor a diagnosis of type II NSTEMI/demand ischemia in setting of infection and known severe AS; For now, would continue to trend cardiac  biomarkers. Defer anticoagulation for now unless other signs of ischemic develop or clinical picture changes. Daily aspirin.  - no indication for heparin currently - trend cardiac biomarkers, telemetry - daily aspirin 81 mg - confirm follow-up to discuss Severe AS  Bradley [name] 04/01/2015, 8:41 PM

## 2015-04-01 NOTE — Progress Notes (Signed)
[facility] PROGRESS NOTE  ZAKAR BROSCH SWN:[id number] DOB: [birth year] DOA: 03/31/2015 PCP: [name], MD  Assessment/Plan: 74 y/o male with PMH of HTN, CHF, CKD, Aortic stenosis, CVA with residual right-sided deficits, h/o UTIs presented with confusion, generalized weakness -admitted with UTI, encephalopathy   1. 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 UTI, started on IV atx; pend cultures; no fever, no leukocytosis, bp stable.  3. Mild elevation of troponin, no chest pain, no acute EKG changes, likely troponin leak in the setting of UTI/CDK and CHF. Daughter reported patient just had an echocardiogram done at cardiology's office two month ago, was told stable, next cardiology office visit in May. Will check serial trop  4. HTN; continue clonidine/labetalol/hydralazine. Resume lasix 5. H/o CVA last in 2013; He is only on asa 65; neuro exam non focal; no new symptoms;  6. H/o Aortic stenosis: no chest pain, no syncope. reported followed by cardiology closely. had echo done recently;   7. CHF, chronic likely due to severe AS; echo: LVEF 55-60%,LVH, severe AS with mean gradient at 58 mmHg; -lasix was on hold on admission; will resume today; patient is mild hypervolemic; cont close monitor renal function; I/O  Code Status: full Family Communication: d/w patient, her daughter  (indicate person spoken with, relationship, and if by phone, the number) Disposition Plan: pend clinical improvement;PT   Consultants:  none  Procedures:  none  Antibiotics:  Levofloxacin 4/2>>   (indicate start date, and stop date if known)  HPI/Subjective: Alert, confused to place   Objective: Filed Vitals:   04/01/15 0538  BP: 154/73   Pulse: 82  Temp: 99.4 F (37.4 C)  Resp: 24    Intake/Output Summary (Last 24 hours) at 04/01/15 1152 Last data filed at 04/01/15 0955  Gross per 24 hour  Intake    363 ml  Output    900 ml  Net   -537 ml   Filed Weights   03/31/15 1907  Weight: 65 kg (143 lb 4.8 oz)    Exam:   General:  Alert, confused to place   Cardiovascular: X3,G1 ejection/systolic mr   Respiratory: few carckles at bases   Abdomen: soft, nt,nd   Musculoskeletal: no leg edema   Data Reviewed: Basic Metabolic Panel:  Recent Labs Lab 03/31/15 1512 04/01/15 0705  NA 137 137  K 3.6 3.4*  CL 102 104  CO2 20 22  GLUCOSE 104* 123*  BUN 36* 34*  CREATININE 1.45* 1.51*  CALCIUM 9.8 9.6   Liver Function Tests:  Recent Labs Lab 04/01/15 0705  AST 22  ALT 23  ALKPHOS 79  BILITOT 0.7  PROT 7.4  ALBUMIN 3.1*   No results for input(s): LIPASE, AMYLASE in the last 168 hours. No results for input(s): AMMONIA in the last 168 hours. CBC:  Recent Labs Lab 03/31/15 1512 04/01/15 0705  WBC 8.9 9.0  NEUTROABS 7.2  --   HGB 10.1* 9.8*  HCT 31.4* 30.8*  MCV 84.6 85.1  PLT 152 156   Cardiac Enzymes:  Recent Labs Lab 03/31/15 1512  TROPONINI 0.08*   BNP (last 3 results) No results for input(s): BNP in the last 8760 hours.  ProBNP (last 3 results)  Recent Labs  11/16/14 1842 12/11/14 1448  PROBNP 5173.0* 1169.0*  CBG: No results for input(s): GLUCAP in the last 168 hours.  No results found for this or any previous visit (from the past 240 hour(s)).   Studies: Dg Chest Port 1 View  03/31/2015   CLINICAL DATA:  Fatigue  EXAM: PORTABLE CHEST - 1 VIEW  COMPARISON:  11/16/2014  FINDINGS: Cardiac shadow is enlarged. Aortic calcifications are again noted. No focal infiltrate or sizable effusion is seen. No bony abnormality is noted.  IMPRESSION: No active disease.   Electronically Signed   By: Inez Catalina M.D.   On: 03/31/2015 15:56    Scheduled Meds: . amLODipine  10 mg  Oral Daily  . aspirin EC  81 mg Oral Daily  . cloNIDine  0.3 mg Transdermal Weekly  . enoxaparin (LOVENOX) injection  30 mg Subcutaneous Q24H  . hydrALAZINE  100 mg Oral Daily  . hydrALAZINE  50 mg Oral QHS  . labetalol  300 mg Oral BID  . [START ON 04/02/2015] levofloxacin (LEVAQUIN) IV  500 mg Intravenous Q48H  . pravastatin  40 mg Oral Daily  . sodium chloride  3 mL Intravenous Q12H  . tamsulosin  0.4 mg Oral BID   Continuous Infusions: . sodium chloride 50 mL/hr at 03/31/15 2045    Active Problems:   UTI (lower urinary tract infection)   UTI (urinary tract infection)    Time spent: >35 minutes     Kinnie Feil  [facility] Pager [phone]. If 7PM-7AM, please contact night-coverage at [email or web address], password [facility] 04/01/2015, 11:52 AM  LOS: 1 day

## 2015-04-01 NOTE — Progress Notes (Addendum)
Addendum: tele noted junctional rhythm. Patient has no symptoms. But he has severe aortic stenosis. D/w patient, his daughter again, recommended cardiology evaluation inpatient. They preferred outpatient follow up with Dr. Harrington Challenger  -will replace potassium, give Mg; obtain serial troponin's. check TSH add on; close monitor on tele   Bradley [name] 04/01/2015   Labs: troponin is mild elevated, with no acute chest pains. I called d/w cardiology Dr.Kelly who did not recommend anticoagulation at this time, but to cont monitor with serial troponins. We will cont to monitor, defer further management to cardiology.  Nykolas Bacallao N 6:01 PM

## 2015-04-01 NOTE — Progress Notes (Signed)
Pt noted w/ having short of breath w/ O2 sat on the 80's.Placed pt on 3L 02 via [place] and went up to 92% and RR-24. NP Rogue Bussing was notified and RT at bedside. Lasix 20mg  IV x1 dose given. Kept pt on semi-sitting position. Will continue to monitor.

## 2015-04-02 ENCOUNTER — Inpatient Hospital Stay (HOSPITAL_COMMUNITY): Payer: Medicare Other

## 2015-04-02 DIAGNOSIS — I248 Other forms of acute ischemic heart disease: Secondary | ICD-10-CM | POA: Diagnosis present

## 2015-04-02 DIAGNOSIS — R7989 Other specified abnormal findings of blood chemistry: Secondary | ICD-10-CM | POA: Diagnosis present

## 2015-04-02 DIAGNOSIS — E785 Hyperlipidemia, unspecified: Secondary | ICD-10-CM | POA: Diagnosis present

## 2015-04-02 DIAGNOSIS — I350 Nonrheumatic aortic (valve) stenosis: Secondary | ICD-10-CM | POA: Diagnosis present

## 2015-04-02 DIAGNOSIS — I5033 Acute on chronic diastolic (congestive) heart failure: Secondary | ICD-10-CM

## 2015-04-02 DIAGNOSIS — N189 Chronic kidney disease, unspecified: Secondary | ICD-10-CM

## 2015-04-02 DIAGNOSIS — I5031 Acute diastolic (congestive) heart failure: Secondary | ICD-10-CM

## 2015-04-02 LAB — BASIC METABOLIC PANEL
Anion gap: 8 (ref 5–15)
BUN: 40 mg/dL — AB (ref 6–23)
CALCIUM: 9.5 mg/dL (ref 8.4–10.5)
CO2: 24 mmol/L (ref 19–32)
Chloride: 110 mmol/L (ref 96–112)
Creatinine, Ser: 1.65 mg/dL — ABNORMAL HIGH (ref 0.50–1.35)
GFR, EST AFRICAN AMERICAN: 46 mL/min — AB (ref >90)
GFR, EST NON AFRICAN AMERICAN: 40 mL/min — AB (ref >90)
Glucose, Bld: 146 mg/dL — ABNORMAL HIGH (ref 70–99)
POTASSIUM: 4 mmol/L (ref 3.5–5.1)
Sodium: 142 mmol/L (ref 135–145)

## 2015-04-02 LAB — T4, FREE: Free T4: 3.54 ng/dL — ABNORMAL HIGH (ref 0.80–1.80)

## 2015-04-02 LAB — CBC
HCT: 30.6 % — ABNORMAL LOW (ref 39.0–52.0)
Hemoglobin: 9.8 g/dL — ABNORMAL LOW (ref 13.0–17.0)
MCH: 26.8 pg (ref 26.0–34.0)
MCHC: 32 g/dL (ref 30.0–36.0)
MCV: 83.8 fL (ref 78.0–100.0)
PLATELETS: 156 10*3/uL (ref 150–400)
RBC: 3.65 MIL/uL — AB (ref 4.22–5.81)
RDW: 13.6 % (ref 11.5–15.5)
WBC: 6.9 10*3/uL (ref 4.0–10.5)

## 2015-04-02 LAB — BRAIN NATRIURETIC PEPTIDE: B NATRIURETIC PEPTIDE 5: 2635.5 pg/mL — AB (ref 0.0–100.0)

## 2015-04-02 LAB — TROPONIN I: Troponin I: 0.51 ng/mL (ref <0.031)

## 2015-04-02 MED ORDER — FUROSEMIDE 10 MG/ML IJ SOLN
20.0000 mg | Freq: Once | INTRAMUSCULAR | Status: AC
  Administered 2015-04-02: 20 mg via INTRAVENOUS
  Filled 2015-04-02: qty 2

## 2015-04-02 MED ORDER — FUROSEMIDE 20 MG PO TABS: 20.0000 mg | ORAL_TABLET | Freq: Two times a day (BID) | ORAL | Status: DC

## 2015-04-02 MED ORDER — LEVOFLOXACIN 500 MG PO TABS
500.0000 mg | ORAL_TABLET | ORAL | Status: DC
  Administered 2015-04-02: 500 mg via ORAL
  Filled 2015-04-02: qty 1

## 2015-04-02 MED ORDER — LORAZEPAM 2 MG/ML IJ SOLN
0.5000 mg | Freq: Once | INTRAMUSCULAR | Status: AC
  Administered 2015-04-02: 0.5 mg via INTRAVENOUS
  Filled 2015-04-02: qty 1

## 2015-04-02 MED ORDER — PIPERACILLIN-TAZOBACTAM 3.375 G IVPB
3.3750 g | Freq: Three times a day (TID) | INTRAVENOUS | Status: DC
  Administered 2015-04-02 – 2015-04-05 (×8): 3.375 g via INTRAVENOUS
  Filled 2015-04-02 (×10): qty 50

## 2015-04-02 MED ORDER — ALBUTEROL SULFATE (2.5 MG/3ML) 0.083% IN NEBU: 2.5000 mg | INHALATION_SOLUTION | RESPIRATORY_TRACT | Status: AC | PRN

## 2015-04-02 MED ORDER — LEVOFLOXACIN 500 MG PO TABS: 500.0000 mg | ORAL_TABLET | ORAL | Status: DC

## 2015-04-02 NOTE — Progress Notes (Signed)
PATIENT DETAILS Name: Bradley [name] Age: 74 y.o. Sex: male Date of Birth: 1941/06/30 Admit Date: 03/31/2015 Admitting Physician Florencia Reasons, MD JSH:[id number],CHYIF Marigene Ehlers, MD  Subjective: Much improved than on admission. Mental status back to baseline per daughter. However requiring 4 L of oxygen to maintain saturations.  Assessment/Plan: Active Problems:   Acute encephalopathy: Likely secondary to UTI. Suspect some amount of dementia at baseline. Treated with IV Levaquin, mental status significantly improved and at baseline (per daughter).    UTI: Patient presented with confusion, UA was consistent with UTI. He however did not have any fever. Patient was empirically started on levofloxacin,Preliminary culture results show gram-negative rods, however final culture results including sensitivity pending. Initially, patient's family wanted to take him home today, however after much discussion, we have canceled the discharge. Subsequently since patient developed hypoxia, a chest x-ray shows right-sided infiltrates-given history of CVA-some suspicion for aspiration pneumonia, we have now switched him to Zosyn and discontinued levofloxacin.    Acute hypoxic respiratory failure: Unclear whether this is from pneumonia or from unilateral pulmonary edema. Cardiology is following and assisting in management. I will change antibiotics to Zosyn. Continue with supportive care.    ? Suspected aspiration pneumonia: Given history of CVA, and unilateral right-sided infiltrates-some suspicion for aspiration pneumonia. Initially on Levaquin, has now been discontinued, patient now on Zosyn. Continue antibiotics and oxygen as needed. Downgrade to dysphagia 2 diet, feeding with full assistance and will obtain SLP evaluation    ? Acute diastolic heart failure: Continue Lasix, await echocardiogram. Not sure at this time whether unilateral right-sided infiltrates is pulmonary edema or aspiration pneumonia.    Minimally  elevated troponin: Patient without any chest pain or shortness of breath, this is likely secondary to chronic kidney disease. Recent echocardiogram showed preserved ejection fraction without any wall motion abnormality. Cardiology is following. Continue aspirin.  History of severe aortic stenosis: Deferred to cardiology  History of hypertension: Hold Avapro given worsening renal function, continue clonidine, labetalol and hydralazine  History of CVA: Continue aspirin. Speech at baseline is dysarthric. Nonfocal exam, but has some generalized weakness.  Suspected mild dementia: Current mental status close to usual baseline, suspect some mild sundowning intermittently as well. Per patient's daughter, patient is close to his usual baseline   Disposition: Remain inpatient  Antibiotics:  See below   Anti-infectives    Start     Dose/Rate Route Frequency Ordered Stop   04/04/15 0000  levofloxacin (LEVAQUIN) 500 MG tablet     500 mg Oral Every 48 hours 04/02/15 1126     04/02/15 2100  levofloxacin (LEVAQUIN) IVPB 500 mg  Status:  Discontinued     500 mg 100 mL/hr over 60 Minutes Intravenous Every 48 hours 03/31/15 2010 04/02/15 1058   04/02/15 1830  piperacillin-tazobactam (ZOSYN) IVPB 3.375 g     3.375 g 12.5 mL/hr over 240 Minutes Intravenous Every 8 hours 04/02/15 1743     04/02/15 1100  levofloxacin (LEVAQUIN) tablet 500 mg  Status:  Discontinued     500 mg Oral Every 48 hours 04/02/15 1058 04/02/15 1729   03/31/15 1930  levofloxacin (LEVAQUIN) IVPB 750 mg     750 mg 100 mL/hr over 90 Minutes Intravenous  Once 03/31/15 1838 03/31/15 2214   03/31/15 1715  cefTRIAXone (ROCEPHIN) 1 g in dextrose 5 % 50 mL IVPB     1 g 100 mL/hr over 30 Minutes Intravenous  Once 03/31/15 1709 03/31/15 1758   03/31/15 1700  cephALEXin (KEFLEX) capsule 500 mg  Status:  Discontinued     500 mg Oral  Once 03/31/15 1654 03/31/15 1709   03/31/15 0000  cephALEXin (KEFLEX) 500 MG capsule  Status:  Discontinued      500 mg Oral 3 times daily 03/31/15 1657 04/02/15       DVT Prophylaxis: Prophylactic Lovenox   Code Status: Full code   Family Communication Daughter-[name] at bedside  Procedures:  None  CONSULTS:  cardiology  Time spent 40 minutes-which includes 50% of the time with face-to-face with patient/ family and coordinating care related to the above assessment and plan.  MEDICATIONS: Scheduled Meds: . amLODipine  10 mg Oral Daily  . aspirin EC  81 mg Oral Daily  . cloNIDine  0.3 mg Transdermal Weekly  . enoxaparin (LOVENOX) injection  40 mg Subcutaneous Daily  . hydrALAZINE  100 mg Oral Daily  . hydrALAZINE  50 mg Oral QHS  . labetalol  300 mg Oral BID  . piperacillin-tazobactam (ZOSYN)  IV  3.375 g Intravenous Q8H  . potassium chloride  20 mEq Oral Daily  . pravastatin  40 mg Oral Daily  . sodium chloride  3 mL Intravenous Q12H  . tamsulosin  0.4 mg Oral BID   Continuous Infusions:  PRN Meds:.albuterol    PHYSICAL EXAM: Vital signs in last 24 hours: Filed Vitals:   04/02/15 0536 04/02/15 1230 04/02/15 1231 04/02/15 1421  BP: 142/78   137/69  Pulse: 81   79  Temp:    99.4 F (37.4 C)  TempSrc:    Oral  Resp: 20   18  Height:      Weight:      SpO2: 85% 88% 78% 91%    Weight change:  Filed Weights   03/31/15 1907  Weight: 65 kg (143 lb 4.8 oz)   Body mass index is 23.85 kg/(m^2).   Gen Exam: Awake and alert.dysarthric at baseline  Neck: Supple, No JVD.   Chest: bibasilar rales S1 S2 Regular,Systolic murmurs.  Abdomen: soft, BS +, non tender, non distended.  Extremities: no edema, lower extremities warm to touch. Neurologic: Non Focal.   Skin: No Rash.   Wounds: N/A.   Intake/Output from previous day:  Intake/Output Summary (Last 24 hours) at 04/02/15 1848 Last data filed at 04/02/15 1847  Gross per 24 hour  Intake    720 ml  Output   1000 ml  Net   -280 ml     LAB RESULTS: CBC  Recent Labs Lab 03/31/15 1512 04/01/15 0705  04/02/15 0306  WBC 8.9 9.0 6.9  HGB 10.1* 9.8* 9.8*  HCT 31.4* 30.8* 30.6*  PLT 152 156 156  MCV 84.6 85.1 83.8  MCH 27.2 27.1 26.8  MCHC 32.2 31.8 32.0  RDW 13.5 13.5 13.6  LYMPHSABS 1.0  --   --   MONOABS 0.7  --   --   EOSABS 0.0  --   --   BASOSABS 0.0  --   --     Chemistries   Recent Labs Lab 03/31/15 1512 04/01/15 0705 04/02/15 0306  NA 137 137 142  K 3.6 3.4* 4.0  CL 102 104 110  CO2 20 22 24   GLUCOSE 104* 123* 146*  BUN 36* 34* 40*  CREATININE 1.45* 1.51* 1.65*  CALCIUM 9.8 9.6 9.5    CBG: No results for input(s): GLUCAP in the last 168 hours.  GFR Estimated Creatinine Clearance: 34.7 mL/min (by C-G formula based on Cr of 1.65).  Coagulation  profile No results for input(s): INR, PROTIME in the last 168 hours.  Cardiac Enzymes  Recent Labs Lab 04/01/15 1557 04/01/15 2120 04/02/15 0306  TROPONINI 0.19* 0.39* 0.51*    Invalid input(s): POCBNP No results for input(s): DDIMER in the last 72 hours. No results for input(s): HGBA1C in the last 72 hours. No results for input(s): CHOL, HDL, LDLCALC, TRIG, CHOLHDL, LDLDIRECT in the last 72 hours.  Recent Labs  04/01/15 2120  TSH 0.028*   No results for input(s): VITAMINB12, FOLATE, FERRITIN, TIBC, IRON, RETICCTPCT in the last 72 hours. No results for input(s): LIPASE, AMYLASE in the last 72 hours.  Urine Studies No results for input(s): UHGB, CRYS in the last 72 hours.  Invalid input(s): UACOL, UAPR, USPG, UPH, UTP, UGL, UKET, UBIL, UNIT, UROB, ULEU, UEPI, UWBC, URBC, UBAC, CAST, UCOM, BILUA  MICROBIOLOGY: Recent Results (from the past 240 hour(s))  Urine culture     Status: None (Preliminary result)   Collection Time: 03/31/15  3:21 PM  Result Value Ref Range Status   Specimen Description URINE, RANDOM  Final   Special Requests NONE  Final   Colony Count   Final    >=100,000 COLONIES/ML Performed at [facility] Performed at [employer] [facility]    Report Status PENDING  Incomplete    RADIOLOGY STUDIES/RESULTS: Dg Chest Port 1 View  04/02/2015   CLINICAL DATA:  Congestive heart failure.  Urinary tract infection.  EXAM: PORTABLE CHEST - 1 VIEW  COMPARISON:  03/31/2015  FINDINGS: New asymmetric airspace disease is seen throughout the right lung, and perhaps to a lesser degree in the retrocardiac left lower lobe. No definite pleural effusion seen on this portable exam. Cardiomegaly is stable.  IMPRESSION: New asymmetric airspace disease, which may be due to pneumonia or asymmetric pulmonary edema.  Stable cardiomegaly.   Electronically Signed   By: Earle Gell M.D.   On: 04/02/2015 14:05   Dg Chest Port 1 View  03/31/2015   CLINICAL DATA:  Fatigue  EXAM: PORTABLE CHEST - 1 VIEW  COMPARISON:  11/16/2014  FINDINGS: Cardiac shadow is enlarged. Aortic calcifications are again noted. No focal infiltrate or sizable effusion is seen. No bony abnormality is noted.  IMPRESSION: No active disease.   Electronically Signed   By: Inez Catalina M.D.   On: 03/31/2015 15:56    Oren Binet, MD  [facility] Pager:[phone]  If 7PM-7AM, please contact night-coverage [email or web address] Password [email or web address] 04/02/2015, 6:48 PM   LOS: 2 days

## 2015-04-02 NOTE — Progress Notes (Signed)
ANTIBIOTIC CONSULT NOTE - INITIAL  Pharmacy Consult for Zosyn Indication: Empiric aspiration PNA and UTI coverage  No Known Allergies  Patient Measurements: Height: 5\' 5"  (165.1 cm) Weight: 143 lb 4.8 oz (65 kg) IBW/kg (Calculated) : 61.5  Vital Signs: Temp: 99.4 F (37.4 C) (04/04 1421) Temp Source: Oral (04/04 1421) BP: 137/69 mmHg (04/04 1421) Pulse Rate: 79 (04/04 1421) Intake/Output from previous day: 04/03 0701 - 04/04 0700 In: 890 [P.O.:840; IV Piggyback:50] Out: 1000 [Urine:1000] Intake/Output from this shift: Total I/O In: 120 [P.O.:120] Out: 200 [Urine:200]  Labs:  Recent Labs  03/31/15 1512 04/01/15 0705 04/02/15 0306  WBC 8.9 9.0 6.9  HGB 10.1* 9.8* 9.8*  PLT 152 156 156  CREATININE 1.45* 1.51* 1.65*   Estimated Creatinine Clearance: 34.7 mL/min (by C-G formula based on Cr of 1.65). No results for input(s): VANCOTROUGH, VANCOPEAK, VANCORANDOM, GENTTROUGH, GENTPEAK, GENTRANDOM, TOBRATROUGH, TOBRAPEAK, TOBRARND, AMIKACINPEAK, AMIKACINTROU, AMIKACIN in the last 72 hours.   Microbiology: Recent Results (from the past 720 hour(s))  Urine culture     Status: None (Preliminary result)   Collection Time: 03/31/15  3:21 PM  Result Value Ref Range Status   Specimen Description URINE, RANDOM  Final   Special Requests NONE  Final   Colony Count   Final    >=100,000 COLONIES/ML Performed at [facility]    Culture   Final    Luke Performed at [facility]    Report Status PENDING  Incomplete    Medical History: Past Medical History  Diagnosis Date  . Stroke   . Hypertension   . Hyperlipidemia   . Aortic stenosis   . CHF (congestive heart failure)   . MGUS (monoclonal gammopathy of unknown significance)   . Goiter   . Rhabdomyolysis   . UTI (urinary tract infection) 05/09/2014    Assessment: 44 YOM who was intended to be discharged today however was noted to have drops in O2 sats and difficulty breathing. Pharmacy  was consulted to dose Zosyn for r/o aspiration PNA and also for continued coverage for the patient's UTI.   Goal of Therapy:  Proper antibiotics for infection/cultures adjusted for renal/hepatic function   Plan:  1. Start Zosyn 3.375g IV every 8 hours 2. Will continue to follow renal function, culture results, LOT, and antibiotic de-escalation plans   Alycia Rossetti, PharmD, BCPS Clinical Pharmacist Pager: [phone] 04/02/2015 5:45 PM

## 2015-04-02 NOTE — Clinical Documentation Improvement (Signed)
Please specify diagnosis related to below supporting information, if appropriate.   Possible Clinical Conditions? Chronic Systolic Congestive Heart Failure Chronic Diastolic Congestive Heart Failure Chronic Systolic & Diastolic Congestive Heart Failure Acute Systolic Congestive Heart Failure Acute Diastolic Congestive Heart Failure Acute Systolic & Diastolic Congestive Heart Failure Acute on Chronic Systolic Congestive Heart Failure Acute on Chronic Diastolic Congestive Heart Failure Acute on Chronic Systolic & Diastolic Congestive Heart Failure Other Condition________________________________________ Cannot Clinically Determine  Supporting Information:   Consult Note 04/01/2015   HPI:  CHF (congestive heart failure)   PROGRESS 04/01/2015   Assessment/Plan:  74 y/o male with PMH of HTN, CHF, CKD, Aortic stenosis, CVA with residual right-sided deficits, h/o UTIs presented with confusion, generalized weakness  Mild elevation of troponin, no chest pain, no acute EKG changes, likely troponin leak in the setting of UTI/CDK and CHF.  CHF, chronic likely due to severe AS; echo: LVEF 55-60%,LVH, severe AS with mean gradient at 58 mmHg;   H&P 03/31/2015   Review of Systems:  CHF (congestive heart failure)   PROGRESS 03/31/2015   Medical History:  CHF (congestive heart failure)   ED Prov Note 03/31/2015   HPI Comments:  CHF (congestive heart failure)    Thank You, Serena Colonel ,RN Clinical Documentation Specialist:  [facility]

## 2015-04-02 NOTE — Progress Notes (Signed)
SATURATION QUALIFICATIONS: (This note is used to comply with regulatory documentation for home oxygen)  Patient Saturations on Room Air at Rest = 72%  Patient Saturations on Room Air while Ambulating = 75%  Patient Saturations on 6 then 8 Liters of oxygen while Ambulating = 86%  Please briefly explain why patient needs home oxygen:  Supplement oxygen is assisting getting patients numbers closer to adequate saturation levels 04/02/2015  Donnella Sham, [place] [phone]  (pager)

## 2015-04-02 NOTE — Progress Notes (Signed)
SATURATION QUALIFICATIONS: (This note is used to comply with regulatory documentation for home oxygen)  Patient Saturations on Room Air at Rest = 78% on RA  Patient Saturations on 4.5L of O2 at Rest= 88%

## 2015-04-02 NOTE — Progress Notes (Signed)
Subjective:  No chest pain. He denies dyspnea, but speaks in interrupted sentences. On O2 supplementation, O2 sat still low  Objective:  Vital Signs in the last 24 hours: Temp:  [99.4 F (37.4 C)-99.8 F (37.7 C)] 99.4 F (37.4 C) (04/04 1421) Pulse Rate:  [79-90] 79 (04/04 1421) Resp:  [18-20] 18 (04/04 1421) BP: (137-152)/(69-86) 137/69 mmHg (04/04 1421) SpO2:  [78 %-92 %] 91 % (04/04 1421)  Intake/Output from previous day:  Intake/Output Summary (Last 24 hours) at 04/02/15 1540 Last data filed at 04/02/15 1450  Gross per 24 hour  Intake    410 ml  Output    600 ml  Net   -190 ml    Physical Exam:  General: Alert, oriented x3, no distress Head: no evidence of trauma, PERRL, EOMI, no exophtalmos or lid lag, no myxedema, no xanthelasma; normal ears, nose and oropharynx Neck: 6-6 cm jugular venous pulsations and prompt hepatojugular reflux;  carotid pulses with mild delay and bilateral carotid bruits Chest: clear to auscultation, no signs of consolidation by percussion or palpation, normal fremitus, symmetrical and full respiratory excursions Cardiovascular: normal position and quality of the apical impulse, regular rhythm, normal first and second heart sounds, no  rubs or gallops. Mid to late peaking 3/6 aortic ejection murmur Abdomen: no tenderness or distention, no masses by palpation, no abnormal pulsatility or arterial bruits, normal bowel sounds, no hepatosplenomegaly Extremities: no clubbing, cyanosis or edema; 2+ radial, ulnar and brachial pulses bilaterally; 2+ right femoral, posterior tibial and dorsalis pedis pulses; 2+ left femoral, posterior tibial and dorsalis pedis pulses; no subclavian or femoral bruits Neurological: grossly nonfocal except dysarthria     Rate: 78  Rhythm: normal sinus rhythm and 1st degree AVB  Lab Results:  Recent Labs  04/01/15 0705 04/02/15 0306  WBC 9.0 6.9  HGB 9.8* 9.8*  PLT 156 156    Recent Labs  04/01/15 0705  04/02/15 0306  NA 137 142  K 3.4* 4.0  CL 104 110  CO2 22 24  GLUCOSE 123* 146*  BUN 34* 40*  CREATININE 1.51* 1.65*    Recent Labs  04/01/15 2120 04/02/15 0306  TROPONINI 0.39* 0.51*   No results for input(s): INR in the last 72 hours.  Imaging: Imaging results have been reviewed  Cardiac Studies:  Assessment/Plan:   Active Problems:   Troponin level elevated   Moderate aortic stenosis   Hypertension   UTI (lower urinary tract infection)   Chronic renal insufficiency, stage III (moderate)   Anemia   Abnormal TSH   Diastolic dysfunction, left ventricle   H/O   Dyslipidemia   PLAN: Will defer abnormal TSH work up to PCP. MD to see- probably OP Myoview and f/u with Dr Harrington Challenger.  Kerin Ransom PA-C Beeper [phone] 04/02/2015, 3:40 PM  I have seen and examined the patient along with Kerin Ransom PA-C.  I have reviewed the chart, notes and new data.  I agree with PA/NP's note.  Key new complaints: "I want to go home, I'm feeling good" (but unable to say whole sentence without interrupting for a breath Key examination changes: loud AS murmur is mid-to late peaking and carotids are delayed, c/w severe AS Key new findings / data: minimal increase in troponin, c/w Demand ischemia Suspect acute diastolic HF - this may explain his new CXR abnormalities  PLAN: Don't think he has ACS. Dose again with diuretics today -caution with severe AS May be approaching time for mechanical intervention for AS. He is  not a candidate for surgical AVR and has some concerns for TAVR as well.  Sanda Klein, MD, [place] [phone] 04/02/2015, 4:14 PM

## 2015-04-02 NOTE — Discharge Summary (Signed)
PATIENT DETAILS Name: Bradley [name] Age: 74 y.o. Sex: male Date of Birth: 1941/04/05 MRN: [id number]. Admitting Physician: Florencia Reasons, MD LXB:WIOMBT,DHRCB Bradley Ehlers, MD  Admit Date: 03/31/2015 Discharge date: 04/02/2015  Recommendations for Outpatient Follow-up:  1. Please follow urine culture results-pending at the time of discharge-on empiric Levaquin 2. Please check chemistries at next follow-up-creatinine slightly increased in usual baseline 3. Please ensure patient has follow-up with Dr. Harrington [name] (cardiology) and Dr. Mercy [name] (nephrology) in the next few weeks  PRIMARY DISCHARGE DIAGNOSIS:  Active Problems:   UTI (lower urinary tract infection)   Acute encephalopathy   Mild troponin elevation   Acute on chronic kidney disease stage III      PAST MEDICAL HISTORY: Past Medical History  Diagnosis Date  . Stroke   . Hypertension   . Hyperlipidemia   . Aortic stenosis   . CHF (congestive heart failure)   . MGUS (monoclonal gammopathy of unknown significance)   . Goiter   . Rhabdomyolysis   . UTI (urinary tract infection) 05/09/2014    DISCHARGE MEDICATIONS: Current Discharge Medication List    START taking these medications   Details  albuterol (PROVENTIL) (2.5 MG/3ML) 0.083% nebulizer solution Take 3 mLs (2.5 mg total) by nebulization every 4 (four) hours as needed for wheezing or shortness of breath. Qty: 75 mL, Refills: 0    levofloxacin (LEVAQUIN) 500 MG tablet Take 1 tablet (500 mg total) by mouth every other day. Start on 4/6 Qty: 3 tablet, Refills: 0      CONTINUE these medications which have CHANGED   Details  furosemide (LASIX) 20 MG tablet Take 1 tablet (20 mg total) by mouth 2 (two) times daily. Start on 4/5-but make sure that your Primary MD checks a Renal function panel in next 2-3 days. Qty: 30 tablet, Refills: 0      CONTINUE these medications which have NOT CHANGED   Details  amLODipine (NORVASC) 10 MG tablet Take 10 mg by mouth daily.    aspirin 81 MG  tablet Take 81 mg by mouth daily.    cloNIDine (CATAPRES - DOSED IN MG/24 HR) 0.3 mg/24hr Place 1 patch onto the skin once a week. Sundays    hydrALAZINE (APRESOLINE) 50 MG tablet Take 50-100 mg by mouth 2 (two) times daily. 100 mg in the morning and 50 mg in the evening    labetalol (NORMODYNE) 300 MG tablet Take 1 tablet (300 mg total) by mouth 2 (two) times daily.    pravastatin (PRAVACHOL) 40 MG tablet Take 40 mg by mouth daily. Refills: 1    Tamsulosin HCl (FLOMAX) 0.4 MG CAPS Take 0.4 mg by mouth 2 (two) times daily.       STOP taking these medications     irbesartan (AVAPRO) 300 MG tablet      ciprofloxacin (CIPRO) 500 MG tablet      dextromethorphan-guaiFENesin (MUCINEX DM) 30-600 MG per 12 hr tablet         ALLERGIES:  No Known Allergies  BRIEF HPI:  See H&P, Labs, Consult and Test reports for all details in brief, patient is a 74 year old male with history of stroke with residual right-sided deficits who presented to the hospital on 4/2 with worsening confusion. Patient was found to have a UTI and admitted for further evaluation and treatment  CONSULTATIONS:   cardiology  PERTINENT RADIOLOGIC STUDIES: Dg Chest Port 1 View  03/31/2015   CLINICAL DATA:  Fatigue  EXAM: PORTABLE CHEST - 1 VIEW  COMPARISON:  11/16/2014  FINDINGS:  Cardiac shadow is enlarged. Aortic calcifications are again noted. No focal infiltrate or sizable effusion is seen. No bony abnormality is noted.  IMPRESSION: No active disease.   Electronically Signed   By: Inez Catalina M.D.   On: 03/31/2015 15:56     PERTINENT LAB RESULTS: CBC:  Recent Labs  04/01/15 0705 04/02/15 0306  WBC 9.0 6.9  HGB 9.8* 9.8*  HCT 30.8* 30.6*  PLT 156 156   CMET CMP     Component Value Date/Time   NA 142 04/02/2015 0306   K 4.0 04/02/2015 0306   CL 110 04/02/2015 0306   CO2 24 04/02/2015 0306   GLUCOSE 146* 04/02/2015 0306   BUN 40* 04/02/2015 0306   CREATININE 1.65* 04/02/2015 0306   CALCIUM 9.5  04/02/2015 0306   PROT 7.4 04/01/2015 0705   ALBUMIN 3.1* 04/01/2015 0705   AST 22 04/01/2015 0705   ALT 23 04/01/2015 0705   ALKPHOS 79 04/01/2015 0705   BILITOT 0.7 04/01/2015 0705   GFRNONAA 40* 04/02/2015 0306   GFRAA 46* 04/02/2015 0306    GFR Estimated Creatinine Clearance: 34.7 mL/min (by C-G formula based on Cr of 1.65). No results for input(s): LIPASE, AMYLASE in the last 72 hours.  Recent Labs  04/01/15 1557 04/01/15 2120 04/02/15 0306  TROPONINI 0.19* 0.39* 0.51*   Invalid input(s): POCBNP No results for input(s): DDIMER in the last 72 hours. No results for input(s): HGBA1C in the last 72 hours. No results for input(s): CHOL, HDL, LDLCALC, TRIG, CHOLHDL, LDLDIRECT in the last 72 hours.  Recent Labs  04/01/15 2120  TSH 0.028*   No results for input(s): VITAMINB12, FOLATE, FERRITIN, TIBC, IRON, RETICCTPCT in the last 72 hours. Coags: No results for input(s): INR in the last 72 hours.  Invalid input(s): PT Microbiology: Recent Results (from the past 240 hour(s))  Urine culture     Status: None (Preliminary result)   Collection Time: 03/31/15  3:21 PM  Result Value Ref Range Status   Specimen Description URINE, RANDOM  Final   Special Requests NONE  Final   [place] Count   Final    >=100,000 COLONIES/ML Performed at [facility]    Culture   Final    [place] Performed at [facility]    Report Status PENDING  Incomplete     BRIEF HOSPITAL COURSE:  Acute encephalopathy: Likely secondary to UTI. Suspect some amount of dementia at baseline. Treated with IV Levaquin, mental status significantly improved and at baseline (per daughter) at the time of discharge.  UTI: Patient presented with confusion, UA was consistent with UTI. He however did not have any fever. Patient was empirically started on levofloxacin, urine cultures were pain. Preliminary culture results show gram-negative rods, however final culture results including  sensitivity pending.During my morning rounds, patient's daughter and healthcare power of attorney-Bradley [name] requested discharge. She was fully aware that urine culture results were pending, and came to this M.D. that since patient has intermittent episodes of confusion, she thought that patient would best benefit from being back to his familiar surroundings. She did indicate that patient is significantly improved and on initial presentation. She also indicated that patient has 24/7 help at home. She will make a follow-up appointment with patient's primary M.D. in the next 1-2 days to follow on urine culture results  Elevated troponin: Without any chest pain or shortness of breath. Likely secondary to chronic kidney disease. Seen by cardiology, recommendations were for supportive care and to continue  to trend troponins. Patient recently had a echocardiogram which showed preserved ejection fraction and did confirm severe aortic stenosis.as noted above, patient's daughter and health Of attorney is insisting on discharge today, we will defer further workup and monitoring to the outpatient setting. Patient's daughter indicates that she will make appointment with patient's primary cardiologist Dr. Harrington [name] in the next 1-2 weeks.  Mild acute on chronic kidney disease stage III: Patient's creatinine during this hospitalization has slowly increased, at day of discharge it is up to 1.65. As noted above, patient's daughter is requesting discharge, she is aware of worsening of renal function. We will hold Lasix today, and resume at 20 mg twice a day from tomorrow onwards. I have asked the patient's daughter to make sure patient gets a repeat renal function panel at patient's PCP office in the next 1-2 days for further adjustment of diuretics. We will continue to hold Avapro on discharge.  History of severe aortic stenosis: We will defer further workup/monitoring/treatment to the outpatient setting. Patient is being  discharged at family's request  History of hypertension: We'll continue to hold Avapro, continue clonidine, labetalol and hydralazine. See above regarding Lasix  History of CVA: Continue aspirin. Speech at baseline is dysarthric. Nonfocal exam, but has some generalized weakness.  Suspected mild dementia: Current mental status close to usual baseline, suspect some mild sundowning intermittently as well. Per patient's daughter, patient is close to his usual baseline at time of discharge.  Chronic diastolic heart failure: Appears compensated on my exam. See above regarding diuretic dosing.  TODAY-DAY OF DISCHARGE:  Subjective:   Bradley [name] today has no headache,no chest abdominal pain,no new weakness tingling or numbness, feels much better wants to go home today.   Objective:   Blood pressure 142/78, pulse 81, temperature 99.8 F (37.7 C), temperature source Oral, resp. rate 20, height 5\' 5"  (1.651 m), weight 65 kg (143 lb 4.8 oz), SpO2 85 %.  Intake/Output Summary (Last 24 hours) at 04/02/15 1129 Last data filed at 04/01/15 2100  Gross per 24 hour  Intake    530 ml  Output    400 ml  Net    130 ml   Filed Weights   03/31/15 1907  Weight: 65 kg (143 lb 4.8 oz)    Exam Awake Alert, Oriented *3, No new F.N deficits, Normal affect [place].AT,PERRAL Supple Neck,No JVD, No cervical lymphadenopathy appriciated.  Symmetrical Chest wall movement, Good air movement bilaterally, CTAB RRR,No Gallops,Rubs or new Murmurs, No Parasternal Heave +ve B.Sounds, Abd Soft, Non tender, No organomegaly appriciated, No rebound -guarding or rigidity. No Cyanosis, Clubbing or edema, No new Rash or bruise  DISCHARGE CONDITION: Stable  DISPOSITION: Home with home health services  DISCHARGE INSTRUCTIONS:    Activity:  As tolerated with Full fall precautions use walker/cane & assistance as needed  Diet recommendation: Heart Healthy diet  Discharge Instructions    Call MD for:  difficulty  breathing, headache or visual disturbances    Complete by:  As directed      Call MD for:  extreme fatigue    Complete by:  As directed      Call MD for:  persistant dizziness or light-headedness    Complete by:  As directed      Call MD for:  temperature >100.4    Complete by:  As directed      Diet - low sodium heart healthy    Complete by:  As directed      Increase activity slowly  Complete by:  As directed            Follow-up Information    Follow up with [name], MD. Schedule an appointment as soon as possible for a visit in 2 days.   Specialty:  Family Medicine   Why:  please ask your primary M.D. to follow urine culture results, and also to check a chemistry panel to monitor renal function   Contact information:   [place] [address] [phone]       Follow up with Dorris Carnes, MD. Schedule an appointment as soon as possible for a visit in 1 week.   Specialty:  Cardiology   Contact information:   [place] [address] [phone]       Follow up with [facility], MD On 04/13/2015.   Specialty:  Endocrinology   Why:  Keep existing appointment   Contact information:   [place] [address] [phone]       Follow up with MATTINGLY,MICHAEL T, MD. Schedule an appointment as soon as possible for a visit in 2 weeks.   Specialty:  Nephrology   Contact information:   [place] [address] [phone]         Total Time spent on discharge equals 45 minutes.  SignedOren Binet 04/02/2015 11:29 AM

## 2015-04-02 NOTE — Progress Notes (Signed)
Patient was seen and examined today, daughter Particia Nearing was at bedside. Patient is awake and alert, per daughter he is at baseline now. Daughter is requesting discharge, she is aware that urine cs results are not yet back and that creatinine is slowly rising. She claims that patient has several outpatient appointments pending over the next few weeks. She is aware that the patient will benefit from a few more days of hospitalizations and that is my recommendation as well. But per her, patient gets somewhat confused while in the hospital and thinks that patient will benefit from being back in his familiar surroundings. She will make appt with PCP in next few days to follow urine cs and check chemistry, she accepts all risks of early discharge without optimization of medical issues-including more disability and death.See discharge summary for further details.

## 2015-04-02 NOTE — Progress Notes (Signed)
CRITICAL VALUE ALERT  Critical value received:  Troponin 0.51   Date of notification:  04/02/15  Time of notification:  2641  Critical value read back:Yes.    Nurse who received alert:  Amaryllis Dyke  MD notified (1st page):  Rogue Bussing, NP  Time of first page:  0435  MD notified (2nd page): Alejandro Mulling MD  Time of second [id number]  Responding MD:  Claiborne Billings, MD  Time MD responded:  [phone]

## 2015-04-02 NOTE — Progress Notes (Signed)
Patient to be discharged once PT sees him today, physical therapy has been called and is aware per MD Ghimire's request.

## 2015-04-02 NOTE — Evaluation (Signed)
Physical Therapy Evaluation Patient Details Name: Bradley [name] MRN: [id number] DOB: 11-11-1941 Today's Date: 04/02/2015   History of Present Illness  74 year old male with history of stroke with residual right-sided deficits who presents with difficulty ambulating due to right leg weakness and confusion. Patient currently not able to provide history due to confusion, not oriented to time,place, not able to recall president's name. HPI obtained from talking to [name] and His daughter who is the main care giver. Daughter reported patient lives by self,amublate with walker, normally AAOX3, but when he gets uti he will get confused and weak, with more prominent right sided weakness, this morning daughter noticed the same, brought patient to the ED. Patient denis fever/abdominal pain,/nausea,/vomiting,/chest pain,/shortness of breath,/headaches/ dizziness/denies any urinary symptoms.  Clinical Impression  Pt admitted with/for AMS, UTI and respiratory complications.  Pt currently limited functionally due to the problems listed. ( See problems list.)   Pt will benefit from PT to maximize function and safety in order to get ready for next venue listed below.     Follow Up Recommendations SNF    Equipment Recommendations  None recommended by PT;Other (comment) (TBA)    Recommendations for Other Services       Precautions / Restrictions Precautions Precautions: Fall      Mobility  Bed Mobility Overal bed mobility: Needs Assistance Bed Mobility: Supine to Sit;Sit to Supine     Supine to sit: Mod assist Sit to supine: Mod assist   General bed mobility comments: very unccordinated and effortful transitions in bed  Transfers Overall transfer level: Needs assistance   Transfers: Sit to/from Stand;Stand Pivot Transfers Sit to Stand: Mod assist Stand pivot transfers: Mod assist       General transfer comment: uncoordinated transitions, needing lots of help to come forward more than  lift.  Ambulation/Gait Ambulation/Gait assistance: Mod assist;Max assist;+2 safety/equipment Ambulation Distance (Feet): 8 Feet (x2 with RW and significant stability assist) Assistive device: Rolling walker (2 wheeled) Gait Pattern/deviations: Step-to pattern;Decreased step length - left;Decreased step length - right;Decreased stance time - right;Decreased stride length   Gait velocity interpretation: Below normal speed for age/gender General Gait Details: hemiparetic gait on R with little w/shift onto R side.  Flexed and rigid posturing  Stairs            Wheelchair Mobility    Modified Rankin (Stroke Patients Only)       Balance Overall balance assessment: Needs assistance Sitting-balance support: Single extremity supported;Bilateral upper extremity supported Sitting balance-Leahy Scale: Poor Sitting balance - Comments: generally tended to fall posteriorly.  Held balance for seconds at a time before falling post.     Standing balance-Leahy Scale: Poor                               Pertinent Vitals/Pain      Home Living Family/patient expects to be discharged to:: Private residence Living Arrangements: Alone Available Help at Discharge: Family;Available PRN/intermittently Type of Home: Apartment Home Access: Stairs to enter Entrance Stairs-Rails: Right;Left;Can reach both Entrance Stairs-Number of Steps: 16 Home Layout: One level Home Equipment: Walker - 2 wheels;Bedside commode      Prior Function Level of Independence: Independent with assistive device(s)         Comments: assist for bringing walker up or down stairs when leaving or arriving at apartment.  Daughter assists with meals and homemaking     Hand Dominance  Extremity/Trunk Assessment   Upper Extremity Assessment: Defer to OT evaluation           Lower Extremity Assessment: Generalized weakness;RLE deficits/detail;LLE deficits/detail RLE Deficits / Details:  grossly 4-/5 and decr fine motor control LLE Deficits / Details: hemiparetic and moves in synergy, little ability to isolate movement  Cervical / Trunk Assessment: Kyphotic  Communication   Communication: No difficulties  Cognition Arousal/Alertness: Awake/alert;Lethargic Behavior During Therapy: WFL for tasks assessed/performed;Anxious Overall Cognitive Status: Impaired/Different from baseline Area of Impairment: Following commands;Awareness;Problem solving       Following Commands: Follows one step commands inconsistently;Follows one step commands with increased time   Awareness: Intellectual Problem Solving: Slow processing;Decreased initiation;Difficulty sequencing;Requires verbal cues      General Comments General comments (skin integrity, edema, etc.): SpO2 on RA 72% and HR 89bpm.  Ambulation on RA at 75% and 91 bpm,  Ambulation on 6-8 L at 86% and 91 bpm.      Exercises        Assessment/Plan    PT Assessment Patient needs continued PT services  PT Diagnosis Difficulty walking;Generalized weakness   PT Problem List Decreased strength;Decreased activity tolerance;Decreased balance;Decreased mobility;Decreased coordination;Decreased cognition;Decreased knowledge of use of DME;Decreased safety awareness;Cardiopulmonary status limiting activity  PT Treatment Interventions DME instruction;Gait training;Functional mobility training;Therapeutic activities;Balance training;Patient/family education   PT Goals (Current goals can be found in the Care Plan section) Acute Rehab PT Goals Patient Stated Goal: Eventually get back home PT Goal Formulation: With patient/family Time For Goal Achievement: 04/16/15 Potential to Achieve Goals: Good    Frequency Min 3X/week   Barriers to discharge        Co-evaluation               End of Session Equipment Utilized During Treatment: Oxygen Activity Tolerance: Patient tolerated treatment well;Patient limited by fatigue Patient  left: in bed;with call bell/phone within reach;with family/visitor present Nurse Communication: Mobility status         Time: 2694-8546 PT Time Calculation (min) (ACUTE ONLY): 51 min   Charges:   PT Evaluation $Initial PT Evaluation Tier I: 1 Procedure PT Treatments $Therapeutic Activity: 23-37 mins   PT G Codes:        Azra Abrell, Tessie Fass 04/02/2015, 1:49 PM 04/02/2015  Donnella Sham, [place] [phone]  (pager)

## 2015-04-03 ENCOUNTER — Inpatient Hospital Stay (HOSPITAL_COMMUNITY): Payer: Medicare Other

## 2015-04-03 DIAGNOSIS — J690 Pneumonitis due to inhalation of food and vomit: Secondary | ICD-10-CM

## 2015-04-03 DIAGNOSIS — I5033 Acute on chronic diastolic (congestive) heart failure: Secondary | ICD-10-CM

## 2015-04-03 DIAGNOSIS — I350 Nonrheumatic aortic (valve) stenosis: Secondary | ICD-10-CM

## 2015-04-03 LAB — CBC
HCT: 29.6 % — ABNORMAL LOW (ref 39.0–52.0)
Hemoglobin: 9.6 g/dL — ABNORMAL LOW (ref 13.0–17.0)
MCH: 27.1 pg (ref 26.0–34.0)
MCHC: 32.4 g/dL (ref 30.0–36.0)
MCV: 83.6 fL (ref 78.0–100.0)
PLATELETS: 163 10*3/uL (ref 150–400)
RBC: 3.54 MIL/uL — ABNORMAL LOW (ref 4.22–5.81)
RDW: 13.5 % (ref 11.5–15.5)
WBC: 6.6 10*3/uL (ref 4.0–10.5)

## 2015-04-03 LAB — URINE CULTURE: Colony Count: >=100000

## 2015-04-03 LAB — BASIC METABOLIC PANEL
ANION GAP: 9 (ref 5–15)
BUN: 55 mg/dL — ABNORMAL HIGH (ref 6–23)
CHLORIDE: 111 mmol/L (ref 96–112)
CO2: 23 mmol/L (ref 19–32)
Calcium: 9.7 mg/dL (ref 8.4–10.5)
Creatinine, Ser: 1.93 mg/dL — ABNORMAL HIGH (ref 0.50–1.35)
GFR calc Af Amer: 38 mL/min — ABNORMAL LOW (ref >90)
GFR calc non Af Amer: 33 mL/min — ABNORMAL LOW (ref >90)
GLUCOSE: 123 mg/dL — AB (ref 70–99)
Potassium: 4.5 mmol/L (ref 3.5–5.1)
SODIUM: 143 mmol/L (ref 135–145)

## 2015-04-03 MED ORDER — RESOURCE THICKENUP CLEAR PO POWD
ORAL | Status: DC | PRN
Start: 2015-04-03 — End: 2015-04-05
  Filled 2015-04-03: qty 125

## 2015-04-03 MED ORDER — IPRATROPIUM-ALBUTEROL 0.5-2.5 (3) MG/3ML IN SOLN
3.0000 mL | Freq: Three times a day (TID) | RESPIRATORY_TRACT | Status: DC
  Administered 2015-04-03 – 2015-04-04 (×3): 3 mL via RESPIRATORY_TRACT
  Filled 2015-04-03 (×3): qty 3

## 2015-04-03 MED ORDER — GUAIFENESIN ER 600 MG PO TB12
600.0000 mg | ORAL_TABLET | Freq: Two times a day (BID) | ORAL | Status: DC
  Administered 2015-04-03 – 2015-04-05 (×4): 600 mg via ORAL
  Filled 2015-04-03 (×5): qty 1

## 2015-04-03 MED ORDER — ALBUTEROL SULFATE (2.5 MG/3ML) 0.083% IN NEBU: 2.5000 mg | INHALATION_SOLUTION | RESPIRATORY_TRACT | Status: DC | PRN

## 2015-04-03 NOTE — Clinical Social Work Psychosocial (Signed)
Clinical Social Work Department BRIEF PSYCHOSOCIAL ASSESSMENT 04/03/2015  Patient:  Bradley [name], Bradley [name]     Account Number:  [id number]     Admit date:  03/31/2015  Clinical Social Worker:  Kingsley Spittle, CLINICAL SOCIAL WORKER  Date/Time:  04/03/2015 04:22 PM  Referred by:  Physician  Date Referred:  04/03/2015 Referred for  SNF Placement   Other Referral:   N/A   Interview type:  Family Other interview type:   BSW intern spoke with patient's daughter Bradley [name].    PSYCHOSOCIAL DATA Living Status:  ALONE Admitted from facility:   Level of care:   Primary support name:  Bradley [name] Primary support relationship to patient:  CHILD, ADULT Degree of support available:   Support is good.    CURRENT CONCERNS Current Concerns  Post-Acute Placement   Other Concerns:   N/A    SOCIAL WORK ASSESSMENT / PLAN BSW intern spoke with patient's daughter via [name] about his plains for discharge. Patient's daughter seemed very impatient when discussing her fathers plan for discharge. She was agreeable in sending her father to SNF and preferred [facility]. Patient's daughter did not have any questions about the discharge process her only concern was when he would be discharged.   Assessment/plan status:  Psychosocial Support/Ongoing Assessment of Needs Other assessment/ plan:   N/A   Information/referral to community resources:   CSW contact information given to patient's daughter.    PATIENT'S/FAMILY'S RESPONSE TO PLAN OF CARE: Patient's daughter was agreeable to SNF and would prefer [facility]. Ongoing assessment may be needed.     Kingsley Spittle, [place] Intern, [phone]

## 2015-04-03 NOTE — Progress Notes (Signed)
    Subjective:  Up in chair. He is on O2 but looks comfortable.  Objective:  Vital Signs in the last 24 hours: Temp:  [98.2 F (36.8 C)-98.7 F (37.1 C)] 98.2 F (36.8 C) (04/05 1437) Pulse Rate:  [64-77] 64 (04/05 1437) Resp:  [18] 18 (04/05 0512) BP: (120-127)/(53-75) 127/57 mmHg (04/05 1437) SpO2:  [91 %-99 %] 99 % (04/05 1437)  Intake/Output from previous day:  Intake/Output Summary (Last 24 hours) at 04/03/15 1512 Last data filed at 04/03/15 0601  Gross per 24 hour  Intake    700 ml  Output    800 ml  Net   -100 ml    Physical Exam: General appearance: alert, cooperative and no distress Neck: large goiter noted Lungs: clear to auscultation bilaterally Heart: regular rate and rhythm and 0/3/ systolic murmur Extremities: trace- 1+ edema   Rate: 66  Rhythm: normal sinus rhythm and 1st degree AVB  Lab Results:  Recent Labs  04/02/15 0306 04/03/15 0646  WBC 6.9 6.6  HGB 9.8* 9.6*  PLT 156 163    Recent Labs  04/02/15 0306 04/03/15 0646  NA 142 143  K 4.0 4.5  CL 110 111  CO2 24 23  GLUCOSE 146* 123*  BUN 40* 55*  CREATININE 1.65* 1.93*    Recent Labs  04/01/15 2120 04/02/15 0306  TROPONINI 0.39* 0.51*   No results for input(s): INR in the last 72 hours.  Imaging: Imaging results have been reviewed  Cardiac Studies:  Assessment/Plan:   Principal Problem:   Acute on chronic diastolic congestive heart failure Active Problems:   Severe aortic stenosis   Hypertension   UTI (lower urinary tract infection)   Chronic renal insufficiency, stage III (moderate)   Anemia-MGUS   Acute on chronic renal insufficiency   Demand ischemia   Abnormal TSH   Diastolic dysfunction, left ventricle   Dyslipidemia   PLAN: Swallowing study pending (? Of aspiration pneumonia). He feels better after one dose of Lasix 20 mg yesterday but his SCr is up to 1.9 today. I spoke with pt's family. Dr Sallyanne Kuster to review in am.   Kerin Ransom PA-C [place]  [phone] 04/03/2015, 3:12 PM

## 2015-04-03 NOTE — Clinical Social Work Placement (Signed)
Clinical Social Work Department CLINICAL SOCIAL WORK PLACEMENT NOTE 04/03/2015  Patient:  Bradley [name], Bradley [name]  Account Number:  [id number] Admit date:  03/31/2015  Clinical Social Worker:  Kingsley Spittle, CLINICAL SOCIAL WORKER  Date/time:  04/03/2015 04:32 PM  Clinical Social Work is seeking post-discharge placement for this patient at the following level of care:   SKILLED NURSING   (*CSW will update this form in Epic as items are completed)   04/03/2015  Patient/family provided with [place] Department of Clinical Social Work's list of facilities offering this level of care within the geographic area requested by the patient (or if unable, by the patient's family).  04/03/2015  Patient/family informed of their freedom to choose among providers that offer the needed level of care, that participate in Medicare, Medicaid or managed care program needed by the patient, have an available bed and are willing to accept the patient.  04/03/2015  Patient/family informed of [facility]' ownership interest in [facility], as well as of the fact that they are under no obligation to receive care at this facility.  PASARR submitted to EDS on  PASARR number received on   FL2 transmitted to all facilities in geographic area requested by pt/family on  04/03/2015 FL2 transmitted to all facilities within larger geographic area on   Patient informed that his/her managed care company has contracts with or will negotiate with  certain facilities, including the following:     Patient/family informed of bed offers received:  04/03/2015 Patient chooses bed at [place] Physician recommends and patient chooses bed at    Patient to be transferred to  on   Patient to be transferred to facility by  Patient and family notified of transfer on  Name of family member notified:    The following physician request were entered in Epic:   Additional Comments:   Kingsley Spittle, [place] Intern,  [phone]

## 2015-04-03 NOTE — Evaluation (Signed)
Clinical/Bedside Swallow Evaluation Patient Details  Name: Bradley [name] MRN: [id number] Date of Birth: 05/17/1941  Today's Date: 04/03/2015 Time: SLP Start Time (ACUTE ONLY): 0919 SLP Stop Time (ACUTE ONLY): 0942 SLP Time Calculation (min) (ACUTE ONLY): 23 min  Past Medical History:  Past Medical History  Diagnosis Date  . Stroke   . Hypertension   . Hyperlipidemia   . Aortic stenosis   . CHF (congestive heart failure)   . MGUS (monoclonal gammopathy of unknown significance)   . Goiter   . Rhabdomyolysis   . UTI (urinary tract infection) 05/09/2014   Past Surgical History:  Past Surgical History  Procedure Laterality Date  . Circumcision     HPI:  74 year old male with history of stroke with residual right-sided deficits who presents with difficulty ambulating due to right leg weakness and confusion. Chest x-ray with new asymmetric airspace disease, which may be due to pneumonia.  Daughter denies difficulty with liquids previously, but that chewing foods was difficult.  Daughter also reports history of thyroid issues; SLP able to observe enlarged area on right side of neck.     Assessment / Plan / Recommendation Clinical Impression   Patient with baseline congestion (audible on inhalation and exhalation), which appeared to increase following sips of thin liquids and nectar-thick liquids despite Max verbal and tactile cues for portion control and pacing.  Patient also demonstrated wet vocal quality and delayed coughs.  Cues for multiple swallows and sips followed by a cough were not effective compensatory strategies given patient's difficulty following commands consistently.  Given, bedside presentation and SLP's inability to rule out aspiration an objective assessment is recommended.  Daughter present at bedside and in agreement.      Aspiration Risk  Moderate    Diet Recommendation NPO except meds   Medication Administration: Crushed with puree (with multiple swallows)    Other   Recommendations Recommended Consults: MBS Oral Care Recommendations: Oral care BID Other Recommendations: Have oral suction available   Follow Up Recommendations  Other (comment) (TBD)            Pertinent Vitals/Pain Audible congestion on inhalation and exhalation that increased with liquids     SLP Swallow Goals  Defer until after MBS   Swallow Study Prior Functional Status   Daughter reports Dys. 2 textures and thin liquids     General HPI: 74 year old male with history of stroke with residual right-sided deficits who presents with difficulty ambulating due to right leg weakness and confusion. Chest x-ray with new asymmetric airspace disease, which may be due to pneumonia.  Daughter denies difficulty with liquids previously, but that chewing foods was difficult.  Daughter also reports history of thyroid issues; SLP able to observe enlarged area on right side of neck.   Type of Study: Bedside swallow evaluation Previous Swallow Assessment: none on record Diet Prior to this Study: Dysphagia 2 (chopped);Thin liquids Temperature Spikes Noted: No Respiratory Status: Nasal cannula History of Recent Intubation: No Behavior/Cognition: Cooperative;Pleasant mood;Confused;Distractible;Requires cueing Oral Cavity - Dentition: Adequate natural dentition Self-Feeding Abilities: Needs assist;Needs set up Patient Positioning: Upright in bed Baseline Vocal Quality: Wet Volitional Cough: Congested Volitional Swallow: Able to elicit    Oral/Motor/Sensory Function Overall Oral Motor/Sensory Function: Appears within functional limits for tasks assessed   Ice Chips Ice chips: Not tested   Thin Liquid Thin Liquid: Impaired Presentation: Cup;Self Fed;Straw Oral Phase Impairments: Poor awareness of bolus Pharyngeal  Phase Impairments: Suspected delayed Swallow;Multiple swallows;Wet Vocal Quality;Decreased hyoid-laryngeal movement    Nectar  Thick Nectar Thick Liquid: Impaired Presentation:  Cup;Self Fed Oral Phase Impairments: Poor awareness of bolus Pharyngeal Phase Impairments: Wet Vocal Quality;Decreased hyoid-laryngeal movement;Suspected delayed Swallow   Honey Thick Honey Thick Liquid: Not tested   Puree Puree: Within functional limits Presentation: Self Fed;Spoon Other Comments: slightly prolonged oral phase   Solid   GO    Solid: Impaired Presentation: Self Fed Oral Phase Impairments: Impaired mastication;Reduced lingual movement/coordination;Poor awareness of bolus Oral Phase Functional Implications: Oral residue Other Comments: Dys. 2 meals were removed from oral cavity      [name], M.A., CCC-SLP [phone]  Bradley [name] 04/03/2015,9:58 AM

## 2015-04-03 NOTE — Progress Notes (Signed)
PATIENT DETAILS Name: Bradley [name] Age: 74 y.o. Sex: male Date of Birth: 09-18-1941 Admit Date: 03/31/2015 Admitting Physician Florencia Reasons, MD XTG:GYIRSW,NIOEV Marigene Ehlers, MD  Subjective: Mental status better, still requiring oxygen 4-5 L/m  Assessment/Plan: Active Problems:   Acute encephalopathy: Likely secondary to UTI. Suspect some amount of dementia at baseline. Treated with IV Abx mental status significantly improved and at baseline (per daughter).    UTI: Patient presented with confusion, UA was consistent with UTI. He however did not have any fever. Patient was empirically started on levofloxacin-however changed to Zosyn given aspiration pneumonia. Urine culture shows pansensitive Klebsiella pneumoniae.    Acute hypoxic respiratory failure: Suspect that this is more from aspiration pneumonia, continue oxygen as needed, supportive care and IV Zosyn. Start incentive spirometry and as needed bronchodilators. Follow.    Suspected aspiration pneumonia: Given history of CVA, and unilateral right-sided infiltrates-suspect aspiration pneumonia. Initially on Levaquin, has now been discontinued, patient now on Zosyn. Appreciate SLP evaluation, patient started on dysphagia 3 diet, daughter aware of risks of aspiration   Acute diastolic heart failure: Given numerous doses of Lasix, however suspect that respiratory failure was probably more from aspiration pneumonia. Hold Lasix today, recheck electrolytes. Appears relatively euvolemic on my exam. Appreciate cardiology assistance    Acute on chronic kidney disease stage III: Creatinine slowly increasing, suspect multifactorial etiology-combination of prerenal azotemia from aspiration pneumonia/UTI/Lasix. Appears euvolemic will hold Lasix today. Recheck electrolytes in a.m. If continues to worsen we'll order a renal ultrasound    Minimally elevated troponin: Patient without any chest pain or shortness of breath, this is likely secondary to chronic  kidney disease. Recent echocardiogram showed preserved ejection fraction without any wall motion abnormality. Cardiology is following. Continue aspirin.   History of severe aortic stenosis: Defer to cardiology   History of hypertension: Hold Avapro given worsening renal function, continue clonidine, labetalol and hydralazine   History of CVA: Continue aspirin. Speech at baseline is dysarthric. Nonfocal exam, but has some generalized weakness.   Suspected mild dementia: Current mental status close to usual baseline, suspect some mild sundowning intermittently as well. Per patient's daughter, patient is close to his usual baseline   Disposition: Remain inpatient  Antibiotics:  See below   Anti-infectives    Start     Dose/Rate Route Frequency Ordered Stop   04/04/15 0000  levofloxacin (LEVAQUIN) 500 MG tablet     500 mg Oral Every 48 hours 04/02/15 1126     04/02/15 2100  levofloxacin (LEVAQUIN) IVPB 500 mg  Status:  Discontinued     500 mg 100 mL/hr over 60 Minutes Intravenous Every 48 hours 03/31/15 2010 04/02/15 1058   04/02/15 1830  piperacillin-tazobactam (ZOSYN) IVPB 3.375 g     3.375 g 12.5 mL/hr over 240 Minutes Intravenous Every 8 hours 04/02/15 1743     04/02/15 1100  levofloxacin (LEVAQUIN) tablet 500 mg  Status:  Discontinued     500 mg Oral Every 48 hours 04/02/15 1058 04/02/15 1729   03/31/15 1930  levofloxacin (LEVAQUIN) IVPB 750 mg     750 mg 100 mL/hr over 90 Minutes Intravenous  Once 03/31/15 1838 03/31/15 2214   03/31/15 1715  cefTRIAXone (ROCEPHIN) 1 g in dextrose 5 % 50 mL IVPB     1 g 100 mL/hr over 30 Minutes Intravenous  Once 03/31/15 1709 03/31/15 1758   03/31/15 1700  cephALEXin (KEFLEX) capsule 500 mg  Status:  Discontinued     500 mg  Oral  Once 03/31/15 1654 03/31/15 1709   03/31/15 0000  cephALEXin (KEFLEX) 500 MG capsule  Status:  Discontinued     500 mg Oral 3 times daily 03/31/15 1657 04/02/15       DVT Prophylaxis: Prophylactic Lovenox    Code Status: Full code   Family Communication Daughter-[name] at bedside  Procedures:  None  CONSULTS:  cardiology   MEDICATIONS: Scheduled Meds: . amLODipine  10 mg Oral Daily  . aspirin EC  81 mg Oral Daily  . cloNIDine  0.3 mg Transdermal Weekly  . enoxaparin (LOVENOX) injection  40 mg Subcutaneous Daily  . hydrALAZINE  100 mg Oral Daily  . hydrALAZINE  50 mg Oral QHS  . labetalol  300 mg Oral BID  . piperacillin-tazobactam (ZOSYN)  IV  3.375 g Intravenous Q8H  . potassium chloride  20 mEq Oral Daily  . pravastatin  40 mg Oral Daily  . sodium chloride  3 mL Intravenous Q12H  . tamsulosin  0.4 mg Oral BID   Continuous Infusions:  PRN Meds:.albuterol, RESOURCE THICKENUP CLEAR    PHYSICAL EXAM: Vital signs in last 24 hours: Filed Vitals:   04/03/15 0512 04/03/15 0515 04/03/15 1050 04/03/15 1437  BP: 127/75  122/56 127/57  Pulse: 74 77  64  Temp: 98.7 F (37.1 C)   98.2 F (36.8 C)  TempSrc: Oral   Oral  Resp: 18     Height:      Weight:      SpO2:  91%  99%    Weight change:  Filed Weights   03/31/15 1907  Weight: 65 kg (143 lb 4.8 oz)   Body mass index is 23.85 kg/(m^2).   Gen Exam: Awake and alert.dysarthric at baseline  Neck: Supple, No JVD.   Chest: bibasilar rales S1 S2 Regular,Systolic murmurs.  Abdomen: soft, BS +, non tender, non distended.  Extremities: no edema, lower extremities warm to touch. Neurologic: Non Focal.   Skin: No Rash.   Wounds: N/A.   Intake/Output from previous day:  Intake/Output Summary (Last 24 hours) at 04/03/15 1606 Last data filed at 04/03/15 0601  Gross per 24 hour  Intake    700 ml  Output    800 ml  Net   -100 ml     LAB RESULTS: CBC  Recent Labs Lab 03/31/15 1512 04/01/15 0705 04/02/15 0306 04/03/15 0646  WBC 8.9 9.0 6.9 6.6  HGB 10.1* 9.8* 9.8* 9.6*  HCT 31.4* 30.8* 30.6* 29.6*  PLT 152 156 156 163  MCV 84.6 85.1 83.8 83.6  MCH 27.2 27.1 26.8 27.1  MCHC 32.2 31.8 32.0 32.4  RDW 13.5  13.5 13.6 13.5  LYMPHSABS 1.0  --   --   --   MONOABS 0.7  --   --   --   EOSABS 0.0  --   --   --   BASOSABS 0.0  --   --   --     Chemistries   Recent Labs Lab 03/31/15 1512 04/01/15 0705 04/02/15 0306 04/03/15 0646  NA 137 137 142 143  K 3.6 3.4* 4.0 4.5  CL 102 104 110 111  CO2 20 22 24 23   GLUCOSE 104* 123* 146* 123*  BUN 36* 34* 40* 55*  CREATININE 1.45* 1.51* 1.65* 1.93*  CALCIUM 9.8 9.6 9.5 9.7    CBG: No results for input(s): GLUCAP in the last 168 hours.  GFR Estimated Creatinine Clearance: 29.7 mL/min (by C-G formula based on Cr of 1.93).  Coagulation  profile No results for input(s): INR, PROTIME in the last 168 hours.  Cardiac Enzymes  Recent Labs Lab 04/01/15 1557 04/01/15 2120 04/02/15 0306  TROPONINI 0.19* 0.39* 0.51*    Invalid input(s): POCBNP No results for input(s): DDIMER in the last 72 hours. No results for input(s): HGBA1C in the last 72 hours. No results for input(s): CHOL, HDL, LDLCALC, TRIG, CHOLHDL, LDLDIRECT in the last 72 hours.  Recent Labs  04/01/15 2120  TSH 0.028*   No results for input(s): VITAMINB12, FOLATE, FERRITIN, TIBC, IRON, RETICCTPCT in the last 72 hours. No results for input(s): LIPASE, AMYLASE in the last 72 hours.  Urine Studies No results for input(s): UHGB, CRYS in the last 72 hours.  Invalid input(s): UACOL, UAPR, USPG, UPH, UTP, UGL, UKET, UBIL, UNIT, UROB, ULEU, UEPI, UWBC, URBC, UBAC, CAST, UCOM, BILUA  MICROBIOLOGY: Recent Results (from the past 240 hour(s))  Urine culture     Status: None   Collection Time: 03/31/15  3:21 PM  Result Value Ref Range Status   Specimen Description URINE, RANDOM  Final   Special Requests NONE  Final   Colony Count   Final    >=100,000 COLONIES/ML Performed at [place] Performed at [facility]    Report Status 04/03/2015 FINAL  Final   Organism ID, Bacteria KLEBSIELLA PNEUMONIAE  Final       Susceptibility   Klebsiella pneumoniae - MIC*    AMPICILLIN RESISTANT      CEFAZOLIN <=4 SENSITIVE Sensitive     CEFTRIAXONE <=1 SENSITIVE Sensitive     CIPROFLOXACIN <=0.25 SENSITIVE Sensitive     GENTAMICIN <=1 SENSITIVE Sensitive     LEVOFLOXACIN 1 SENSITIVE Sensitive     NITROFURANTOIN 32 SENSITIVE Sensitive     TOBRAMYCIN <=1 SENSITIVE Sensitive     TRIMETH/SULFA <=20 SENSITIVE Sensitive     PIP/TAZO <=4 SENSITIVE Sensitive     * KLEBSIELLA PNEUMONIAE    RADIOLOGY STUDIES/RESULTS: Dg Chest Port 1 View  04/02/2015   CLINICAL DATA:  Congestive heart failure.  Urinary tract infection.  EXAM: PORTABLE CHEST - 1 VIEW  COMPARISON:  03/31/2015  FINDINGS: New asymmetric airspace disease is seen throughout the right lung, and perhaps to a lesser degree in the retrocardiac left lower lobe. No definite pleural effusion seen on this portable exam. Cardiomegaly is stable.  IMPRESSION: New asymmetric airspace disease, which may be due to pneumonia or asymmetric pulmonary edema.  Stable cardiomegaly.   Electronically Signed   By: Earle Gell M.D.   On: 04/02/2015 14:05   Dg Chest Port 1 View  03/31/2015   CLINICAL DATA:  Fatigue  EXAM: PORTABLE CHEST - 1 VIEW  COMPARISON:  11/16/2014  FINDINGS: Cardiac shadow is enlarged. Aortic calcifications are again noted. No focal infiltrate or sizable effusion is seen. No bony abnormality is noted.  IMPRESSION: No active disease.   Electronically Signed   By: Inez Catalina M.D.   On: 03/31/2015 15:56   Dg Swallowing Func-speech Pathology  04/03/2015     Objective Swallowing Evaluation:    Patient Details  Name: ARHAAN CHESNUT MRN: [id number] Date of Birth: [birth year]  Today's Date: 04/03/2015 Time: SLP Start Time (ACUTE ONLY): 1335-SLP Stop Time (ACUTE ONLY): 1405 SLP Time Calculation (min) (ACUTE ONLY): 30 min  Past Medical History:  Past Medical History  Diagnosis Date  . Stroke   . Hypertension   . Hyperlipidemia   . Aortic stenosis   .  CHF (congestive heart failure)   .  MGUS (monoclonal gammopathy of unknown significance)   . Goiter   . Rhabdomyolysis   . UTI (urinary tract infection) 05/09/2014   Past Surgical History:  Past Surgical History  Procedure Laterality Date  . Circumcision     HPI:  HPI: 74 year old male with history of stroke with residual right-sided  deficits who presents with difficulty ambulating due to right leg weakness  and confusion. Chest x-ray with new asymmetric airspace disease, which may  be due to pneumonia.  Daughter denies difficulty with liquids previously,  but that chewing foods was difficult.  Daughter also reports history of  thyroid issues; SLP able to observe enlarged area on right side of neck.     No Data Recorded  Assessment / Plan / Recommendation CHL IP CLINICAL IMPRESSIONS 04/03/2015  Dysphagia Diagnosis Mild oral phase dysphagia;Moderate pharyngeal phase  dysphagia  Clinical impression Pt presents with mild oral and moderate pharyngeal  phase dysphagia characterized by delayed oral transit, delayed swallow  initiation to the pyriforms. Pt aspirated thin liquids before the swallow  with sensation. Flash penetration noted with nectar-thick liquids.  Noted  lingual residue post-swallow, which then pools in valleculae, suspect due  to reduced bolus manipulation. Residue removed following cued dry swallow.  Recommend pt initiate dys 3/ nectar thick liquid diet; small sips; two  swallows following sips of liquids. Pt still at risk for aspiration given  delayed swallow initiation and base of tongue weakness. Speech will  follow-up with diet tolerance.       CHL IP TREATMENT RECOMMENDATION 04/03/2015  Treatment Plan Recommendations Therapy as outlined in treatment plan below      CHL IP DIET RECOMMENDATION 04/03/2015  Diet Recommendations Dysphagia 3 (Mechanical Soft);Nectar-thick liquid  Liquid Administration via Straw;Cup  Medication Administration Whole meds with liquid  Compensations Slow rate;Small sips/bites;Multiple dry swallows after each   bite/sip  Postural Changes and/or Swallow Maneuvers Seated upright 90 degrees     CHL IP OTHER RECOMMENDATIONS 04/03/2015  Recommended Consults (None)  Oral Care Recommendations Oral care BID  Other Recommendations Have oral suction available     CHL IP FOLLOW UP RECOMMENDATIONS 04/03/2015  Follow up Recommendations Other (comment)     CHL IP FREQUENCY AND DURATION 04/03/2015  Speech Therapy Frequency (ACUTE ONLY) min 2x/week  Treatment Duration 2 weeks     Pertinent Vitals/Pain NA    SLP Swallow Goals No flowsheet data found.  No flowsheet data found.    CHL IP REASON FOR REFERRAL 04/03/2015  Reason for Referral Objectively evaluate swallowing function     CHL IP ORAL PHASE 04/03/2015  Lips (None)  Tongue (None)  Mucous membranes (None)  Nutritional status (None)  Other (None)  Oxygen therapy (None)  Oral Phase Impaired  Oral - Pudding Teaspoon (None)  Oral - Pudding Cup (None)  Oral - Honey Teaspoon (None)  Oral - Honey Cup (None)  Oral - Honey Syringe (None)  Oral - Nectar Teaspoon (None)  Oral - Nectar Cup (None)  Oral - Nectar Straw Lingual/palatal residue  Oral - Nectar Syringe (None)  Oral - Ice Chips (None)  Oral - Thin Teaspoon (None)  Oral - Thin Cup (None)  Oral - Thin Straw Lingual/palatal residue  Oral - Thin Syringe (None)  Oral - Puree Delayed oral transit;Lingual/palatal residue  Oral - Mechanical Soft (None)  Oral - Regular Delayed oral transit;Other (Comment);Lingual/palatal  residue  Oral - Multi-consistency (None)  Oral - Pill (None)  Oral Phase - Comment (  None)      CHL IP PHARYNGEAL PHASE 04/03/2015  Pharyngeal Phase Impaired  Pharyngeal - Pudding Teaspoon (None)  Penetration/Aspiration details (pudding teaspoon) (None)  Pharyngeal - Pudding Cup (None)  Penetration/Aspiration details (pudding cup) (None)  Pharyngeal - Honey Teaspoon (None)  Penetration/Aspiration details (honey teaspoon) (None)  Pharyngeal - Honey Cup (None)  Penetration/Aspiration details (honey cup) (None)  Pharyngeal - Honey Syringe  (None)  Penetration/Aspiration details (honey syringe) (None)  Pharyngeal - Nectar Teaspoon (None)  Penetration/Aspiration details (nectar teaspoon) (None)  Pharyngeal - Nectar Cup Delayed swallow initiation;Premature spillage to  pyriform sinuses;Pharyngeal residue - valleculae;Penetration/Aspiration  during swallow  Penetration/Aspiration details (nectar cup) Material enters airway,  remains ABOVE vocal cords then ejected out  Pharyngeal - Nectar Straw (None)  Penetration/Aspiration details (nectar straw) (None)  Pharyngeal - Nectar Syringe (None)  Penetration/Aspiration details (nectar syringe) (None)  Pharyngeal - Ice Chips (None)  Penetration/Aspiration details (ice chips) (None)  Pharyngeal - Thin Teaspoon (None)  Penetration/Aspiration details (thin teaspoon) (None)  Pharyngeal - Thin Cup (None)  Penetration/Aspiration details (thin cup) (None)  Pharyngeal - Thin Straw Delayed swallow initiation;Premature spillage to  pyriform sinuses;Penetration/Aspiration before swallow  Penetration/Aspiration details (thin straw) Material enters airway, passes  BELOW cords and not ejected out despite cough attempt by patient  Pharyngeal - Thin Syringe (None)  Penetration/Aspiration details (thin syringe') (None)  Pharyngeal - Puree Delayed swallow initiation;Premature spillage to  valleculae;Pharyngeal residue - valleculae  Penetration/Aspiration details (puree) (None)  Pharyngeal - Mechanical Soft (None)  Penetration/Aspiration details (mechanical soft) (None)  Pharyngeal - Regular Delayed swallow initiation;Premature spillage to  valleculae;Pharyngeal residue - valleculae  Penetration/Aspiration details (regular) (None)  Pharyngeal - Multi-consistency (None)  Penetration/Aspiration details (multi-consistency) (None)  Pharyngeal - Pill Delayed swallow initiation  Penetration/Aspiration details (pill) (None)  Pharyngeal Comment (None)     CHL IP CERVICAL ESOPHAGEAL PHASE 04/03/2015  Cervical Esophageal Phase WFL  Pudding  Teaspoon (None)  Pudding Cup (None)  Honey Teaspoon (None)  Honey Cup (None)  Honey Syringe (None)  Nectar Teaspoon (None)  Nectar Cup (None)  Nectar Straw (None)  Nectar Syringe (None)  Thin Teaspoon (None)  Thin Cup (None)  Thin Straw (None)  Thin Syringe (None)  Cervical Esophageal Comment (None)    No flowsheet data found.        Herbie Baltimore, [place] [phone]  Lynann Beaver 04/03/2015, 3:08 PM     Oren Binet, MD  [facility] Pager:[phone]  If 7PM-7AM, please contact night-coverage [email or web address] Password [email or web address] 04/03/2015, 4:06 PM   LOS: 3 days

## 2015-04-03 NOTE — Progress Notes (Signed)
MBSS complete. Full report located under chart review in imaging section. Supervised and reviewed by Herbie Baltimore MA CCC-SLP

## 2015-04-03 NOTE — Progress Notes (Signed)
CARE MANAGEMENT NOTE 04/03/2015  Patient:  Bradley [name], Bradley [name]   Account Number:  [id number]  Date Initiated:  04/02/2015  Documentation initiated by:  [facility]  Subjective/Objective Assessment:   admitted with weakness, UTI,confusion     Action/Plan:   plan HH- RN, PT, aide   Anticipated DC Date:  04/02/2015   Anticipated DC Plan:  [facility]  CM consult      [facility] Choice  HOME HEALTH  DURABLE MEDICAL EQUIPMENT   Choice offered to / List presented to:  C-4 Adult Children           Status of service:  Completed, signed off Medicare Important Message given?  YES (If response is "NO", the following Medicare IM given date fields will be blank) Date Medicare IM given:  04/03/2015 Medicare IM given by:  [facility] Date Additional Medicare IM given:   Additional Medicare IM given by:    Discharge Disposition:  [place]  Per UR Regulation:  Reviewed for med. necessity/level of care/duration of stay  If discussed at [place] of Stay Meetings, dates discussed:    Comments:  04/03/2015 1720 Plan dc to SNF, CSW following for SNF. Will notify [place] on 04/04/2015 of dc to SNF. Jonnie Finner RN CCM Case Mgmt phone [phone]  04/02/15 Spoke with patient, wife and daughter, who stated that she has POA, about HHC. They selected [facility]. Contacted Bradley [name] at [place] and set up [facility] [place] and aide. Patient uses a rolling walker at home.

## 2015-04-04 ENCOUNTER — Inpatient Hospital Stay (HOSPITAL_COMMUNITY): Payer: Medicare Other

## 2015-04-04 DIAGNOSIS — J189 Pneumonia, unspecified organism: Secondary | ICD-10-CM | POA: Insufficient documentation

## 2015-04-04 DIAGNOSIS — I248 Other forms of acute ischemic heart disease: Secondary | ICD-10-CM

## 2015-04-04 DIAGNOSIS — R778 Other specified abnormalities of plasma proteins: Secondary | ICD-10-CM | POA: Insufficient documentation

## 2015-04-04 DIAGNOSIS — R7989 Other specified abnormal findings of blood chemistry: Secondary | ICD-10-CM

## 2015-04-04 DIAGNOSIS — N179 Acute kidney failure, unspecified: Secondary | ICD-10-CM

## 2015-04-04 LAB — BASIC METABOLIC PANEL
Anion gap: 8 (ref 5–15)
BUN: 64 mg/dL — AB (ref 6–23)
CO2: 23 mmol/L (ref 19–32)
Calcium: 9.5 mg/dL (ref 8.4–10.5)
Chloride: 110 mmol/L (ref 96–112)
Creatinine, Ser: 1.96 mg/dL — ABNORMAL HIGH (ref 0.50–1.35)
GFR calc Af Amer: 37 mL/min — ABNORMAL LOW (ref >90)
GFR, EST NON AFRICAN AMERICAN: 32 mL/min — AB (ref >90)
GLUCOSE: 112 mg/dL — AB (ref 70–99)
Potassium: 4.7 mmol/L (ref 3.5–5.1)
Sodium: 141 mmol/L (ref 135–145)

## 2015-04-04 LAB — CBC
HEMATOCRIT: 27.4 % — AB (ref 39.0–52.0)
HEMOGLOBIN: 8.8 g/dL — AB (ref 13.0–17.0)
MCH: 26.7 pg (ref 26.0–34.0)
MCHC: 32.1 g/dL (ref 30.0–36.0)
MCV: 83 fL (ref 78.0–100.0)
Platelets: 181 10*3/uL (ref 150–400)
RBC: 3.3 MIL/uL — ABNORMAL LOW (ref 4.22–5.81)
RDW: 13.8 % (ref 11.5–15.5)
WBC: 6.3 10*3/uL (ref 4.0–10.5)

## 2015-04-04 MED ORDER — IPRATROPIUM-ALBUTEROL 0.5-2.5 (3) MG/3ML IN SOLN
3.0000 mL | Freq: Two times a day (BID) | RESPIRATORY_TRACT | Status: DC
  Administered 2015-04-04 – 2015-04-05 (×2): 3 mL via RESPIRATORY_TRACT
  Filled 2015-04-04 (×2): qty 3

## 2015-04-04 MED ORDER — ENOXAPARIN SODIUM 30 MG/0.3ML ~~LOC~~ SOLN
30.0000 mg | Freq: Every day | SUBCUTANEOUS | Status: DC
  Administered 2015-04-05: 30 mg via SUBCUTANEOUS
  Filled 2015-04-04: qty 0.3

## 2015-04-04 MED ORDER — FUROSEMIDE 10 MG/ML IJ SOLN
40.0000 mg | Freq: Once | INTRAMUSCULAR | Status: AC
  Administered 2015-04-04: 40 mg via INTRAVENOUS
  Filled 2015-04-04: qty 4

## 2015-04-04 NOTE — Progress Notes (Signed)
PATIENT DETAILS Name: Bradley [name] Age: 74 y.o. Sex: male Date of Birth: September 07, 1941 Admit Date: 03/31/2015 Admitting Physician Florencia Reasons, MD JYN:[id number] Marigene Ehlers, MD  Subjective: Overall much better-sitting at bedside. Respiratory status seems to have improved today.  Assessment/Plan: Active Problems:   Acute encephalopathy: Likely secondary to UTI. Suspect some amount of dementia at baseline. Treated with IV Abx mental status significantly improved and at baseline (per daughter).    UTI: Patient presented with confusion, UA was consistent with UTI. He however did not have any fever. Patient was empirically started on levofloxacin-however changed to Zosyn given aspiration pneumonia. Urine culture shows pansensitive Klebsiella pneumoniae.    Acute hypoxic respiratory failure: Suspect that this is more from aspiration pneumonia with some element of acute diastolic heart failure.Continue oxygen as needed, supportive care,prn IV Lasix and IV Zosyn. Start incentive spirometry and as needed bronchodilators. Follow.    Suspected aspiration pneumonia: Given history of CVA, and unilateral right-sided infiltrates-suspect aspiration pneumonia. Initially on Levaquin, has now been discontinued, patient now on Zosyn. Appreciate SLP evaluation, patient started on dysphagia 3 diet, daughter aware of risks of aspiration   Acute diastolic heart failure: Given numerous doses of prn Lasix, however suspect that respiratory failure is multifactorial from both aspiration pneumonia and acute diastolic heart failure. Challenging situation given increases in his creatinine and severe aortic stenosis. Appreciate cardiology evaluation    Acute on chronic kidney disease stage III: Creatinine slowly increasing, suspect multifactorial etiology-combination of prerenal azotemia from aspiration pneumonia/UTI/Lasix. Case discussed with patient's primary nephrologist Dr Mercy Moore, who suggested patient's baseline  creatinine was more like 1.8 1.9 range. He suggested to continue with current measures and to consult nephrology formerly of significant increase in creatinine and baseline.    Minimally elevated troponin: Patient without any chest pain or shortness of breath, this is likely secondary to chronic kidney disease. Recent echocardiogram showed preserved ejection fraction without any wall motion abnormality. Cardiology is following. Continue aspirin.   History of severe aortic stenosis: Defer to cardiology   History of hypertension: Hold Avapro given worsening renal function, continue clonidine, labetalol and hydralazine   History of CVA: Continue aspirin. Speech at baseline is dysarthric. Nonfocal exam, but has some generalized weakness.   Suspected mild dementia: Current mental status close to usual baseline, suspect some mild sundowning intermittently as well. Per patient's daughter, patient is close to his usual baseline   Disposition: Remain inpatient  Antibiotics:  See below   Anti-infectives    Start     Dose/Rate Route Frequency Ordered Stop   04/04/15 0000  levofloxacin (LEVAQUIN) 500 MG tablet     500 mg Oral Every 48 hours 04/02/15 1126     04/02/15 2100  levofloxacin (LEVAQUIN) IVPB 500 mg  Status:  Discontinued     500 mg 100 mL/hr over 60 Minutes Intravenous Every 48 hours 03/31/15 2010 04/02/15 1058   04/02/15 1830  piperacillin-tazobactam (ZOSYN) IVPB 3.375 g     3.375 g 12.5 mL/hr over 240 Minutes Intravenous Every 8 hours 04/02/15 1743     04/02/15 1100  levofloxacin (LEVAQUIN) tablet 500 mg  Status:  Discontinued     500 mg Oral Every 48 hours 04/02/15 1058 04/02/15 1729   03/31/15 1930  levofloxacin (LEVAQUIN) IVPB 750 mg     750 mg 100 mL/hr over 90 Minutes Intravenous  Once 03/31/15 1838 03/31/15 2214   03/31/15 1715  cefTRIAXone (ROCEPHIN) 1 g in dextrose 5 % 50 mL IVPB  1 g 100 mL/hr over 30 Minutes Intravenous  Once 03/31/15 1709 03/31/15 1758   03/31/15  1700  cephALEXin (KEFLEX) capsule 500 mg  Status:  Discontinued     500 mg Oral  Once 03/31/15 1654 03/31/15 1709   03/31/15 0000  cephALEXin (KEFLEX) 500 MG capsule  Status:  Discontinued     500 mg Oral 3 times daily 03/31/15 1657 04/02/15       DVT Prophylaxis: Prophylactic Lovenox   Code Status: Full code   Family Communication Daughter-[name] at bedside  Procedures:  None  CONSULTS:  cardiology   MEDICATIONS: Scheduled Meds: . amLODipine  10 mg Oral Daily  . aspirin EC  81 mg Oral Daily  . cloNIDine  0.3 mg Transdermal Weekly  . [START ON 04/05/2015] enoxaparin (LOVENOX) injection  30 mg Subcutaneous Daily  . guaiFENesin  600 mg Oral BID  . hydrALAZINE  100 mg Oral Daily  . hydrALAZINE  50 mg Oral QHS  . ipratropium-albuterol  3 mL Nebulization BID  . labetalol  300 mg Oral BID  . piperacillin-tazobactam (ZOSYN)  IV  3.375 g Intravenous Q8H  . potassium chloride  20 mEq Oral Daily  . pravastatin  40 mg Oral Daily  . sodium chloride  3 mL Intravenous Q12H  . tamsulosin  0.4 mg Oral BID   Continuous Infusions:  PRN Meds:.albuterol, RESOURCE THICKENUP CLEAR    PHYSICAL EXAM: Vital signs in last 24 hours: Filed Vitals:   04/04/15 0005 04/04/15 0618 04/04/15 0750 04/04/15 1053  BP: 109/46 144/65  144/66  Pulse:      Temp:  98.9 F (37.2 C)    TempSrc:  Oral    Resp:  20    Height:      Weight:      SpO2:  96% 96%     Weight change:  Filed Weights   03/31/15 1907  Weight: 65 kg (143 lb 4.8 oz)   Body mass index is 23.85 kg/(m^2).   Gen Exam: Awake and alert.dysarthric at baseline  Neck: Supple, No JVD.   Chest: bibasilar rales S1 S2 Regular,Systolic murmurs.  Abdomen: soft, BS +, non tender, non distended.  Extremities: no edema, lower extremities warm to touch. Neurologic: Non Focal.   Skin: No Rash.   Wounds: N/A.   Intake/Output from previous day:  Intake/Output Summary (Last 24 hours) at 04/04/15 1447 Last data filed at 04/04/15 0725   Gross per 24 hour  Intake      0 ml  Output    200 ml  Net   -200 ml     LAB RESULTS: CBC  Recent Labs Lab 03/31/15 1512 04/01/15 0705 04/02/15 0306 04/03/15 0646 04/04/15 0608  WBC 8.9 9.0 6.9 6.6 6.3  HGB 10.1* 9.8* 9.8* 9.6* 8.8*  HCT 31.4* 30.8* 30.6* 29.6* 27.4*  PLT 152 156 156 163 181  MCV 84.6 85.1 83.8 83.6 83.0  MCH 27.2 27.1 26.8 27.1 26.7  MCHC 32.2 31.8 32.0 32.4 32.1  RDW 13.5 13.5 13.6 13.5 13.8  LYMPHSABS 1.0  --   --   --   --   MONOABS 0.7  --   --   --   --   EOSABS 0.0  --   --   --   --   BASOSABS 0.0  --   --   --   --     Chemistries   Recent Labs Lab 03/31/15 1512 04/01/15 0705 04/02/15 0306 04/03/15 0646 04/04/15 0608  NA 137  137 142 143 141  K 3.6 3.4* 4.0 4.5 4.7  CL 102 104 110 111 110  CO2 20 22 24 23 23   GLUCOSE 104* 123* 146* 123* 112*  BUN 36* 34* 40* 55* 64*  CREATININE 1.45* 1.51* 1.65* 1.93* 1.96*  CALCIUM 9.8 9.6 9.5 9.7 9.5    CBG: No results for input(s): GLUCAP in the last 168 hours.  GFR Estimated Creatinine Clearance: 29.2 mL/min (by C-G formula based on Cr of 1.96).  Coagulation profile No results for input(s): INR, PROTIME in the last 168 hours.  Cardiac Enzymes  Recent Labs Lab 04/01/15 1557 04/01/15 2120 04/02/15 0306  TROPONINI 0.19* 0.39* 0.51*    Invalid input(s): POCBNP No results for input(s): DDIMER in the last 72 hours. No results for input(s): HGBA1C in the last 72 hours. No results for input(s): CHOL, HDL, LDLCALC, TRIG, CHOLHDL, LDLDIRECT in the last 72 hours.  Recent Labs  04/01/15 2120  TSH 0.028*   No results for input(s): VITAMINB12, FOLATE, FERRITIN, TIBC, IRON, RETICCTPCT in the last 72 hours. No results for input(s): LIPASE, AMYLASE in the last 72 hours.  Urine Studies No results for input(s): UHGB, CRYS in the last 72 hours.  Invalid input(s): UACOL, UAPR, USPG, UPH, UTP, UGL, UKET, UBIL, UNIT, UROB, ULEU, UEPI, UWBC, URBC, UBAC, CAST, UCOM,  BILUA  MICROBIOLOGY: Recent Results (from the past 240 hour(s))  Urine culture     Status: None   Collection Time: 03/31/15  3:21 PM  Result Value Ref Range Status   Specimen Description URINE, RANDOM  Final   Special Requests NONE  Final   Colony Count   Final    >=100,000 COLONIES/ML Performed at [place] Performed at [facility]    Report Status 04/03/2015 FINAL  Final   Organism ID, Bacteria KLEBSIELLA PNEUMONIAE  Final      Susceptibility   Klebsiella pneumoniae - MIC*    AMPICILLIN RESISTANT      CEFAZOLIN <=4 SENSITIVE Sensitive     CEFTRIAXONE <=1 SENSITIVE Sensitive     CIPROFLOXACIN <=0.25 SENSITIVE Sensitive     GENTAMICIN <=1 SENSITIVE Sensitive     LEVOFLOXACIN 1 SENSITIVE Sensitive     NITROFURANTOIN 32 SENSITIVE Sensitive     TOBRAMYCIN <=1 SENSITIVE Sensitive     TRIMETH/SULFA <=20 SENSITIVE Sensitive     PIP/TAZO <=4 SENSITIVE Sensitive     * KLEBSIELLA PNEUMONIAE    RADIOLOGY STUDIES/RESULTS: Dg Chest Port 1 View  04/04/2015   CLINICAL DATA:  Pneumonia  EXAM: PORTABLE CHEST - 1 VIEW  COMPARISON:  04/02/2015; 03/31/2015; 11/16/2014  FINDINGS: Grossly unchanged enlarged cardiac silhouette and mediastinal contours with atherosclerotic plaque within the thoracic aorta. Ill-defined heterogeneous opacities within the right mid lung have minimally worsened in the interval. Pulmonary vasculature is less distinct than present examination. No definite pleural effusion or pneumothorax. Unchanged bones. A minimal amount of enteric contrast is seen within the stomach and splenic flexure of the colon.  IMPRESSION: Findings suggestive of worsening pulmonary edema with worsening superimposed heterogeneous airspace opacities within the right mid lung with differential considerations including asymmetric alveolar pulmonary edema versus underlying infection. Continued attention on follow-up is recommended.    Electronically Signed   By: Sandi Mariscal M.D.   On: 04/04/2015 07:37   Dg Chest Port 1 View  04/02/2015   CLINICAL DATA:  Congestive heart failure.  Urinary tract infection.  EXAM: PORTABLE CHEST - 1 VIEW  COMPARISON:  03/31/2015  FINDINGS: New asymmetric airspace disease is seen throughout the right lung, and perhaps to a lesser degree in the retrocardiac left lower lobe. No definite pleural effusion seen on this portable exam. Cardiomegaly is stable.  IMPRESSION: New asymmetric airspace disease, which may be due to pneumonia or asymmetric pulmonary edema.  Stable cardiomegaly.   Electronically Signed   By: Earle Gell M.D.   On: 04/02/2015 14:05   Dg Chest Port 1 View  03/31/2015   CLINICAL DATA:  Fatigue  EXAM: PORTABLE CHEST - 1 VIEW  COMPARISON:  11/16/2014  FINDINGS: Cardiac shadow is enlarged. Aortic calcifications are again noted. No focal infiltrate or sizable effusion is seen. No bony abnormality is noted.  IMPRESSION: No active disease.   Electronically Signed   By: Inez Catalina M.D.   On: 03/31/2015 15:56   Dg Swallowing Func-speech Pathology  04/03/2015     Objective Swallowing Evaluation:    Patient Details  Name: ALGIE WESTRY MRN: [id number] Date of Birth: [birth year]  Today's Date: 04/03/2015 Time: SLP Start Time (ACUTE ONLY): 1335-SLP Stop Time (ACUTE ONLY): 1405 SLP Time Calculation (min) (ACUTE ONLY): 30 min  Past Medical History:  Past Medical History  Diagnosis Date  . Stroke   . Hypertension   . Hyperlipidemia   . Aortic stenosis   . CHF (congestive heart failure)   . MGUS (monoclonal gammopathy of unknown significance)   . Goiter   . Rhabdomyolysis   . UTI (urinary tract infection) 05/09/2014   Past Surgical History:  Past Surgical History  Procedure Laterality Date  . Circumcision     HPI:  HPI: 74 year old male with history of stroke with residual right-sided  deficits who presents with difficulty ambulating due to right leg weakness  and confusion. Chest x-ray with new asymmetric airspace  disease, which may  be due to pneumonia.  Daughter denies difficulty with liquids previously,  but that chewing foods was difficult.  Daughter also reports history of  thyroid issues; SLP able to observe enlarged area on right side of neck.     No Data Recorded  Assessment / Plan / Recommendation CHL IP CLINICAL IMPRESSIONS 04/03/2015  Dysphagia Diagnosis Mild oral phase dysphagia;Moderate pharyngeal phase  dysphagia  Clinical impression Pt presents with mild oral and moderate pharyngeal  phase dysphagia characterized by delayed oral transit, delayed swallow  initiation to the pyriforms. Pt aspirated thin liquids before the swallow  with sensation. Flash penetration noted with nectar-thick liquids.  Noted  lingual residue post-swallow, which then pools in valleculae, suspect due  to reduced bolus manipulation. Residue removed following cued dry swallow.  Recommend pt initiate dys 3/ nectar thick liquid diet; small sips; two  swallows following sips of liquids. Pt still at risk for aspiration given  delayed swallow initiation and base of tongue weakness. Speech will  follow-up with diet tolerance.       CHL IP TREATMENT RECOMMENDATION 04/03/2015  Treatment Plan Recommendations Therapy as outlined in treatment plan below      CHL IP DIET RECOMMENDATION 04/03/2015  Diet Recommendations Dysphagia 3 (Mechanical Soft);Nectar-thick liquid  Liquid Administration via Straw;Cup  Medication Administration Whole meds with liquid  Compensations Slow rate;Small sips/bites;Multiple dry swallows after each  bite/sip  Postural Changes and/or Swallow Maneuvers Seated upright 90 degrees     CHL IP OTHER RECOMMENDATIONS 04/03/2015  Recommended Consults (None)  Oral Care Recommendations Oral care BID  Other Recommendations Have oral suction available     CHL IP FOLLOW UP  RECOMMENDATIONS 04/03/2015  Follow up Recommendations Other (comment)     CHL IP FREQUENCY AND DURATION 04/03/2015  Speech Therapy Frequency (ACUTE ONLY) min 2x/week  Treatment  Duration 2 weeks     Pertinent Vitals/Pain NA    SLP Swallow Goals No flowsheet data found.  No flowsheet data found.    CHL IP REASON FOR REFERRAL 04/03/2015  Reason for Referral Objectively evaluate swallowing function     CHL IP ORAL PHASE 04/03/2015  Lips (None)  Tongue (None)  Mucous membranes (None)  Nutritional status (None)  Other (None)  Oxygen therapy (None)  Oral Phase Impaired  Oral - Pudding Teaspoon (None)  Oral - Pudding Cup (None)  Oral - Honey Teaspoon (None)  Oral - Honey Cup (None)  Oral - Honey Syringe (None)  Oral - Nectar Teaspoon (None)  Oral - Nectar Cup (None)  Oral - Nectar Straw Lingual/palatal residue  Oral - Nectar Syringe (None)  Oral - Ice Chips (None)  Oral - Thin Teaspoon (None)  Oral - Thin Cup (None)  Oral - Thin Straw Lingual/palatal residue  Oral - Thin Syringe (None)  Oral - Puree Delayed oral transit;Lingual/palatal residue  Oral - Mechanical Soft (None)  Oral - Regular Delayed oral transit;Other (Comment);Lingual/palatal  residue  Oral - Multi-consistency (None)  Oral - Pill (None)  Oral Phase - Comment (None)      CHL IP PHARYNGEAL PHASE 04/03/2015  Pharyngeal Phase Impaired  Pharyngeal - Pudding Teaspoon (None)  Penetration/Aspiration details (pudding teaspoon) (None)  Pharyngeal - Pudding Cup (None)  Penetration/Aspiration details (pudding cup) (None)  Pharyngeal - Honey Teaspoon (None)  Penetration/Aspiration details (honey teaspoon) (None)  Pharyngeal - Honey Cup (None)  Penetration/Aspiration details (honey cup) (None)  Pharyngeal - Honey Syringe (None)  Penetration/Aspiration details (honey syringe) (None)  Pharyngeal - Nectar Teaspoon (None)  Penetration/Aspiration details (nectar teaspoon) (None)  Pharyngeal - Nectar Cup Delayed swallow initiation;Premature spillage to  pyriform sinuses;Pharyngeal residue - valleculae;Penetration/Aspiration  during swallow  Penetration/Aspiration details (nectar cup) Material enters airway,  remains ABOVE vocal cords then ejected out   Pharyngeal - Nectar Straw (None)  Penetration/Aspiration details (nectar straw) (None)  Pharyngeal - Nectar Syringe (None)  Penetration/Aspiration details (nectar syringe) (None)  Pharyngeal - Ice Chips (None)  Penetration/Aspiration details (ice chips) (None)  Pharyngeal - Thin Teaspoon (None)  Penetration/Aspiration details (thin teaspoon) (None)  Pharyngeal - Thin Cup (None)  Penetration/Aspiration details (thin cup) (None)  Pharyngeal - Thin Straw Delayed swallow initiation;Premature spillage to  pyriform sinuses;Penetration/Aspiration before swallow  Penetration/Aspiration details (thin straw) Material enters airway, passes  BELOW cords and not ejected out despite cough attempt by patient  Pharyngeal - Thin Syringe (None)  Penetration/Aspiration details (thin syringe') (None)  Pharyngeal - Puree Delayed swallow initiation;Premature spillage to  valleculae;Pharyngeal residue - valleculae  Penetration/Aspiration details (puree) (None)  Pharyngeal - Mechanical Soft (None)  Penetration/Aspiration details (mechanical soft) (None)  Pharyngeal - Regular Delayed swallow initiation;Premature spillage to  valleculae;Pharyngeal residue - valleculae  Penetration/Aspiration details (regular) (None)  Pharyngeal - Multi-consistency (None)  Penetration/Aspiration details (multi-consistency) (None)  Pharyngeal - Pill Delayed swallow initiation  Penetration/Aspiration details (pill) (None)  Pharyngeal Comment (None)     CHL IP CERVICAL ESOPHAGEAL PHASE 04/03/2015  Cervical Esophageal Phase WFL  Pudding Teaspoon (None)  Pudding Cup (None)  Honey Teaspoon (None)  Honey Cup (None)  Honey Syringe (None)  Nectar Teaspoon (None)  Nectar Cup (None)  Nectar Straw (None)  Nectar Syringe (None)  Thin Teaspoon (None)  Thin Cup (None)  Thin Straw (None)  Thin Syringe (None)  Cervical Esophageal Comment (None)    No flowsheet data found.        Herbie [place], [place] [phone]  Lynann Beaver 04/03/2015, 3:08 PM      Oren Binet, MD  [facility] Pager:[phone]  If 7PM-7AM, please contact night-coverage [email or web address] Password [email or web address] 04/04/2015, 2:47 PM   LOS: 4 days

## 2015-04-04 NOTE — Progress Notes (Signed)
Patient Name: Bradley [name] Date of Encounter: 04/04/2015  Principal Problem:   Acute on chronic diastolic congestive heart failure Active Problems:   Diastolic dysfunction, left ventricle   Hypertension   UTI (lower urinary tract infection)   Chronic renal insufficiency, stage III (moderate)   Anemia-MGUS   Acute on chronic renal insufficiency   Demand ischemia   Dyslipidemia   Abnormal TSH   Severe aortic stenosis   Length of Stay: 4  SUBJECTIVE  He reports feeling well, but is clearly tachypneic, on O2 at 4L/min. Seems to be alert and oriented, difficult to understand at times due to aphasia/dysarthria. CXR looks worse, suggests pulmonary edema. Jump in creatinine after diuretics, now at new plateau at just under 2. CTA chest 2012 reviewed - there are very extensive coronary artery calcifications.  CURRENT MEDS . amLODipine  10 mg Oral Daily  . aspirin EC  81 mg Oral Daily  . cloNIDine  0.3 mg Transdermal Weekly  . enoxaparin (LOVENOX) injection  40 mg Subcutaneous Daily  . guaiFENesin  600 mg Oral BID  . hydrALAZINE  100 mg Oral Daily  . hydrALAZINE  50 mg Oral QHS  . ipratropium-albuterol  3 mL Nebulization TID  . labetalol  300 mg Oral BID  . piperacillin-tazobactam (ZOSYN)  IV  3.375 g Intravenous Q8H  . potassium chloride  20 mEq Oral Daily  . pravastatin  40 mg Oral Daily  . sodium chloride  3 mL Intravenous Q12H  . tamsulosin  0.4 mg Oral BID    OBJECTIVE   Intake/Output Summary (Last 24 hours) at 04/04/15 0909 Last data filed at 04/04/15 0725  Gross per 24 hour  Intake      0 ml  Output    200 ml  Net   -200 ml   Filed Weights   03/31/15 1907  Weight: 143 lb 4.8 oz (65 kg)    PHYSICAL EXAM Filed Vitals:   04/03/15 2249 04/04/15 0005 04/04/15 0618 04/04/15 0750  BP: 110/48 109/46 144/65   Pulse:      Temp: 98.6 F (37 C)  98.9 F (37.2 C)   TempSrc: Oral  Oral   Resp: 20  20   Height:      Weight:      SpO2: 99%  96% 96%   General:  Alert, oriented x3, no distress Head: no evidence of trauma, PERRL, EOMI, no exophtalmos or lid lag, no myxedema, no xanthelasma; normal ears, nose and oropharynx Neck: 6-8 cm elevated jugular venous pulsations and no hepatojugular reflux; brisk carotid pulses without delay and no carotid bruits, mass left neck that he reports is a lipoma, but could be a goiter Chest: bibasilar rales, no signs of consolidation by percussion or palpation, normal fremitus, symmetrical and full respiratory excursions Cardiovascular: normal position and quality of the apical impulse, regular rhythm, normal first and second heart sounds, no rubs, loud S3 gallop, mid peaking 3/6 systolic murmur Abdomen: no tenderness or distention, no masses by palpation, no abnormal pulsatility or arterial bruits, normal bowel sounds, no hepatosplenomegaly Extremities: no clubbing, cyanosis or edema; 2+ radial, ulnar and brachial pulses bilaterally; 2+ right femoral, posterior tibial and dorsalis pedis pulses; 2+ left femoral, posterior tibial and dorsalis pedis pulses; no subclavian or femoral bruits, a few small subcutaneous masses that he identifies as lipomas Neurological: grossly nonfocal, dysarthria  LABS  CBC  Recent Labs  04/03/15 0646 04/04/15 0608  WBC 6.6 6.3  HGB 9.6* 8.8*  HCT 29.6* 27.4*  MCV  83.6 83.0  PLT 163 102   Basic Metabolic Panel  Recent Labs  04/03/15 0646 04/04/15 0608  NA 143 141  K 4.5 4.7  CL 111 110  CO2 23 23  GLUCOSE 123* 112*  BUN 55* 64*  CREATININE 1.93* 1.96*  CALCIUM 9.7 9.5   Liver Function Tests No results for input(s): AST, ALT, ALKPHOS, BILITOT, PROT, ALBUMIN in the last 72 hours. No results for input(s): LIPASE, AMYLASE in the last 72 hours. Cardiac Enzymes  Recent Labs  04/01/15 1557 04/01/15 2120 04/02/15 0306  TROPONINI 0.19* 0.39* 0.51*  Thyroid Function Tests  Recent Labs  04/01/15 2120  TSH 0.028*    Radiology Studies Imaging results have been  reviewed and Dg Chest Port 1 View  04/04/2015   CLINICAL DATA:  Pneumonia  EXAM: PORTABLE CHEST - 1 VIEW  COMPARISON:  04/02/2015; 03/31/2015; 11/16/2014  FINDINGS: Grossly unchanged enlarged cardiac silhouette and mediastinal contours with atherosclerotic plaque within the thoracic aorta. Ill-defined heterogeneous opacities within the right mid lung have minimally worsened in the interval. Pulmonary vasculature is less distinct than present examination. No definite pleural effusion or pneumothorax. Unchanged bones. A minimal amount of enteric contrast is seen within the stomach and splenic flexure of the colon.  IMPRESSION: Findings suggestive of worsening pulmonary edema with worsening superimposed heterogeneous airspace opacities within the right mid lung with differential considerations including asymmetric alveolar pulmonary edema versus underlying infection. Continued attention on follow-up is recommended.   Electronically Signed   By: Sandi Mariscal M.D.   On: 04/04/2015 07:37   Dg Chest Port 1 View  04/02/2015   CLINICAL DATA:  Congestive heart failure.  Urinary tract infection.  EXAM: PORTABLE CHEST - 1 VIEW  COMPARISON:  03/31/2015  FINDINGS: New asymmetric airspace disease is seen throughout the right lung, and perhaps to a lesser degree in the retrocardiac left lower lobe. No definite pleural effusion seen on this portable exam. Cardiomegaly is stable.  IMPRESSION: New asymmetric airspace disease, which may be due to pneumonia or asymmetric pulmonary edema.  Stable cardiomegaly.   Electronically Signed   By: Earle Gell M.D.   On: 04/02/2015 14:05   Dg Swallowing Func-speech Pathology  04/03/2015     Objective Swallowing Evaluation:  "___   Pt presents with mild oral and moderate pharyngeal  phase dysphagia characterized by delayed oral transit, delayed swallow  initiation to the pyriforms. Pt aspirated thin liquids before the swallow  with sensation. Flash penetration noted with nectar-thick liquids.   Noted  lingual residue post-swallow, which then pools in valleculae, suspect due  to reduced bolus manipulation. Residue removed following cued dry swallow.  Recommend pt initiate dys 3/ nectar thick liquid diet; small sips; two  swallows following sips of liquids. Pt still at risk for aspiration given  delayed swallow initiation and base of tongue weakness. Speech will  follow-up with diet tolerance.       CHL IP TREATMENT RECOMMENDATION 04/03/2015  Treatment Plan Recommendations Therapy as outlined in treatment plan below      CHL IP DIET RECOMMENDATION 04/03/2015  Diet Recommendations Dysphagia 3 (Mechanical Soft);Nectar-thick liquid  Liquid Administration via Straw;Cup  Medication Administration Whole meds with liquid  Compensations Slow rate;Small sips/bites;Multiple dry swallows after each  bite/sip  Postural Changes and/or Swallow Maneuvers Seated upright 90 degrees  _____"    TELE NSR with very long first degree AV bock, Occasional PVCs  ECG NSR with very long first degree AV bock, QS V1-V3  ASSESSMENT AND PLAN  Acute on chronic diastolic heart failure with pulmonary edema, likely due to severe aortic stenosis with 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  Ideally, would proceed with workup to include cardiac cath and CT angiography of chest and iliac arterial access.  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  His daughter (main caregiver and spokesman) is not present at this time. Will have to engage in a face to face discussion when she is available.  For now, he clearly needs more diuresis, but have to  proceed relatively slowly due to aortic stenosis and renal failure.   Sanda Klein, MD, [facility] [phone] office [phone] pager 04/04/2015 9:09 AM

## 2015-04-04 NOTE — Progress Notes (Signed)
Speech Language Pathology Treatment: Dysphagia  Patient Details Name: Bradley [name] MRN: [id number] DOB: February 17, 1941 Today's Date: 04/04/2015 Time: 4784-1282 SLP Time Calculation (min) (ACUTE ONLY): 22 min  Assessment / Plan / Recommendation Clinical Impression  Skilled treatment session focused on addressing dysphagia goals.  Patient consumed Dys.3 textures and nectar-thick liquids via cup with Max multimodal cues to recall and utilize 2 swallows per bite/sip.  Patient with delayed cough x2 during times between swallows/while waiting for trigger of second swallow, which is indicative of penetration/aspiration of residue given yesterday's MBS findings.  Niece present and SLP educated her on need to remind patient to utilize safe swallow strategies with every sip.  SLP also posted recommendations over head of bed to assist with carryover.  Continue with current plan of care.    HPI HPI: 74 year old male with history of stroke with residual right-sided deficits who presents with difficulty ambulating due to right leg weakness and confusion. Chest x-ray with new asymmetric airspace disease, which may be due to pneumonia.  Daughter denies difficulty with liquids previously, but that chewing foods was difficult.  Daughter also reports history of thyroid issues; SLP able to observe enlarged area on right side of neck.     Pertinent Vitals Pain Assessment: No/denies pain  SLP Plan  Continue with current plan of care    Recommendations Diet recommendations: Dysphagia 3 (mechanical soft);Nectar-thick liquid Liquids provided via: Cup;Straw Medication Administration: Whole meds with liquid Supervision: Full supervision/cueing for compensatory strategies;Patient able to self feed Compensations: Slow rate;Small sips/bites;Multiple dry swallows after each bite/sip Postural Changes and/or Swallow Maneuvers: Seated upright 90 degrees              Oral Care Recommendations: Oral care BID Follow up  Recommendations: 24 hour supervision/assistance;Skilled Nursing facility Plan: Continue with current plan of care    GO    Carmelia Roller., CCC-SLP [phone]  [place] 04/04/2015, 11:02 AM

## 2015-04-04 NOTE — Progress Notes (Signed)
Physical Therapy Treatment Patient Details Name: Bradley [name] MRN: [id number] DOB: [birth year] Today's Date: 04/04/2015    History of Present Illness 74 year old male with history of stroke with residual right-sided deficits who presents with difficulty ambulating due to right leg weakness and confusion. Patient currently not able to provide history due to confusion, not oriented to time,place, not able to recall president's name. HPI obtained from talking to [name] and His daughter who is the main care giver. Daughter reported patient lives by self,amublate with walker, normally AAOX3, but when he gets uti he will get confused and weak, with more prominent right sided weakness, this morning daughter noticed the same, brought patient to the ED. Patient denis fever/abdominal pain,/nausea,/vomiting,/chest pain,/shortness of breath,/headaches/ dizziness/denies any urinary symptoms.    PT Comments    Progressed well over evaluation.  Quality of his gait is back toward baseline, but with stability and balance issues.  Follow Up Recommendations  SNF     Equipment Recommendations  None recommended by PT;Other (comment) (TBA next venue)    Recommendations for Other Services       Precautions / Restrictions Precautions Precautions: Fall    Mobility  Bed Mobility                  Transfers Overall transfer level: Needs assistance Equipment used: Rolling walker (2 wheeled) Transfers: Sit to/from Stand Sit to Stand: Min assist         General transfer comment: Still some difficulty coordinating transitions to stand, needing assist to come forward, cues for hand placement and stability due to wanting to list posteriorly.  Ambulation/Gait Ambulation/Gait assistance: Min assist;Mod assist Ambulation Distance (Feet): 24 Feet (x2 with RW) Assistive device: Rolling walker (2 wheeled) Gait Pattern/deviations: Step-through pattern Gait velocity: slower   General Gait Details: Although  gait has improved since evaluation, still rigid, paretic on R.  minimal w/shift so often has trouble advancing L LE. Family reports quality of gait is close to baseline, but stability is off.   Stairs            Wheelchair Mobility    Modified Rankin (Stroke Patients Only)       Balance Overall balance assessment: Needs assistance Sitting-balance support: No upper extremity supported Sitting balance-Leahy Scale: Fair Sitting balance - Comments: still doesn't accept challenge and easily can by unbalanced posteriorly.     Standing balance-Leahy Scale: Poor                      Cognition Arousal/Alertness: Awake/alert Behavior During Therapy: WFL for tasks assessed/performed Overall Cognitive Status: Impaired/Different from baseline         Following Commands: Follows one step commands inconsistently;Follows one step commands with increased time     Problem Solving: Slow processing      Exercises      General Comments General comments (skin integrity, edema, etc.): SpO2 on 6 L 98% on 4L 97% and on RA94/95%      Pertinent Vitals/Pain Pain Assessment: No/denies pain    Home Living                      Prior Function            PT Goals (current goals can now be found in the care plan section) Acute Rehab PT Goals Patient Stated Goal: Eventually get back home PT Goal Formulation: With patient/family Time For Goal Achievement: 04/16/15 Potential to Achieve Goals: Good Progress towards PT  goals: Progressing toward goals    Frequency  Min 3X/week    PT Plan Current plan remains appropriate    Co-evaluation             End of Session Equipment Utilized During Treatment: Oxygen Activity Tolerance: Patient tolerated treatment well Patient left: in chair;with call bell/phone within reach;with family/visitor present     Time: 9379-0240 PT Time Calculation (min) (ACUTE ONLY): 25 min  Charges:  $Gait Training: 8-22  mins $Therapeutic Activity: 8-22 mins                    G Codes:      Norm Wray, Tessie Fass 04/04/2015, 5:25 PM 04/04/2015  Donnella Sham, PT [phone]  (pager)

## 2015-04-04 NOTE — Consult Note (Signed)
CARDIOLOGY CONSULT NOTE  Patient ID: Bradley [name], MRN: [id number], DOB/AGE: [birth year] 73 y.o. Admit date: 03/31/2015 Date of Consult: 04/04/2015  Primary Physician: [name], MD Primary Cardiologist: Dr Harrington Challenger Referring Physician: Dr Sallyanne Kuster  Chief Complaint: Shortness of breath  Reason for Consultation: Severe aortic stenosis  HPI: This is a 74 year old gentleman who was hospitalized with confusion related to a urinary tract infection. Cardiology was initially consulted for an elevated troponin. He also was noted to have shortness of breath and hypoxemia. He was felt to have both acute diastolic heart failure and probable aspiration pneumonitis. He has improved symptomatically with diuresis and antibiotics. The patient has known severe aortic stenosis and I was asked to evaluate him for consideration of treatment options.  The patient has been followed by Dr. Harrington Challenger. An echocardiogram in December demonstrated severe aortic stenosis with a mean gradient of 58 mmHg and preserved LV systolic function. He had a pending office evaluation for further discussion, but was admitted to the hospital before his office visit occurred. The patient denies chest pain or pressure, dizziness/presyncope, or shortness of breath at home. However, he is admittedly quite sedentary. He has had multiple previous strokes. He lives at home with his wife and daughter who care for him. He ambulates with a walker. The patient denies any recent falls. He really does not get out of the house much at all.  The patient is retired. 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 He has no history of tobacco use. He is also followed for chronic kidney disease by Dr. Mercy Moore.  Medical History:  Past Medical History  Diagnosis Date  . Stroke   . Hypertension   . Hyperlipidemia   . Aortic stenosis   . CHF (congestive heart failure)   . MGUS (monoclonal gammopathy of unknown significance)   . Goiter   .  Rhabdomyolysis   . UTI (urinary tract infection) 05/09/2014      Surgical History:  Past Surgical History  Procedure Laterality Date  . Circumcision       Home Meds: Prior to Admission medications   Medication Sig Start Date End Date Taking? Authorizing Provider  amLODipine (NORVASC) 10 MG tablet Take 10 mg by mouth daily.   Yes Historical Provider, MD  aspirin 81 MG tablet Take 81 mg by mouth daily.   Yes Historical Provider, MD  cloNIDine (CATAPRES - DOSED IN MG/24 HR) 0.3 mg/24hr Place 1 patch onto the skin once a week. Sundays   Yes Historical Provider, MD  hydrALAZINE (APRESOLINE) 50 MG tablet Take 50-100 mg by mouth 2 (two) times daily. 100 mg in the morning and 50 mg in the evening   Yes Historical Provider, MD  irbesartan (AVAPRO) 300 MG tablet Take 300 mg by mouth daily.   Yes Historical Provider, MD  labetalol (NORMODYNE) 300 MG tablet Take 1 tablet (300 mg total) by mouth 2 (two) times daily. 05/09/14  Yes Marianne L York, PA-C  pravastatin (PRAVACHOL) 40 MG tablet Take 40 mg by mouth daily. 02/01/15  Yes Historical Provider, MD  Tamsulosin HCl (FLOMAX) 0.4 MG CAPS Take 0.4 mg by mouth 2 (two) times daily.    Yes Historical Provider, MD  albuterol (PROVENTIL) (2.5 MG/3ML) 0.083% nebulizer solution Take 3 mLs (2.5 mg total) by nebulization every 4 (four) hours as needed for wheezing or shortness of breath. 04/02/15   Shanker Kristeen Mans, MD  ciprofloxacin (CIPRO) 500 MG tablet Take 1 tablet (500 mg total) by mouth 2 (two)  times daily. Patient not taking: Reported on 12/11/2014 11/20/14   [name], MD  dextromethorphan-guaiFENesin [facility] DM) 30-600 MG per 12 hr tablet Take 1 tablet by mouth 2 (two) times daily as needed for cough. Patient not taking: Reported on 12/11/2014 11/16/14   [name], MD  furosemide (LASIX) 20 MG tablet Take 1 tablet (20 mg total) by mouth 2 (two) times daily. Start on 4/5-but make sure that your Primary MD checks a Renal function panel in next 2-3 days.  04/02/15   Shanker Kristeen Mans, MD  levofloxacin (LEVAQUIN) 500 MG tablet Take 1 tablet (500 mg total) by mouth every other day. Start on 4/6 04/04/15   Jonetta Osgood, MD    Inpatient Medications:  . amLODipine  10 mg Oral Daily  . aspirin EC  81 mg Oral Daily  . cloNIDine  0.3 mg Transdermal Weekly  . [START ON 04/05/2015] enoxaparin (LOVENOX) injection  30 mg Subcutaneous Daily  . guaiFENesin  600 mg Oral BID  . hydrALAZINE  100 mg Oral Daily  . hydrALAZINE  50 mg Oral QHS  . ipratropium-albuterol  3 mL Nebulization BID  . labetalol  300 mg Oral BID  . piperacillin-tazobactam (ZOSYN)  IV  3.375 g Intravenous Q8H  . potassium chloride  20 mEq Oral Daily  . pravastatin  40 mg Oral Daily  . sodium chloride  3 mL Intravenous Q12H  . tamsulosin  0.4 mg Oral BID      Allergies: No Known Allergies  History   Social History  . Marital Status: Legally Separated    Spouse Name: N/A  . Number of Children: N/A  . Years of Education: N/A   Occupational History  . Not on file.   Social History Main Topics  . Smoking status: Never Smoker   . Smokeless tobacco: Never Used  . Alcohol Use: No  . Drug Use: No  . Sexual Activity: Not on file   Other Topics Concern  . Not on file   Social History Narrative     Family Hx: no premature CAD in the family  Review of Systems: General: negative for chills, fever, night sweats or weight changes.  ENT: negative for rhinorrhea or epistaxis Cardiovascular: See history of present illness Dermatological: negative for rash Respiratory: Positive for cough and shortness of breath GI: negative for nausea, vomiting, diarrhea, bright red blood per rectum, melena, or hematemesis GU: Positive for urinary tract infection Neurologic: negative for visual changes, syncope, headache, or dizziness Heme: no easy bruising or bleeding Endo: negative for excessive thirst, thyroid disorder, or flushing Musculoskeletal: negative for joint pain or swelling,  negative for myalgias  All other systems reviewed and are otherwise negative except as noted above.  Physical Exam: Blood pressure 120/60, pulse 66, temperature 97.7 F (36.5 C), temperature source Oral, resp. rate 20, height 5\' 5"  (1.651 m), weight 143 lb 4.8 oz (65 kg), SpO2 100 %. Pt is alert and oriented, WD, WN, in no distress. HEENT: normal, speech is dysarthric Neck: JVP normal. Carotid upstrokes delayed Lungs: equal expansion, coarse bilaterally CV: Apex is discrete and nondisplaced, RRR with grade 3/6 harsh late-peaking systolic murmur with absent A2 Abd: soft, NT, +BS, no bruit, no hepatosplenomegaly Back: no CVA tenderness Ext: trace edema Skin: warm and dry without rash Neuro: CNII-XII intact             Strength intact = bilaterally    Labs:  Recent Labs  04/01/15 2120 04/02/15 0306  TROPONINI 0.39* 0.51*  Lab Results  Component Value Date   WBC 6.3 04/04/2015   HGB 8.8* 04/04/2015   HCT 27.4* 04/04/2015   MCV 83.0 04/04/2015   PLT 181 04/04/2015    Recent Labs Lab 04/01/15 0705  04/04/15 0608  NA 137  < > 141  K 3.4*  < > 4.7  CL 104  < > 110  CO2 22  < > 23  BUN 34*  < > 64*  CREATININE 1.51*  < > 1.96*  CALCIUM 9.6  < > 9.5  PROT 7.4  --   --   BILITOT 0.7  --   --   ALKPHOS 79  --   --   ALT 23  --   --   AST 22  --   --   GLUCOSE 123*  < > 112*  < > = values in this interval not displayed. Lab Results  Component Value Date   CHOL 198 03/11/2012   HDL 54 03/11/2012   LDLCALC 128* 03/11/2012   TRIG 79 03/11/2012   Lab Results  Component Value Date   DDIMER * 02/26/2011    2.62        AT THE INHOUSE ESTABLISHED CUTOFF VALUE OF 0.48 ug/mL FEU, THIS ASSAY HAS BEEN DOCUMENTED IN THE LITERATURE TO HAVE A SENSITIVITY AND NEGATIVE PREDICTIVE VALUE OF AT LEAST 98 TO 99%.  THE TEST RESULT SHOULD BE CORRELATED WITH AN ASSESSMENT OF THE CLINICAL PROBABILITY OF DVT / VTE.    Radiology/Studies:  Dg Chest Port 1 View  04/04/2015    CLINICAL DATA:  Pneumonia  EXAM: PORTABLE CHEST - 1 VIEW  COMPARISON:  04/02/2015; 03/31/2015; 11/16/2014  FINDINGS: Grossly unchanged enlarged cardiac silhouette and mediastinal contours with atherosclerotic plaque within the thoracic aorta. Ill-defined heterogeneous opacities within the right mid lung have minimally worsened in the interval. Pulmonary vasculature is less distinct than present examination. No definite pleural effusion or pneumothorax. Unchanged bones. A minimal amount of enteric contrast is seen within the stomach and splenic flexure of the colon.  IMPRESSION: Findings suggestive of worsening pulmonary edema with worsening superimposed heterogeneous airspace opacities within the right mid lung with differential considerations including asymmetric alveolar pulmonary edema versus underlying infection. Continued attention on follow-up is recommended.   Electronically Signed   By: Sandi Mariscal M.D.   On: 04/04/2015 07:37   Dg Chest Port 1 View  04/02/2015   CLINICAL DATA:  Congestive heart failure.  Urinary tract infection.  EXAM: PORTABLE CHEST - 1 VIEW  COMPARISON:  03/31/2015  FINDINGS: New asymmetric airspace disease is seen throughout the right lung, and perhaps to a lesser degree in the retrocardiac left lower lobe. No definite pleural effusion seen on this portable exam. Cardiomegaly is stable.  IMPRESSION: New asymmetric airspace disease, which may be due to pneumonia or asymmetric pulmonary edema.  Stable cardiomegaly.   Electronically Signed   By: Earle Gell M.D.   On: 04/02/2015 14:05   Dg Chest Port 1 View  03/31/2015   CLINICAL DATA:  Fatigue  EXAM: PORTABLE CHEST - 1 VIEW  COMPARISON:  11/16/2014  FINDINGS: Cardiac shadow is enlarged. Aortic calcifications are again noted. No focal infiltrate or sizable effusion is seen. No bony abnormality is noted.  IMPRESSION: No active disease.   Electronically Signed   By: Inez Catalina M.D.   On: 03/31/2015 15:56   Dg Swallowing Func-speech  Pathology  04/03/2015     Objective Swallowing Evaluation:    Patient Details  Name: Sherley Leser  [place] MRN: [id number] Date of Birth: 1941/01/13  Today's Date: 04/03/2015 Time: SLP Start Time (ACUTE ONLY): 1335-SLP Stop Time (ACUTE ONLY): 1405 SLP Time Calculation (min) (ACUTE ONLY): 30 min  Past Medical History:  Past Medical History  Diagnosis Date  . Stroke   . Hypertension   . Hyperlipidemia   . Aortic stenosis   . CHF (congestive heart failure)   . MGUS (monoclonal gammopathy of unknown significance)   . Goiter   . Rhabdomyolysis   . UTI (urinary tract infection) 05/09/2014   Past Surgical History:  Past Surgical History  Procedure Laterality Date  . Circumcision     HPI:  HPI: 74 year old male with history of stroke with residual right-sided  deficits who presents with difficulty ambulating due to right leg weakness  and confusion. Chest x-ray with new asymmetric airspace disease, which may  be due to pneumonia.  Daughter denies difficulty with liquids previously,  but that chewing foods was difficult.  Daughter also reports history of  thyroid issues; SLP able to observe enlarged area on right side of neck.     No Data Recorded  Assessment / Plan / Recommendation CHL IP CLINICAL IMPRESSIONS 04/03/2015  Dysphagia Diagnosis Mild oral phase dysphagia;Moderate pharyngeal phase  dysphagia  Clinical impression Pt presents with mild oral and moderate pharyngeal  phase dysphagia characterized by delayed oral transit, delayed swallow  initiation to the pyriforms. Pt aspirated thin liquids before the swallow  with sensation. Flash penetration noted with nectar-thick liquids.  Noted  lingual residue post-swallow, which then pools in valleculae, suspect due  to reduced bolus manipulation. Residue removed following cued dry swallow.  Recommend pt initiate dys 3/ nectar thick liquid diet; small sips; two  swallows following sips of liquids. Pt still at risk for aspiration given  delayed swallow initiation and base of tongue weakness.  Speech will  follow-up with diet tolerance.       CHL IP TREATMENT RECOMMENDATION 04/03/2015  Treatment Plan Recommendations Therapy as outlined in treatment plan below      CHL IP DIET RECOMMENDATION 04/03/2015  Diet Recommendations Dysphagia 3 (Mechanical Soft);Nectar-thick liquid  Liquid Administration via Straw;Cup  Medication Administration Whole meds with liquid  Compensations Slow rate;Small sips/bites;Multiple dry swallows after each  bite/sip  Postural Changes and/or Swallow Maneuvers Seated upright 90 degrees     CHL IP OTHER RECOMMENDATIONS 04/03/2015  Recommended Consults (None)  Oral Care Recommendations Oral care BID  Other Recommendations Have oral suction available     CHL IP FOLLOW UP RECOMMENDATIONS 04/03/2015  Follow up Recommendations Other (comment)     CHL IP FREQUENCY AND DURATION 04/03/2015  Speech Therapy Frequency (ACUTE ONLY) min 2x/week  Treatment Duration 2 weeks     Pertinent Vitals/Pain NA    SLP Swallow Goals No flowsheet data found.  No flowsheet data found.    CHL IP REASON FOR REFERRAL 04/03/2015  Reason for Referral Objectively evaluate swallowing function     CHL IP ORAL PHASE 04/03/2015  Lips (None)  Tongue (None)  Mucous membranes (None)  Nutritional status (None)  Other (None)  Oxygen therapy (None)  Oral Phase Impaired  Oral - Pudding Teaspoon (None)  Oral - Pudding Cup (None)  Oral - Honey Teaspoon (None)  Oral - Honey Cup (None)  Oral - Honey Syringe (None)  Oral - Nectar Teaspoon (None)  Oral - Nectar Cup (None)  Oral - Nectar Straw Lingual/palatal residue  Oral - Nectar Syringe (None)  Oral - Ice Chips (None)  Oral - Thin  Teaspoon (None)  Oral - Thin Cup (None)  Oral - Thin Straw Lingual/palatal residue  Oral - Thin Syringe (None)  Oral - Puree Delayed oral transit;Lingual/palatal residue  Oral - Mechanical Soft (None)  Oral - Regular Delayed oral transit;Other (Comment);Lingual/palatal  residue  Oral - Multi-consistency (None)  Oral - Pill (None)  Oral Phase - Comment (None)      CHL  IP PHARYNGEAL PHASE 04/03/2015  Pharyngeal Phase Impaired  Pharyngeal - Pudding Teaspoon (None)  Penetration/Aspiration details (pudding teaspoon) (None)  Pharyngeal - Pudding Cup (None)  Penetration/Aspiration details (pudding cup) (None)  Pharyngeal - Honey Teaspoon (None)  Penetration/Aspiration details (honey teaspoon) (None)  Pharyngeal - Honey Cup (None)  Penetration/Aspiration details (honey cup) (None)  Pharyngeal - Honey Syringe (None)  Penetration/Aspiration details (honey syringe) (None)  Pharyngeal - Nectar Teaspoon (None)  Penetration/Aspiration details (nectar teaspoon) (None)  Pharyngeal - Nectar Cup Delayed swallow initiation;Premature spillage to  pyriform sinuses;Pharyngeal residue - valleculae;Penetration/Aspiration  during swallow  Penetration/Aspiration details (nectar cup) Material enters airway,  remains ABOVE vocal cords then ejected out  Pharyngeal - Nectar Straw (None)  Penetration/Aspiration details (nectar straw) (None)  Pharyngeal - Nectar Syringe (None)  Penetration/Aspiration details (nectar syringe) (None)  Pharyngeal - Ice Chips (None)  Penetration/Aspiration details (ice chips) (None)  Pharyngeal - Thin Teaspoon (None)  Penetration/Aspiration details (thin teaspoon) (None)  Pharyngeal - Thin Cup (None)  Penetration/Aspiration details (thin cup) (None)  Pharyngeal - Thin Straw Delayed swallow initiation;Premature spillage to  pyriform sinuses;Penetration/Aspiration before swallow  Penetration/Aspiration details (thin straw) Material enters airway, passes  BELOW cords and not ejected out despite cough attempt by patient  Pharyngeal - Thin Syringe (None)  Penetration/Aspiration details (thin syringe') (None)  Pharyngeal - Puree Delayed swallow initiation;Premature spillage to  valleculae;Pharyngeal residue - valleculae  Penetration/Aspiration details (puree) (None)  Pharyngeal - Mechanical Soft (None)  Penetration/Aspiration details (mechanical soft) (None)  Pharyngeal - Regular Delayed  swallow initiation;Premature spillage to  valleculae;Pharyngeal residue - valleculae  Penetration/Aspiration details (regular) (None)  Pharyngeal - Multi-consistency (None)  Penetration/Aspiration details (multi-consistency) (None)  Pharyngeal - Pill Delayed swallow initiation  Penetration/Aspiration details (pill) (None)  Pharyngeal Comment (None)     CHL IP CERVICAL ESOPHAGEAL PHASE 04/03/2015  Cervical Esophageal Phase WFL  Pudding Teaspoon (None)  Pudding Cup (None)  Honey Teaspoon (None)  Honey Cup (None)  Honey Syringe (None)  Nectar Teaspoon (None)  Nectar Cup (None)  Nectar Straw (None)  Nectar Syringe (None)  Thin Teaspoon (None)  Thin Cup (None)  Thin Straw (None)  Thin Syringe (None)  Cervical Esophageal Comment (None)    No flowsheet data found.        Herbie [place], [place] [phone]  Lynann Beaver 04/03/2015, 3:08 PM    Cardiac Studies: 2D Echo 12/18/2014: Left ventricle: The cavity size was normal. Wall thickness was increased in a pattern of moderate LVH. Systolic function was normal. The estimated ejection fraction was in the range of 55% to 60%.  ------------------------------------------------------------------- Aortic valve:  Severely calcified leaflets. Doppler:  There was severe stenosis.  There was mild regurgitation.  VTI ratio of LVOT to aortic valve: 0.2. Valve area (VTI): 0.91 cm^2. Indexed valve area (VTI): 0.52 cm^2/m^2. Mean velocity ratio of LVOT to aortic valve: 0.2. Valve area (Vmean): 0.89 cm^2. Indexed valve area (Vmean): 0.51 cm^2/m^2.  Mean gradient (S): 58 mm Hg.  ------------------------------------------------------------------- Aorta: The aorta was normal, not dilated, and non-diseased.  ------------------------------------------------------------------- Mitral valve:  Mildly thickened leaflets . Doppler: There was trivial regurgitation.  Peak  gradient (D): 6 mm  Hg.  ------------------------------------------------------------------- Left atrium: The atrium was mildly dilated.  ------------------------------------------------------------------- Atrial septum: No subcostal images cannot comment on ASD/PFO.  ------------------------------------------------------------------- Right ventricle: The cavity size was normal. Wall thickness was normal. Systolic function was normal.  ------------------------------------------------------------------- Pulmonic valve:  Structurally normal valve.  Cusp separation was normal. Doppler: Transvalvular velocity was within the normal range. There was trivial regurgitation.  ------------------------------------------------------------------- Tricuspid valve:  Structurally normal valve.  Leaflet separation was normal. Doppler: Transvalvular velocity was within the normal range. There was mild regurgitation.  ------------------------------------------------------------------- Right atrium: The atrium was normal in size.  ------------------------------------------------------------------- Pericardium: The pericardium was normal in appearance.  ASSESSMENT AND PLAN:  74 year old male with stage D, critical symptomatic aortic stenosis. While the patient does not have classic symptoms of exertional chest pain, shortness of breath, or lightheadedness/syncope, his markedly reduced functional capacity likely calcium below asymptomatic threshold. However, he does have evidence of congestive heart failure during this current hospital admission.  I have personally reviewed the patients echocardiogram images from December 2015. He has preserved LV systolic function with LVH and marked restriction, calcification, and thickening of all 3 aortic valve leaflets. He has hemodynamic evidence of severe/critical aortic stenosis. There is no other significant valvular disease.  I have reviewed the natural history of  aortic stenosis with the patient, his wife, and his daughter who are at the bedside today. We have discussed the limitations of medical therapy and the poor prognosis associated with symptomatic aortic stenosis. We have also reviewed potential treatment options, including palliative medical therapy, conventional surgical aortic valve replacement, and transcatheter aortic valve replacement. We discussed treatment options in the context of this patient's specific comorbid medical conditions. The patient's comorbid medical conditions include stage III chronic kidney disease, diastolic heart failure, chronic anemia, multiple strokes with residual dysarthria, and markedly reduced functional capacity. I think it is clear that he is not a candidate for conventional surgical aortic valve replacement. We discussed and contrasted potential treatment options of palliative medical therapy versus transcatheter aortic valve replacement. Transcatheter aortic valve replacement would require extensive evaluation including diagnostic cardiac catheterization, CT angiography of the abdomen and pelvis, and gated cardiac CT scan of the heart. All of these studies would require ionated contrast and potentially exacerbate the patient's kidney disease. I think in the setting of his poor functional capacity, the risk/benefit of pursuing further treatment for his aortic stenosis is unfavorable. There is a high likelihood that his functional capacity would not be significantly improved. The patient's family understands that without treatment, he will likely develop progressive congestive heart failure and that is overall prognosis is poor. The patient and his family strongly favor a conservative approach with palliative medical therapy.  Please feel free to call if any questions arise. thanks  Signed, Sherren Mocha MD, [facility] 04/04/2015, 4:29 PM

## 2015-04-05 LAB — CBC
HCT: 30.9 % — ABNORMAL LOW (ref 39.0–52.0)
HEMOGLOBIN: 9.8 g/dL — AB (ref 13.0–17.0)
MCH: 26.6 pg (ref 26.0–34.0)
MCHC: 31.7 g/dL (ref 30.0–36.0)
MCV: 83.7 fL (ref 78.0–100.0)
PLATELETS: 209 10*3/uL (ref 150–400)
RBC: 3.69 MIL/uL — ABNORMAL LOW (ref 4.22–5.81)
RDW: 14 % (ref 11.5–15.5)
WBC: 6 10*3/uL (ref 4.0–10.5)

## 2015-04-05 LAB — BASIC METABOLIC PANEL
ANION GAP: 11 (ref 5–15)
BUN: 59 mg/dL — ABNORMAL HIGH (ref 6–23)
CALCIUM: 9.9 mg/dL (ref 8.4–10.5)
CO2: 19 mmol/L (ref 19–32)
Chloride: 116 mmol/L — ABNORMAL HIGH (ref 96–112)
Creatinine, Ser: 1.76 mg/dL — ABNORMAL HIGH (ref 0.50–1.35)
GFR calc Af Amer: 42 mL/min — ABNORMAL LOW (ref >90)
GFR calc non Af Amer: 37 mL/min — ABNORMAL LOW (ref >90)
Glucose, Bld: 112 mg/dL — ABNORMAL HIGH (ref 70–99)
Potassium: 5 mmol/L (ref 3.5–5.1)
Sodium: 146 mmol/L — ABNORMAL HIGH (ref 135–145)

## 2015-04-05 MED ORDER — HYDRALAZINE HCL 50 MG PO TABS: 100.0000 mg | ORAL_TABLET | Freq: Every morning | ORAL | Status: DC

## 2015-04-05 MED ORDER — AMOXICILLIN-POT CLAVULANATE 500-125 MG PO TABS: 1.0000 | ORAL_TABLET | Freq: Two times a day (BID) | ORAL | Status: DC

## 2015-04-05 MED ORDER — GUAIFENESIN ER 600 MG PO TB12
600.0000 mg | ORAL_TABLET | Freq: Two times a day (BID) | ORAL | Status: DC
Start: 2015-04-05 — End: 2015-06-19

## 2015-04-05 MED ORDER — FUROSEMIDE 20 MG PO TABS: 40.0000 mg | ORAL_TABLET | Freq: Two times a day (BID) | ORAL | Status: DC

## 2015-04-05 MED ORDER — AMOXICILLIN-POT CLAVULANATE 500-125 MG PO TABS
1.0000 | ORAL_TABLET | Freq: Two times a day (BID) | ORAL | Status: DC
  Administered 2015-04-05: 500 mg via ORAL
  Filled 2015-04-05 (×2): qty 1

## 2015-04-05 MED ORDER — FUROSEMIDE 40 MG PO TABS
40.0000 mg | ORAL_TABLET | Freq: Two times a day (BID) | ORAL | Status: DC
  Administered 2015-04-05: 40 mg via ORAL
  Filled 2015-04-05 (×4): qty 1

## 2015-04-05 MED ORDER — POTASSIUM CHLORIDE CRYS ER 20 MEQ PO TBCR: 20.0000 meq | EXTENDED_RELEASE_TABLET | Freq: Every day | ORAL | Status: DC

## 2015-04-05 MED ORDER — IPRATROPIUM-ALBUTEROL 0.5-2.5 (3) MG/3ML IN SOLN: 3.0000 mL | Freq: Two times a day (BID) | RESPIRATORY_TRACT | Status: AC

## 2015-04-05 MED ORDER — HYDRALAZINE HCL 50 MG PO TABS: 50.0000 mg | ORAL_TABLET | Freq: Every day | ORAL | Status: DC

## 2015-04-05 NOTE — Progress Notes (Signed)
Attempted to call report again to [facility] per social work, left voicemail to call me back at my direct number for report.

## 2015-04-05 NOTE — Progress Notes (Signed)
Speech Language Pathology Treatment: Dysphagia  Patient Details Name: COLA HIGHFILL MRN: [id number] DOB: 14-Feb-1941 Today's Date: 04/05/2015 Time: 1151-1200 SLP Time Calculation (min) (ACUTE ONLY): 9 min  Assessment / Plan / Recommendation Clinical Impression  Skilled treatment session focused on addressing dysphagia goals/education prior to discharge.  SLP facilitated session with discussion regarding diet restrictions and swallow compensatory strategies.  SLP also provided a handout and teach back opportunities for how to thicken liquids to a nectar-thick consistency; daughter verbalized understanding of information and reported that patient is requiring less assist to recall need for swallows per sip/bite.  However, patient continues to need Mod cues to verbally recall information and therefore, full supervision and follow up skilled SLP services continue to be warranted at next level of care.    HPI HPI: 74 year old male with history of stroke with residual right-sided deficits who presents with difficulty ambulating due to right leg weakness and confusion. Chest x-ray with new asymmetric airspace disease, which may be due to pneumonia.  Daughter denies difficulty with liquids previously, but that chewing foods was difficult.  Daughter also reports history of thyroid issues; SLP able to observe enlarged area on right side of neck.     Pertinent Vitals Pain Assessment: No/denies pain  SLP Plan  Continue with current plan of care    Recommendations Diet recommendations: Dysphagia 3 (mechanical soft);Nectar-thick liquid Liquids provided via: Cup;Straw Medication Administration: Whole meds with liquid Supervision: Full supervision/cueing for compensatory strategies;Patient able to self feed Compensations: Slow rate;Small sips/bites;Multiple dry swallows after each bite/sip Postural Changes and/or Swallow Maneuvers: Seated upright 90 degrees              Oral Care Recommendations: Oral care  BID Follow up Recommendations: 24 hour supervision/assistance;Skilled Nursing facility Plan: Continue with current plan of care    GO    Carmelia Roller., CCC-SLP [phone]  [place] 04/05/2015, 1:15 PM

## 2015-04-05 NOTE — Progress Notes (Signed)
Patient to be dc'd around 1400 per social work.

## 2015-04-05 NOTE — Care Management Note (Signed)
    Page 1 of 2   04/05/2015     12:01:44 PM CARE MANAGEMENT NOTE 04/05/2015  Patient:  Bradley [name], Bradley [name]   Account Number:  [id number]  Date Initiated:  04/02/2015  Documentation initiated by:  [facility]  Subjective/Objective Assessment:   admitted with weakness, UTI,confusion     Action/Plan:   plan HH- RN, PT, aide   Anticipated DC Date:  04/05/2015   Anticipated DC Plan:  SKILLED NURSING FACILITY  In-house referral  Clinical Social Worker      DC Forensic scientist  CM consult      [facility] Choice  HOME HEALTH  DURABLE MEDICAL EQUIPMENT   Choice offered to / List presented to:  C-4 Adult Children   DME arranged  NEBULIZER MACHINE      DME agency  [facility].        Status of service:  Completed, signed off Medicare Important Message given?  YES (If response is "NO", the following Medicare IM given date fields will be blank) Date Medicare IM given:  04/03/2015 Medicare IM given by:  [facility] Date Additional Medicare IM given:   Additional Medicare IM given by:    Discharge Disposition:  [place]  Per UR Regulation:  Reviewed for med. necessity/level of care/duration of stay  If discussed at [place] of Stay Meetings, dates discussed:    Comments:  04/05/15 [place], BSN [phone] patient for dc to snf today, NCM gave patient's daughter the order and face sheet to get the neb machine at the [facility] store.  04/03/2015 1720 Plan dc to SNF, CSW following for SNF. Will notify [place] on 04/04/2015 of dc to SNF. Jonnie Finner RN CCM Case Mgmt phone [phone]  04/02/15 Spoke with patient, wife and daughter, who stated that she has POA, about HHC. They selected [facility]. Contacted Miranda at [facility] and set up [facility] [place] and aide. Patient uses a rolling walker at home.

## 2015-04-05 NOTE — Progress Notes (Addendum)
NURSING PROGRESS NOTE  MARKISE HAYMER [id number] Discharge Data: 04/05/2015 2:49 PM Attending Provider: No att. providers found MIW:[id number],YQMGN Marigene Ehlers, MD   Thana Ates to be D/C'd Rehab (daughter bringing patient to rehab per her own request) per MD order.    All IV's will be discontinued and monitored for bleeding.  All belongings will be returned to patient for patient to take home. Patient dc'd via wheelchair with volunteer services and daughter. All paperwork given to daughter in beige folder, daughter verbally acknowledged she understood to give this paperwork to the rehab facility upon arrival. Hard copies of prescription included with paperwork.  Last Documented Vital Signs:  Blood pressure 161/77, pulse 71, temperature 98 F (36.7 C), temperature source Oral, resp. rate 20, height 5\' 5"  (1.651 m), weight 65 kg (143 lb 4.8 oz), SpO2 100 %.  Hendricks Limes RN, BS, BSN

## 2015-04-05 NOTE — Clinical Documentation Improvement (Signed)
Medicare rules require specification as to whether an inpatient diagnosis was present at the time of admission.    Please clarify if the following diagnosis Pneumonia  was:     Marland Kitchen Present at the time of admission . NOT present at the time of inpatient admission and it developed during the inpatient stay . Unable to clinically determine whether the condition was present on admission. . Documentation insufficient to determine if condition was present at the time of inpatient admission  Supporting Information:   PROGRESS 04/04/2015  Active Problems:.Patient was empirically started on levofloxacin-however changed to Zosyn given aspiration pneumonia. .  Acute hypoxic respiratory failure: Suspect that this is more from aspiration pneumonia with some element of acute diastolic heart failure. .  Suspected aspiration pneumonia: Given history of CVA, and unilateral right-sided infiltrates-suspect aspiration pneumonia. . Acute diastolic heart failure: Given numerous doses of prn Lasix, however suspect that respiratory failure is multifactorial from both aspiration pneumonia and acute diastolic heart failure. .  Acute on chronic kidney disease stage III: Creatinine slowly increasing, suspect multifactorial etiology-combination of prerenal azotemia from aspiration pneumonia/UTI/Lasix. CLINICAL DATA:.  Pneumonia  EXAM: PORTABLE CHEST - 1 VIEW  COMPARISON:  04/02/2015; 03/31/2015; 11/16/2014  FINDINGS: Grossly unchanged enlarged cardiac silhouette and mediastinal contours with atherosclerotic plaque within the thoracic aorta. CLINICAL DATA:.  IMPRESSION: New asymmetric airspace disease, which may be due to pneumonia or asymmetric pulmonary edema. CLINICAL DATA:.Chest x-ray with new asymmetric airspace disease, which may  be due to pneumonia.  PROGRESS 04/04/2015  .Chest x-ray with new asymmetric airspace disease, which may be due to pneumonia.  PROGRESS 04/04/2015   CLINICAL DATA:.  Pneumonia  EXAM:  PORTABLE CHEST - 1 VIEW  COMPARISON:  04/02/2015; 03/31/2015; 11/16/2014  FINDINGS: Grossly unchanged enlarged cardiac silhouette and mediastinal contours with atherosclerotic plaque within the thoracic aorta. CLINICAL DATA:.  IMPRESSION: New asymmetric airspace disease, which may be due to pneumonia or asymmetric pulmonary edema. DG CHEST PORT 1 VIEW 04/04/2015   CLINICAL DATA: Pneumonia PROGRESS 04/03/2015   Active Problems:.Patient was empirically started on levofloxacin-however changed to Zosyn given aspiration pneumonia. .  Acute hypoxic respiratory failure: Suspect that this is more from aspiration pneumonia, continue oxygen as needed, supportive care and IV Zosyn. .  Suspected aspiration pneumonia: Given history of CVA, and unilateral right-sided infiltrates-suspect aspiration pneumonia. . Acute diastolic heart failure: Given numerous doses of Lasix, however suspect that respiratory failure was probably more from aspiration pneumonia. .  Acute on chronic kidney disease stage III: Creatinine slowly increasing, suspect multifactorial etiology-combination of prerenal azotemia from aspiration pneumonia/UTI/Lasix. CLINICAL DATA:.  IMPRESSION: New asymmetric airspace disease, which may be due to pneumonia or asymmetric pulmonary edema. CLINICAL DATA:.Chest x-ray with new asymmetric airspace disease, which may  be due to pneumonia.  PROGRESS 04/03/2015  PLAN:.Of aspiration pneumonia).   DG SWALLOWING [facility] PATHOLOGY 04/03/2015    Chest x-ray with new asymmetric airspace disease, which may be due to pneumonia.  Eval 04/03/2015   HPI:  Chest x-ray with new asymmetric airspace disease, which may be due to pneumonia.  Chest x-ray with new asymmetric airspace disease, which may be due to pneumonia.  PROGRESS 04/02/2015   Active Problems:  Subsequently since patient developed hypoxia, a chest x-ray shows right-sided infiltrates-given history of CVA-some suspicion for aspiration pneumonia,  we have now switched him to Zosyn and discontinued levofloxacin.  Acute hypoxic respiratory failure: Unclear whether this is from pneumonia or from unilateral pulmonary edema.  Suspected aspiration pneumonia: Given history of CVA, and unilateral right-sided  infiltrates-some suspicion for aspiration pneumonia.  Not sure at this time whether unilateral right-sided infiltrates is pulmonary edema or aspiration pneumonia. CLINICAL DATA:  IMPRESSION: New asymmetric airspace disease, which may be due to pneumonia or asymmetric pulmonary edema.  DG CHEST PORT 1 VIEW 04/02/2015    IMPRESSION: New asymmetric airspace disease, which may be due to pneumonia or asymmetric pulmonary edema.     Thank You, Serena Colonel ,RN Clinical Documentation Specialist:  [place] Information Management

## 2015-04-05 NOTE — Clinical Social Work Placement (Signed)
Clinical Social Work Department CLINICAL SOCIAL WORK PLACEMENT NOTE 04/05/2015  Patient:  Bradley [name], Bradley [name]  Account Number:  [id number] Admit date:  03/31/2015  Clinical Social Worker:  Kingsley Spittle, CLINICAL SOCIAL WORKER  Date/time:  04/03/2015 04:32 PM  Clinical Social Work is seeking post-discharge placement for this patient at the following level of care:   SKILLED NURSING   (*CSW will update this form in Epic as items are completed)   04/03/2015  Patient/family provided with [place] Department of Clinical Social Work's list of facilities offering this level of care within the geographic area requested by the patient (or if unable, by the patient's family).  04/03/2015  Patient/family informed of their freedom to choose among providers that offer the needed level of care, that participate in Medicare, Medicaid or managed care program needed by the patient, have an available bed and are willing to accept the patient.  04/03/2015  Patient/family informed of [facility]' ownership interest in [facility], as well as of the fact that they are under no obligation to receive care at this facility.  PASARR submitted to EDS on 04/05/2015 PASARR number received on 04/05/2015  FL2 transmitted to all facilities in geographic area requested by pt/family on  04/03/2015 FL2 transmitted to all facilities within larger geographic area on   Patient informed that his/her managed care company has contracts with or will negotiate with  certain facilities, including the following:     Patient/family informed of bed offers received:  04/03/2015 Patient chooses bed at [place] Physician recommends and patient chooses bed at    Patient to be transferred to [facility] on  04/05/2015 Patient to be transferred to facility by Patient's Family Patient and family notified of transfer on 04/05/2015 Name of family member notified:  Sharyn Lull  The following physician request were entered  in Epic:   Additional Comments:   Per MD patient ready for DC to [facility]. RN, patient, patient's family, and facility aware of DC. Patient to be transported to facility by Patient's Family. DC Packet on chart along with number for report. BSW intern signing off at this time.  Kingsley Spittle, [place] Intern, [phone]

## 2015-04-05 NOTE — Progress Notes (Signed)
Attempted to call report to [phone] per social work, left voicemail to call me back at my direct number for report.

## 2015-04-05 NOTE — Consult Note (Signed)
Bradley Berry, EdD

## 2015-04-05 NOTE — Discharge Summary (Signed)
PATIENT DETAILS Name: Bradley [name] Age: 74 y.o. Sex: male Date of Birth: 05-Dec-1941 MRN: [id number]. Admitting Physician: Florencia Reasons, MD SAY:[id number],UXNAT Marigene Ehlers, MD  Admit Date: 03/31/2015 Discharge date: 04/05/2015  Recommendations for Outpatient Follow-up:  1. Consult palliative care while at SNF-critical aortic stenosis-not a candidate for treatment. 2. Check CBC and chemistries in 1 week 3. Repeat chest x-ray in 16 weeks document resolution of infiltrate 4. Please ensure follow-up with Dr. Dwyane Dee (Endocrine)-for mild hyperthyroidism-has appointment on 4/15 already scheduled  PRIMARY DISCHARGE DIAGNOSIS:  Principal Problem:   Acute on chronic diastolic congestive heart failure Active Problems:   Diastolic dysfunction, left ventricle   Hypertension   UTI (lower urinary tract infection)   Chronic renal insufficiency, stage III (moderate)   Anemia-MGUS   Acute on chronic renal insufficiency   Demand ischemia   Dyslipidemia   Abnormal TSH   Severe aortic stenosis   Elevated troponin I level   PNA (pneumonia)      PAST MEDICAL HISTORY: Past Medical History  Diagnosis Date  . Stroke   . Hypertension   . Hyperlipidemia   . Aortic stenosis   . CHF (congestive heart failure)   . MGUS (monoclonal gammopathy of unknown significance)   . Goiter   . Rhabdomyolysis   . UTI (urinary tract infection) 05/09/2014    DISCHARGE MEDICATIONS: Current Discharge Medication List    START taking these medications   Details  albuterol (PROVENTIL) (2.5 MG/3ML) 0.083% nebulizer solution Take 3 mLs (2.5 mg total) by nebulization every 4 (four) hours as needed for wheezing or shortness of breath. Qty: 75 mL, Refills: 0    amoxicillin-clavulanate (AUGMENTIN) 500-125 MG per tablet Take 1 tablet (500 mg total) by mouth every 12 (twelve) hours. 3 more days from 04/05/15 and then stop    guaiFENesin (MUCINEX) 600 MG 12 hr tablet Take 1 tablet (600 mg total) by mouth 2 (two) times daily.      ipratropium-albuterol (DUONEB) 0.5-2.5 (3) MG/3ML SOLN Take 3 mLs by nebulization 2 (two) times daily. Qty: 360 mL    potassium chloride SA (K-DUR,KLOR-CON) 20 MEQ tablet Take 1 tablet (20 mEq total) by mouth daily.      CONTINUE these medications which have CHANGED   Details  furosemide (LASIX) 20 MG tablet Take 2 tablets (40 mg total) by mouth 2 (two) times daily. Start on 4/5-but make sure that your Primary MD checks a Renal function panel in next 2-3 days. Qty: 30 tablet, Refills: 0    !! hydrALAZINE (APRESOLINE) 50 MG tablet Take 2 tablets (100 mg total) by mouth every morning.    !! hydrALAZINE (APRESOLINE) 50 MG tablet Take 1 tablet (50 mg total) by mouth at bedtime.     !! - Potential duplicate medications found. Please discuss with provider.    CONTINUE these medications which have NOT CHANGED   Details  amLODipine (NORVASC) 10 MG tablet Take 10 mg by mouth daily.    aspirin 81 MG tablet Take 81 mg by mouth daily.    cloNIDine (CATAPRES - DOSED IN MG/24 HR) 0.3 mg/24hr Place 1 patch onto the skin once a week. Sundays    labetalol (NORMODYNE) 300 MG tablet Take 1 tablet (300 mg total) by mouth 2 (two) times daily.    pravastatin (PRAVACHOL) 40 MG tablet Take 40 mg by mouth daily. Refills: 1    Tamsulosin HCl (FLOMAX) 0.4 MG CAPS Take 0.4 mg by mouth 2 (two) times daily.       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 Found to have a UTI and admitted for further evaluation and treatment.   CONSULTATIONS:    cardiology  PERTINENT RADIOLOGIC STUDIES: Dg Chest Port 1 View  04/04/2015   CLINICAL DATA:  Pneumonia  EXAM: PORTABLE CHEST - 1 VIEW  COMPARISON:  04/02/2015; 03/31/2015; 11/16/2014  FINDINGS: Grossly unchanged enlarged cardiac silhouette and mediastinal contours with atherosclerotic plaque within the thoracic aorta. Ill-defined heterogeneous opacities within the right mid lung have minimally worsened in the interval. Pulmonary vasculature is less distinct than present examination. No definite pleural effusion or pneumothorax. Unchanged bones. A minimal amount of enteric contrast is seen within the stomach and splenic flexure of the colon.  IMPRESSION: Findings suggestive of worsening pulmonary edema with worsening superimposed heterogeneous airspace opacities within the right mid lung with differential considerations including asymmetric alveolar pulmonary edema versus underlying infection. Continued attention on follow-up is recommended.   Electronically Signed   By: Sandi Mariscal M.D.   On: 04/04/2015 07:37   Dg Chest Port 1 View  04/02/2015   CLINICAL DATA:  Congestive heart failure.  Urinary tract infection.  EXAM: PORTABLE CHEST - 1 VIEW  COMPARISON:  03/31/2015  FINDINGS: New asymmetric airspace disease is seen throughout the right lung, and perhaps to a lesser degree in the retrocardiac left lower lobe. No definite pleural effusion seen on this portable exam. Cardiomegaly is stable.  IMPRESSION: New asymmetric airspace disease, which may be due to pneumonia or asymmetric pulmonary edema.  Stable cardiomegaly.   Electronically Signed   By: Earle Gell M.D.   On: 04/02/2015 14:05   Dg Chest Port 1 View  03/31/2015   CLINICAL DATA:  Fatigue  EXAM: PORTABLE CHEST - 1 VIEW  COMPARISON:  11/16/2014  FINDINGS: Cardiac shadow is enlarged. Aortic calcifications are again noted. No focal infiltrate or sizable effusion is seen. No bony abnormality is noted.  IMPRESSION: No active disease.   Electronically Signed    By: Inez Catalina M.D.   On: 03/31/2015 15:56   Dg Swallowing Func-speech Pathology  04/03/2015     Objective Swallowing Evaluation:    Patient Details  Name: Bradley [name] MRN: [id number] Date of Birth: 10-19-1941  Today's Date: 04/03/2015 Time: SLP Start Time (ACUTE ONLY): 1335-SLP Stop Time (ACUTE ONLY): 1405 SLP Time Calculation (min) (ACUTE ONLY): 30 min  Past Medical History:  Past Medical History  Diagnosis Date  . Stroke   . Hypertension   . Hyperlipidemia   . Aortic stenosis   . CHF (congestive heart failure)   . MGUS (monoclonal gammopathy of unknown significance)   . Goiter   . Rhabdomyolysis   . UTI (urinary tract infection) 05/09/2014   Past Surgical History:  Past Surgical History  Procedure Laterality Date  . Circumcision     HPI:  HPI: 74 year old male with history of stroke with residual right-sided  deficits who presents with difficulty ambulating due to right leg weakness  and confusion. Chest x-ray with new asymmetric airspace  disease, which may  be due to pneumonia.  Daughter denies difficulty with liquids previously,  but that chewing foods was difficult.  Daughter also reports history of  thyroid issues; SLP able to observe enlarged area on right side of neck.     No Data Recorded  Assessment / Plan / Recommendation CHL IP CLINICAL IMPRESSIONS 04/03/2015  Dysphagia Diagnosis Mild oral phase dysphagia;Moderate pharyngeal phase  dysphagia  Clinical impression Pt presents with mild oral and moderate pharyngeal  phase dysphagia characterized by delayed oral transit, delayed swallow  initiation to the pyriforms. Pt aspirated thin liquids before the swallow  with sensation. Flash penetration noted with nectar-thick liquids.  Noted  lingual residue post-swallow, which then pools in valleculae, suspect due  to reduced bolus manipulation. Residue removed following cued dry swallow.  Recommend pt initiate dys 3/ nectar thick liquid diet; small sips; two  swallows following sips of liquids. Pt still at risk  for aspiration given  delayed swallow initiation and base of tongue weakness. Speech will  follow-up with diet tolerance.       CHL IP TREATMENT RECOMMENDATION 04/03/2015  Treatment Plan Recommendations Therapy as outlined in treatment plan below      CHL IP DIET RECOMMENDATION 04/03/2015  Diet Recommendations Dysphagia 3 (Mechanical Soft);Nectar-thick liquid  Liquid Administration via Straw;Cup  Medication Administration Whole meds with liquid  Compensations Slow rate;Small sips/bites;Multiple dry swallows after each  bite/sip  Postural Changes and/or Swallow Maneuvers Seated upright 90 degrees     CHL IP OTHER RECOMMENDATIONS 04/03/2015  Recommended Consults (None)  Oral Care Recommendations Oral care BID  Other Recommendations Have oral suction available     CHL IP FOLLOW UP RECOMMENDATIONS 04/03/2015  Follow up Recommendations Other (comment)     CHL IP FREQUENCY AND DURATION 04/03/2015  Speech Therapy Frequency (ACUTE ONLY) min 2x/week  Treatment Duration 2 weeks     Pertinent Vitals/Pain NA    SLP Swallow Goals No flowsheet data found.  No flowsheet data found.    CHL IP REASON FOR REFERRAL 04/03/2015  Reason for Referral Objectively evaluate swallowing function     CHL IP ORAL PHASE 04/03/2015  Lips (None)  Tongue (None)  Mucous membranes (None)  Nutritional status (None)  Other (None)  Oxygen therapy (None)  Oral Phase Impaired  Oral - Pudding Teaspoon (None)  Oral - Pudding Cup (None)  Oral - Honey Teaspoon (None)  Oral - Honey Cup (None)  Oral - Honey Syringe (None)  Oral - Nectar Teaspoon (None)  Oral - Nectar Cup (None)  Oral - Nectar Straw Lingual/palatal residue  Oral - Nectar Syringe (None)  Oral - Ice Chips (None)  Oral - Thin Teaspoon (None)  Oral - Thin Cup (None)  Oral - Thin Straw Lingual/palatal residue  Oral - Thin Syringe (None)  Oral - Puree Delayed oral transit;Lingual/palatal residue  Oral - Mechanical Soft (None)  Oral - Regular Delayed oral transit;Other (Comment);Lingual/palatal  residue  Oral -  Multi-consistency (None)  Oral - Pill (None)  Oral Phase - Comment (None)      CHL IP PHARYNGEAL PHASE 04/03/2015  Pharyngeal Phase Impaired  Pharyngeal - Pudding Teaspoon (None)  Penetration/Aspiration details (pudding teaspoon) (None)  Pharyngeal - Pudding Cup (None)  Penetration/Aspiration details (pudding cup) (None)  Pharyngeal - Honey Teaspoon (None)  Penetration/Aspiration details (honey teaspoon) (None)  Pharyngeal - Honey Cup (None)  Penetration/Aspiration details (honey cup) (None)  Pharyngeal - Honey Syringe (None)  Penetration/Aspiration details (honey syringe) (None)  Pharyngeal - Nectar Teaspoon (None)  Penetration/Aspiration details (nectar teaspoon) (None)  Pharyngeal - Nectar Cup Delayed swallow initiation;Premature spillage to  pyriform sinuses;Pharyngeal residue - valleculae;Penetration/Aspiration  during swallow  Penetration/Aspiration details (nectar cup) Material enters airway,  remains ABOVE vocal cords then ejected out  Pharyngeal - Nectar Straw (None)  Penetration/Aspiration details (nectar straw) (None)  Pharyngeal - Nectar Syringe (None)  Penetration/Aspiration details (nectar syringe) (None)  Pharyngeal - Ice Chips (None)  Penetration/Aspiration details (ice chips) (None)  Pharyngeal - Thin Teaspoon (None)  Penetration/Aspiration details (thin teaspoon) (None)  Pharyngeal - Thin Cup (None)  Penetration/Aspiration details (thin cup) (None)  Pharyngeal - Thin Straw Delayed swallow initiation;Premature spillage to  pyriform sinuses;Penetration/Aspiration before swallow  Penetration/Aspiration details (thin straw) Material enters airway, passes  BELOW cords and not ejected out despite cough attempt by patient  Pharyngeal - Thin Syringe (None)  Penetration/Aspiration details (thin syringe') (None)  Pharyngeal - Puree Delayed swallow initiation;Premature spillage to  valleculae;Pharyngeal residue - valleculae  Penetration/Aspiration details (puree) (None)  Pharyngeal - Mechanical Soft (None)   Penetration/Aspiration details (mechanical soft) (None)  Pharyngeal - Regular Delayed swallow initiation;Premature spillage to  valleculae;Pharyngeal residue - valleculae  Penetration/Aspiration details (regular) (None)  Pharyngeal - Multi-consistency (None)  Penetration/Aspiration details (multi-consistency) (None)  Pharyngeal - Pill Delayed swallow initiation  Penetration/Aspiration details (pill) (None)  Pharyngeal Comment (None)     CHL IP CERVICAL ESOPHAGEAL PHASE 04/03/2015  Cervical Esophageal Phase WFL  Pudding Teaspoon (None)  Pudding Cup (None)  Honey Teaspoon (None)  Honey Cup (None)  Honey Syringe (None)  Nectar Teaspoon (None)  Nectar Cup (None)  Nectar Straw (None)  Nectar Syringe (None)  Thin Teaspoon (None)  Thin Cup (None)  Thin Straw (None)  Thin Syringe (None)  Cervical Esophageal Comment (None)    No flowsheet data found.        Herbie Baltimore, MA CCC-SLP [phone]  Lynann Beaver 04/03/2015, 3:08 PM      PERTINENT LAB RESULTS: CBC:  Recent Labs  04/04/15 0608 04/05/15 0611  WBC 6.3 6.0  HGB 8.8* 9.8*  HCT 27.4* 30.9*  PLT 181 209   CMET CMP     Component Value Date/Time   NA 146* 04/05/2015 0611   K 5.0 04/05/2015 0611   CL 116* 04/05/2015 0611   CO2 19 04/05/2015 0611   GLUCOSE 112* 04/05/2015 0611   BUN 59* 04/05/2015 0611   CREATININE 1.76* 04/05/2015 0611   CALCIUM 9.9 04/05/2015 0611   PROT 7.4 04/01/2015 0705   ALBUMIN 3.1* 04/01/2015 0705   AST 22 04/01/2015 0705   ALT 23 04/01/2015 0705   ALKPHOS 79 04/01/2015 0705   BILITOT 0.7 04/01/2015 0705   GFRNONAA 37* 04/05/2015 0611   GFRAA 42* 04/05/2015 0611    GFR Estimated Creatinine Clearance: 32.5 mL/min (by C-G formula based on Cr of 1.76). No results for input(s): LIPASE, AMYLASE in the last 72 hours. No results for input(s): CKTOTAL, CKMB, CKMBINDEX, TROPONINI in the last 72 hours. Invalid input(s): POCBNP No results for input(s): DDIMER in the last 72 hours. No results for input(s):  HGBA1C in the last 72 hours. No results for input(s): CHOL, HDL, LDLCALC, TRIG, CHOLHDL, LDLDIRECT in the last 72 hours. No results for input(s): TSH, T4TOTAL, T3FREE, THYROIDAB in the last 72 hours.  Invalid input(s): FREET3 No results for input(s): VITAMINB12, FOLATE, FERRITIN, TIBC, IRON, RETICCTPCT in the last 72 hours. Coags: No results for input(s): INR in the last 72 hours.  Invalid input(s): PT Microbiology: Recent Results (from the past 240 hour(s))  Urine  culture     Status: None   Collection Time: 03/31/15  3:21 PM  Result Value Ref Range Status   Specimen Description URINE, RANDOM  Final   Special Requests NONE  Final   Colony Count   Final    >=100,000 COLONIES/ML Performed at [facility]   Final    KLEBSIELLA PNEUMONIAE Performed at [facility]    Report Status 04/03/2015 FINAL  Final   Organism ID, Bacteria KLEBSIELLA PNEUMONIAE  Final      Susceptibility   Klebsiella pneumoniae - MIC*    AMPICILLIN RESISTANT      CEFAZOLIN <=4 SENSITIVE Sensitive     CEFTRIAXONE <=1 SENSITIVE Sensitive     CIPROFLOXACIN <=0.25 SENSITIVE Sensitive     GENTAMICIN <=1 SENSITIVE Sensitive     LEVOFLOXACIN 1 SENSITIVE Sensitive     NITROFURANTOIN 32 SENSITIVE Sensitive     TOBRAMYCIN <=1 SENSITIVE Sensitive     TRIMETH/SULFA <=20 SENSITIVE Sensitive     PIP/TAZO <=4 SENSITIVE Sensitive     * KLEBSIELLA PNEUMONIAE     BRIEF HOSPITAL COURSE:   Acute encephalopathy: Likely secondary to UTI. Suspect some amount of dementia at baseline. Treated with IV Abx mental status significantly improved and at baseline (per daughter).   UTI: Patient presented with confusion, UA was consistent with UTI. He however did not have any fever. Patient was empirically started on levofloxacin-however changed to Zosyn given aspiration pneumonia. Urine culture shows pansensitive Klebsiella pneumoniae.   Acute hypoxic respiratory failure: Suspect that this is more from  aspiration pneumonia with some element of acute diastolic heart failure.Treated with oxygen now weaned off, IV Lasix and intravenous Zosyn. Significantly improved at the time of discharge.  Probable aspiration pneumonia: Given history of CVA, and unilateral right-sided infiltrates-suspect aspiration pneumonia. Suspect was not present on admission, suspect developed aspiration when patient was encephalopathic/confused during the early part of his hospitalization.  Initially on Levaquin for UTI which was discontinued and started on Zosyn. Underwent SLP evaluation, patient started on dysphagia 3 diet with nectar thick liquids , daughter aware of risks of aspiration.Much improved by day of discharge, weaned off oxygen, will be transitioned to Augmentin for 3 more days from 4/7.   Acute diastolic heart failure: Given numerous doses of prn Lasix, however suspect that respiratory failure is multifactorial from both aspiration pneumonia and acute diastolic heart failure. Challenging situation given increases in his creatinine and severe aortic stenosis. Was seen by cardiology during this hospital stay. By day of discharge significantly improved.  Willl restart Lasix 40 mg by mouth twice a day on discharge. Will need close monitoring of renal function, daily weights while at SNF.    Acute on chronic kidney disease stage III: Creatinine slowly increasing, suspect multifactorial etiology-combination of prerenal azotemia from aspiration pneumonia/UTI/Lasix. Case discussed with patient's primary nephrologist Dr Mercy Moore, who suggested patient's baseline creatinine was more like 1.8 1.9 range. He suggested to continue with  supportive care.Thankfully, by day of discharge, creatinine back down to 1.76. Please continue to monitor H closely while at SNF    Minimally elevated troponin: Patient without any chest pain or shortness of breath, this is likely secondary to chronic kidney disease. Recent echocardiogram showed  preserved ejection fraction without any wall motion abnormality.  Continue aspirin. not a candidate for further investigations given CK D stage III   History of severe aortic stenosis: Seen by cardiology and subsequently evaluated by Dr. Burt Knack, after reviewing chart, discussing with patient and  family, felt not to be a candidate for either conventional surgical aortic graft placement or transcatheter aortic valve replacement. Family aware that patient will likely develop progressive heart failure and has very overall poor prognosis. Please obtain palliative care evaluation while at SNF   History of hypertension: Held Avapro given worsening renal function, continue clonidine, labetalol and hydralazine  History of CVA: Continue aspirin. Speech at baseline is dysarthric. Nonfocal exam, but has some generalized weakness.   Mild hypothyroidism: Patient has a upcoming appointment with Endocrinology (Dr Dwyane Dee), deferring further treatment to endocrinology for now.   Suspected mild dementia: Current mental status close to usual baseline, suspect some mild sundowning intermittently as well. Per patient's daughter, patient is close to his usual baseline   TODAY-DAY OF DISCHARGE:  Subjective:   Yordin Rhoda today has no headache,no chest abdominal pain,no new weakness tingling or numbness, feels much better wants to go home today.   Objective:   Blood pressure 150/70, pulse 71, temperature 97.7 F (36.5 C), temperature source Oral, resp. rate 19, height 5\' 5"  (1.651 m), weight 65 kg (143 lb 4.8 oz), SpO2 98 %.  Intake/Output Summary (Last 24 hours) at 04/05/15 1020 Last data filed at 04/05/15 0412  Gross per 24 hour  Intake      0 ml  Output    495 ml  Net   -495 ml   Filed Weights   03/31/15 1907  Weight: 65 kg (143 lb 4.8 oz)    Exam Awake Alert, Oriented *3, No new F.N deficits, Normal affect [place].AT,PERRAL Supple Neck,No JVD, No cervical lymphadenopathy appriciated.  Symmetrical  Chest wall movement, Good air movement bilaterally, CTAB RRR,No Gallops,Rubs or new Murmurs, No Parasternal Heave +ve B.Sounds, Abd Soft, Non tender, No organomegaly appriciated, No rebound -guarding or rigidity. No Cyanosis, Clubbing or edema, No new Rash or bruise  DISCHARGE CONDITION: Stable  DISPOSITION: SNF   DISCHARGE INSTRUCTIONS:    Activity:  As tolerated with Full fall precautions use walker/cane & assistance as needed  Diet recommendation: Dysphagia 3 diet with nectar thick liquids-but heart healthy Fluid restriction 1.2 lit/day Aspiration precautions:yes  Discharge Instructions    Beta Blocker already ordered    Complete by:  As directed      Call MD for:  difficulty breathing, headache or visual disturbances    Complete by:  As directed      Call MD for:  extreme fatigue    Complete by:  As directed      Call MD for:  persistant dizziness or light-headedness    Complete by:  As directed      Call MD for:  temperature >100.4    Complete by:  As directed      Contraindication to ACEI at discharge    Complete by:  As directed      Diet - low sodium heart healthy    Complete by:  As directed   Dysphagia 3 diet with nectar thick liquids     Heart Failure patients record your daily weight using the same scale at the same time of day    Complete by:  As directed      Increase activity slowly    Complete by:  As directed      Increase activity slowly    Complete by:  As directed      STOP any activity that causes chest pain, shortness of breath, dizziness, sweating, or exessive weakness    Complete by:  As directed  Follow-up Information    Follow up with [name], MD. Schedule an appointment as soon as possible for a visit on 04/05/2015.   Specialty:  Family Medicine   Why:  please ask your primary M.D. to follow urine culture results, and also to check a chemistry. panel to monitor renal function. APPT schedueld for Thursday, April 07th,  2016 for 02:30pm.    Contact information:   [place] [address] [phone]       Follow up with Dorris Carnes, MD. Schedule an appointment as soon as possible for a visit in 1 week.   Specialty:  Cardiology   Contact information:   [place] [address] [phone]       Follow up with [facility], MD On 04/13/2015.   Specialty:  Endocrinology   Why:  Keep existing appointment   Contact information:   [place] [address] [phone]       Follow up with MATTINGLY,MICHAEL T, MD. Schedule an appointment as soon as possible for a visit in 2 weeks.   Specialty:  Nephrology   Contact information:   [place] [address] [phone]       Follow up with [facility].   Why:  They will contact you to schedule home nurse and therapy visits.    Contact information:   [place] [address] [phone]         Total Time spent on discharge equals 45 minutes.  SignedOren Binet 04/05/2015 10:20 AM

## 2015-04-05 NOTE — Clinical Social Work Note (Signed)
Clinical Social Worker facilitated patient discharge including contacting patient family and facility to confirm patient discharge plans.  Clinical information faxed to facility and family agreeable with plan.  CSW arranged family transport to [facility].  RN to call report prior to discharge.  Clinical Social Worker will sign off for now as social work intervention is no longer needed. Please consult [place] again if new need arises.  Barbette Or, [place]

## 2015-04-06 ENCOUNTER — Non-Acute Institutional Stay (SKILLED_NURSING_FACILITY): Payer: Medicare Other | Admitting: Adult Health

## 2015-04-06 ENCOUNTER — Encounter: Payer: Self-pay | Admitting: Adult Health

## 2015-04-06 DIAGNOSIS — E785 Hyperlipidemia, unspecified: Secondary | ICD-10-CM | POA: Diagnosis not present

## 2015-04-06 DIAGNOSIS — N400 Enlarged prostate without lower urinary tract symptoms: Secondary | ICD-10-CM | POA: Diagnosis not present

## 2015-04-06 DIAGNOSIS — J690 Pneumonitis due to inhalation of food and vomit: Secondary | ICD-10-CM | POA: Diagnosis not present

## 2015-04-06 DIAGNOSIS — E876 Hypokalemia: Secondary | ICD-10-CM | POA: Diagnosis not present

## 2015-04-06 DIAGNOSIS — R5381 Other malaise: Secondary | ICD-10-CM

## 2015-04-06 DIAGNOSIS — I10 Essential (primary) hypertension: Secondary | ICD-10-CM | POA: Diagnosis not present

## 2015-04-06 DIAGNOSIS — N390 Urinary tract infection, site not specified: Secondary | ICD-10-CM | POA: Diagnosis not present

## 2015-04-06 DIAGNOSIS — I5033 Acute on chronic diastolic (congestive) heart failure: Secondary | ICD-10-CM | POA: Diagnosis not present

## 2015-04-06 NOTE — Progress Notes (Addendum)
Patient ID: Bradley [name], male   DOB: Sep 19, 1941, 74 y.o.   MRN: [id number]   04/06/2015  Facility:  [facility]urs[facility] Home Location:  [place] Room Number: 1101-1 LEVEL OF CARE:  SNF (31)   Chief Complaint  Patient presents with  . Hospitalization Follow-up    Physical deconditioning, aspiration pneumonia, hypertension, UTI, diastolic heart failure, hyperlipidemia, hypokalemia and BPH    HISTORY OF PRESENT ILLNESS:  This is a 74 year old male who has been admitted to [facility] on 04/05/15 from [facility] He has PMH of hypertension, anemia, hyperlipidemia, and CHF. He presented to the hospital with difficulty ambulating due to right leg weakness and confusion. UA was consistent with UTI and was started on levofloxacin. However chest x-ray shows right-sided infiltrates and aspiration pneumonia was suspected. Antibiotic was switched to Zosyn. He was also diagnosed with acute diastolic heart failure. He was given several PRN Lasix.  He has been admitted for a short-term rehabilitation.  PAST MEDICAL HISTORY:  Past Medical History  Diagnosis Date  . Stroke   . Hypertension   . Hyperlipidemia   . Aortic stenosis   . CHF (congestive heart failure)   . MGUS (monoclonal gammopathy of unknown significance)   . Goiter   . Rhabdomyolysis   . UTI (urinary tract infection) 05/09/2014    CURRENT MEDICATIONS: Reviewed per MAR/see medication list  No Known Allergies   REVIEW OF SYSTEMS:  GENERAL: no change in appetite, no fatigue, no weight changes, no fever, chills or weakness RESPIRATORY: no cough, SOB, DOE, wheezing, hemoptysis CARDIAC: no chest pain, edema or palpitations GI: no abdominal pain, diarrhea, constipation, heart burn, nausea or vomiting  PHYSICAL EXAMINATION  GENERAL: no acute distress, normal body habitus EYES: conjunctivae normal, sclerae normal, normal eye lids NECK: supple, trachea midline, no neck masses, no thyroid tenderness, no  thyromegaly, left neck soft mass, nontender  and multiple small masses on trunk and BUE (had it for several years) LYMPHATICS: no LAN in the neck, no supraclavicular LAN RESPIRATORY: breathing is even & unlabored, BS CTAB CARDIAC: RRR, no murmur,no extra heart sounds, no edema GI: abdomen soft, normal BS, no masses, no tenderness, no hepatomegaly, no splenomegaly EXTREMITIES:  Able to move X 4 extremities PSYCHIATRIC: the patient is alert & oriented to person, affect & behavior appropriate  LABS/RADIOLOGY: Labs reviewed: Basic Metabolic Panel:  Recent Labs  04/03/15 0646 04/04/15 0608 04/05/15 0611  NA 143 141 146*  K 4.5 4.7 5.0  CL 111 110 116*  CO2 23 23 19   GLUCOSE 123* 112* 112*  BUN 55* 64* 59*  CREATININE 1.93* 1.96* 1.76*  CALCIUM 9.7 9.5 9.9   Liver Function Tests:  Recent Labs  04/01/15 0705  AST 22  ALT 23  ALKPHOS 79  BILITOT 0.7  PROT 7.4  ALBUMIN 3.1*   CBC:  Recent Labs  05/08/14 1201  03/31/15 1512  04/03/15 0646 04/04/15 0608 04/05/15 0611  WBC 19.3*  < > 8.9  < > 6.6 6.3 6.0  NEUTROABS 18.0*  --  7.2  --   --   --   --   HGB 10.7*  < > 10.1*  < > 9.6* 8.8* 9.8*  HCT 33.1*  < > 31.4*  < > 29.6* 27.4* 30.9*  MCV 87.6  < > 84.6  < > 83.6 83.0 83.7  PLT 127*  < > 152  < > 163 181 209  < > = values in this interval not displayed.  Cardiac Enzymes:  Recent Labs  04/01/15 1557 04/01/15 2120 04/02/15 0306  TROPONINI 0.19* 0.39* 0.51*   CBG:  Recent Labs  05/08/14 1156  GLUCAP 97    Dg Chest Port 1 View  04/04/2015   CLINICAL DATA:  Pneumonia  EXAM: PORTABLE CHEST - 1 VIEW  COMPARISON:  04/02/2015; 03/31/2015; 11/16/2014  FINDINGS: Grossly unchanged enlarged cardiac silhouette and mediastinal contours with atherosclerotic plaque within the thoracic aorta. Ill-defined heterogeneous opacities within the right mid lung have minimally worsened in the interval. Pulmonary vasculature is less distinct than present examination. No definite  pleural effusion or pneumothorax. Unchanged bones. A minimal amount of enteric contrast is seen within the stomach and splenic flexure of the colon.  IMPRESSION: Findings suggestive of worsening pulmonary edema with worsening superimposed heterogeneous airspace opacities within the right mid lung with differential considerations including asymmetric alveolar pulmonary edema versus underlying infection. Continued attention on follow-up is recommended.   Electronically Signed   By: Sandi Mariscal M.D.   On: 04/04/2015 07:37   Dg Chest Port 1 View  04/02/2015   CLINICAL DATA:  Congestive heart failure.  Urinary tract infection.  EXAM: PORTABLE CHEST - 1 VIEW  COMPARISON:  03/31/2015  FINDINGS: New asymmetric airspace disease is seen throughout the right lung, and perhaps to a lesser degree in the retrocardiac left lower lobe. No definite pleural effusion seen on this portable exam. Cardiomegaly is stable.  IMPRESSION: New asymmetric airspace disease, which may be due to pneumonia or asymmetric pulmonary edema.  Stable cardiomegaly.   Electronically Signed   By: Earle Gell M.D.   On: 04/02/2015 14:05   Dg Chest Port 1 View  03/31/2015   CLINICAL DATA:  Fatigue  EXAM: PORTABLE CHEST - 1 VIEW  COMPARISON:  11/16/2014  FINDINGS: Cardiac shadow is enlarged. Aortic calcifications are again noted. No focal infiltrate or sizable effusion is seen. No bony abnormality is noted.  IMPRESSION: No active disease.   Electronically Signed   By: Inez Catalina M.D.   On: 03/31/2015 15:56   Dg Swallowing Func-speech Pathology  04/03/2015     Objective Swallowing Evaluation:    Patient Details  Name: Bradley [name] MRN: [id number] Date of Birth: [birth year]  Today's Date: 04/03/2015 Time: SLP Start Time (ACUTE ONLY): 1335-SLP Stop Time (ACUTE ONLY): 1405 SLP Time Calculation (min) (ACUTE ONLY): 30 min  Past Medical History:  Past Medical History  Diagnosis Date  . Stroke   . Hypertension   . Hyperlipidemia   . Aortic stenosis   . CHF  (congestive heart failure)   . MGUS (monoclonal gammopathy of unknown significance)   . Goiter   . Rhabdomyolysis   . UTI (urinary tract infection) 05/09/2014   Past Surgical History:  Past Surgical History  Procedure Laterality Date  . Circumcision     HPI:  HPI: 74 year old male with history of stroke with residual right-sided  deficits who presents with difficulty ambulating due to right leg weakness  and confusion. Chest x-ray with new asymmetric airspace disease, which may  be due to pneumonia.  Daughter denies difficulty with liquids previously,  but that chewing foods was difficult.  Daughter also reports history of  thyroid issues; SLP able to observe enlarged area on right side of neck.     No Data Recorded  Assessment / Plan / Recommendation CHL IP CLINICAL IMPRESSIONS 04/03/2015  Dysphagia Diagnosis Mild oral phase dysphagia;Moderate pharyngeal phase  dysphagia  Clinical impression Pt presents with mild oral and moderate pharyngeal  phase dysphagia characterized by delayed oral transit, delayed swallow  initiation to the pyriforms. Pt aspirated thin liquids before the swallow  with sensation. Flash penetration noted with nectar-thick liquids.  Noted  lingual residue post-swallow, which then pools in valleculae, suspect due  to reduced bolus manipulation. Residue removed following cued dry swallow.  Recommend pt initiate dys 3/ nectar thick liquid diet; small sips; two  swallows following sips of liquids. Pt still at risk for aspiration given  delayed swallow initiation and base of tongue weakness. Speech will  follow-up with diet tolerance.       CHL IP TREATMENT RECOMMENDATION 04/03/2015  Treatment Plan Recommendations Therapy as outlined in treatment plan below      CHL IP DIET RECOMMENDATION 04/03/2015  Diet Recommendations Dysphagia 3 (Mechanical Soft);Nectar-thick liquid  Liquid Administration via Straw;Cup  Medication Administration Whole meds with liquid  Compensations Slow rate;Small sips/bites;Multiple  dry swallows after each  bite/sip  Postural Changes and/or Swallow Maneuvers Seated upright 90 degrees     CHL IP OTHER RECOMMENDATIONS 04/03/2015  Recommended Consults (None)  Oral Care Recommendations Oral care BID  Other Recommendations Have oral suction available     CHL IP FOLLOW UP RECOMMENDATIONS 04/03/2015  Follow up Recommendations Other (comment)     CHL IP FREQUENCY AND DURATION 04/03/2015  Speech Therapy Frequency (ACUTE ONLY) min 2x/week  Treatment Duration 2 weeks     Pertinent Vitals/Pain NA    SLP Swallow Goals No flowsheet data found.  No flowsheet data found.    CHL IP REASON FOR REFERRAL 04/03/2015  Reason for Referral Objectively evaluate swallowing function     CHL IP ORAL PHASE 04/03/2015  Lips (None)  Tongue (None)  Mucous membranes (None)  Nutritional status (None)  Other (None)  Oxygen therapy (None)  Oral Phase Impaired  Oral - Pudding Teaspoon (None)  Oral - Pudding Cup (None)  Oral - Honey Teaspoon (None)  Oral - Honey Cup (None)  Oral - Honey Syringe (None)  Oral - Nectar Teaspoon (None)  Oral - Nectar Cup (None)  Oral - Nectar Straw Lingual/palatal residue  Oral - Nectar Syringe (None)  Oral - Ice Chips (None)  Oral - Thin Teaspoon (None)  Oral - Thin Cup (None)  Oral - Thin Straw Lingual/palatal residue  Oral - Thin Syringe (None)  Oral - Puree Delayed oral transit;Lingual/palatal residue  Oral - Mechanical Soft (None)  Oral - Regular Delayed oral transit;Other (Comment);Lingual/palatal  residue  Oral - Multi-consistency (None)  Oral - Pill (None)  Oral Phase - Comment (None)      CHL IP PHARYNGEAL PHASE 04/03/2015  Pharyngeal Phase Impaired  Pharyngeal - Pudding Teaspoon (None)  Penetration/Aspiration details (pudding teaspoon) (None)  Pharyngeal - Pudding Cup (None)  Penetration/Aspiration details (pudding cup) (None)  Pharyngeal - Honey Teaspoon (None)  Penetration/Aspiration details (honey teaspoon) (None)  Pharyngeal - Honey Cup (None)  Penetration/Aspiration details (honey cup) (None)   Pharyngeal - Honey Syringe (None)  Penetration/Aspiration details (honey syringe) (None)  Pharyngeal - Nectar Teaspoon (None)  Penetration/Aspiration details (nectar teaspoon) (None)  Pharyngeal - Nectar Cup Delayed swallow initiation;Premature spillage to  pyriform sinuses;Pharyngeal residue - valleculae;Penetration/Aspiration  during swallow  Penetration/Aspiration details (nectar cup) Material enters airway,  remains ABOVE vocal cords then ejected out  Pharyngeal - Nectar Straw (None)  Penetration/Aspiration details (nectar straw) (None)  Pharyngeal - Nectar Syringe (None)  Penetration/Aspiration details (nectar syringe) (None)  Pharyngeal - Ice Chips (None)  Penetration/Aspiration details (ice chips) (None)  Pharyngeal - Thin Teaspoon (None)  Penetration/Aspiration details (thin teaspoon) (None)  Pharyngeal - Thin Cup (None)  Penetration/Aspiration details (thin cup) (None)  Pharyngeal - Thin Straw Delayed swallow initiation;Premature spillage to  pyriform sinuses;Penetration/Aspiration before swallow  Penetration/Aspiration details (thin straw) Material enters airway, passes  BELOW cords and not ejected out despite cough attempt by patient  Pharyngeal - Thin Syringe (None)  Penetration/Aspiration details (thin syringe') (None)  Pharyngeal - Puree Delayed swallow initiation;Premature spillage to  valleculae;Pharyngeal residue - valleculae  Penetration/Aspiration details (puree) (None)  Pharyngeal - Mechanical Soft (None)  Penetration/Aspiration details (mechanical soft) (None)  Pharyngeal - Regular Delayed swallow initiation;Premature spillage to  valleculae;Pharyngeal residue - valleculae  Penetration/Aspiration details (regular) (None)  Pharyngeal - Multi-consistency (None)  Penetration/Aspiration details (multi-consistency) (None)  Pharyngeal - Pill Delayed swallow initiation  Penetration/Aspiration details (pill) (None)  Pharyngeal Comment (None)     CHL IP CERVICAL ESOPHAGEAL PHASE 04/03/2015  Cervical  Esophageal Phase WFL  Pudding Teaspoon (None)  Pudding Cup (None)  Honey Teaspoon (None)  Honey Cup (None)  Honey Syringe (None)  Nectar Teaspoon (None)  Nectar Cup (None)  Nectar Straw (None)  Nectar Syringe (None)  Thin Teaspoon (None)  Thin Cup (None)  Thin Straw (None)  Thin Syringe (None)  Cervical Esophageal Comment (None)    No flowsheet data found.        Herbie Baltimore, MA CCC-SLP [phone]  Lynann Beaver 04/03/2015, 3:08 PM     ASSESSMENT/PLAN:  Physical deconditioning - for rehabilitation Aspiration pneumonia - continue Mucinex 600 mg by mouth twice a day; treated with Zosyn in hospital; continue Augmentin and 50/125 mg by mouth every 12 hours 3 days Acute systolic heart failure - continue Lasix 20 mg 2 tabs = 40 meq by mouth twice a day; daily weights BPH - continue Flomax 0.4 mg by mouth 3 times a day Hypokalemia - K5.0; decrease KCl to 20 meq PO Q other day Hypertension - continue Normodyne 300 mg by mouth twice a day, clonidine 0.3 mg/24 hour 1 patch every week, Apresoline 100 mg by mouth every morning and 50 mg by mouth daily at bedtime and Norvasc 10 mg by mouth daily Hyperlipidemia - continue atorvastatin 10 mg by mouth daily UTI - continue Augmentin and 50/125 mg by mouth every 12 hours 3 days CKD, stage III - creatinine 1.76; will monitor    Goals of care:  Short-term rehabilitation   Labs/test ordered:  Cmp, CBC  Spent 50 minutes in patient care.   [facility], NP [employer] [phone]

## 2015-04-10 ENCOUNTER — Non-Acute Institutional Stay (SKILLED_NURSING_FACILITY): Payer: Medicare Other | Admitting: Internal Medicine

## 2015-04-10 DIAGNOSIS — R5381 Other malaise: Secondary | ICD-10-CM | POA: Diagnosis not present

## 2015-04-10 DIAGNOSIS — N390 Urinary tract infection, site not specified: Secondary | ICD-10-CM

## 2015-04-10 DIAGNOSIS — R1310 Dysphagia, unspecified: Secondary | ICD-10-CM | POA: Diagnosis not present

## 2015-04-10 DIAGNOSIS — I350 Nonrheumatic aortic (valve) stenosis: Secondary | ICD-10-CM | POA: Diagnosis not present

## 2015-04-10 DIAGNOSIS — E785 Hyperlipidemia, unspecified: Secondary | ICD-10-CM | POA: Diagnosis not present

## 2015-04-10 DIAGNOSIS — N189 Chronic kidney disease, unspecified: Secondary | ICD-10-CM

## 2015-04-10 DIAGNOSIS — I5033 Acute on chronic diastolic (congestive) heart failure: Secondary | ICD-10-CM

## 2015-04-10 DIAGNOSIS — J690 Pneumonitis due to inhalation of food and vomit: Secondary | ICD-10-CM

## 2015-04-10 DIAGNOSIS — N183 Chronic kidney disease, stage 3 unspecified: Secondary | ICD-10-CM

## 2015-04-10 NOTE — Progress Notes (Signed)
Patient ID: Bradley [name], male   DOB: [birth year], 74 y.o.   MRN: [id number]     [facility]  PCP: [name], MD  Code Status: full code  No Known Allergies  Chief Complaint  Patient presents with  . New Admit To SNF     HPI:  74 year old patient is here for short term rehabilitation post hospital admission from 03/31/15-04/05/15 with acute encephalopathy in setting of klebsiella UTI and aspiration pneumonia. He had acute hypoxic respiratory failure in setting of pneumonia and chf.  He responded well to iv diuresis, antibiotics and o2. He also had demand ischemia and acute on chronic renal failure. He has PMH of HTN, CVA, CHF, hypothyroidism, severe AS and CKD He is seen today with his brother present. He is alert and oriented to person and place. He denies any concerns  Review of Systems:  Constitutional: Negative for fever, chills, diaphoresis.  HENT: Negative for headache, congestion Eyes: Negative for eye pain, blurred vision, double vision and discharge.  Respiratory: Negative for cough, shortness of breath and wheezing.   Cardiovascular: Negative for chest pain, palpitations. Positive for leg swelling.  Gastrointestinal: Negative for heartburn, nausea, vomiting, abdominal pain. Had bowel movement today Genitourinary: Negative for dysuria Musculoskeletal: Negative for back pain, falls Skin: Negative for itching, rash.  Neurological: Negative fordizziness, tingling, focal weakness Psychiatric/Behavioral: Negative for depression   Past Medical History  Diagnosis Date  . Stroke   . Hypertension   . Hyperlipidemia   . Aortic stenosis   . CHF (congestive heart failure)   . MGUS (monoclonal gammopathy of unknown significance)   . Goiter   . Rhabdomyolysis   . UTI (urinary tract infection) 05/09/2014   Past Surgical History  Procedure Laterality Date  . Circumcision     Social History:   reports that he has never smoked. He has  never used smokeless tobacco. He reports that he does not drink alcohol or use illicit drugs.  No family history on file.  Medications: Patient's Medications  New Prescriptions   No medications on file  Previous Medications   ALBUTEROL (PROVENTIL) (2.5 MG/3ML) 0.083% NEBULIZER SOLUTION    Take 3 mLs (2.5 mg total) by nebulization every 4 (four) hours as needed for wheezing or shortness of breath.   AMLODIPINE (NORVASC) 10 MG TABLET    Take 10 mg by mouth daily.   AMOXICILLIN-CLAVULANATE (AUGMENTIN) 500-125 MG PER TABLET    Take 1 tablet (500 mg total) by mouth every 12 (twelve) hours. 3 more days from 04/05/15 and then stop   ASPIRIN 81 MG TABLET    Take 81 mg by mouth daily.   CLONIDINE (CATAPRES - DOSED IN MG/24 HR) 0.3 MG/24HR    Place 1 patch onto the skin once a week. Sundays   FUROSEMIDE (LASIX) 20 MG TABLET    Take 2 tablets (40 mg total) by mouth 2 (two) times daily. Start on 4/5-but make sure that your Primary MD checks a Renal function panel in next 2-3 days.   GUAIFENESIN (MUCINEX) 600 MG 12 HR TABLET    Take 1 tablet (600 mg total) by mouth 2 (two) times daily.   HYDRALAZINE (APRESOLINE) 50 MG TABLET    Take 2 tablets (100 mg total) by mouth every morning.   HYDRALAZINE (APRESOLINE) 50 MG TABLET    Take 1 tablet (50 mg total) by mouth at bedtime.   IPRATROPIUM-ALBUTEROL (DUONEB) 0.5-2.5 (3) MG/3ML SOLN    Take 3 mLs by  nebulization 2 (two) times daily.   LABETALOL (NORMODYNE) 300 MG TABLET    Take 1 tablet (300 mg total) by mouth 2 (two) times daily.   POTASSIUM CHLORIDE SA (K-DUR,KLOR-CON) 20 MEQ TABLET    Take 1 tablet (20 mEq total) by mouth daily.   PRAVASTATIN (PRAVACHOL) 40 MG TABLET    Take 40 mg by mouth daily.   TAMSULOSIN HCL (FLOMAX) 0.4 MG CAPS    Take 0.4 mg by mouth 2 (two) times daily.   Modified Medications   No medications on file  Discontinued Medications   No medications on file     Physical Exam: Filed Vitals:   04/10/15 1402  BP: 128/76  Pulse: 79    Temp: 97 F (36.1 C)  Resp: 18  Weight: 138 lb 3.2 oz (62.687 kg)  SpO2: 99%    General- elderly male, in no acute distress Head- normocephalic, atraumatic Throat- moist mucus membrane Eyes- PERRLA, EOMI, no pallor, no icterus, no discharge, normal conjunctiva, normal sclera Neck- no cervical lymphadenopathy Cardiovascular- normal s1,s2, no murmurs, 2+ leg edema Respiratory- bilateral poor air entry, no wheeze, no rhonchi, no crackles, no use of accessory muscles Abdomen- bowel sounds present, soft, non tender Musculoskeletal- able to move all 4 extremities, generalized weakness, on wheelchair Neurological- no focal deficit, alert and oriented to person and place Skin- warm and dry Psychiatry- normal mood and affect    Labs reviewed: Basic Metabolic Panel:  Recent Labs  04/03/15 0646 04/04/15 0608 04/05/15 0611  NA 143 141 146*  K 4.5 4.7 5.0  CL 111 110 116*  CO2 23 23 19   GLUCOSE 123* 112* 112*  BUN 55* 64* 59*  CREATININE 1.93* 1.96* 1.76*  CALCIUM 9.7 9.5 9.9   Liver Function Tests:  Recent Labs  04/01/15 0705  AST 22  ALT 23  ALKPHOS 79  BILITOT 0.7  PROT 7.4  ALBUMIN 3.1*   No results for input(s): LIPASE, AMYLASE in the last 8760 hours. No results for input(s): AMMONIA in the last 8760 hours. CBC:  Recent Labs  05/08/14 1201  03/31/15 1512  04/03/15 0646 04/04/15 0608 04/05/15 0611  WBC 19.3*  < > 8.9  < > 6.6 6.3 6.0  NEUTROABS 18.0*  --  7.2  --   --   --   --   HGB 10.7*  < > 10.1*  < > 9.6* 8.8* 9.8*  HCT 33.1*  < > 31.4*  < > 29.6* 27.4* 30.9*  MCV 87.6  < > 84.6  < > 83.6 83.0 83.7  PLT 127*  < > 152  < > 163 181 209  < > = values in this interval not displayed. Cardiac Enzymes:  Recent Labs  04/01/15 1557 04/01/15 2120 04/02/15 0306  TROPONINI 0.19* 0.39* 0.51*   BNP: Invalid input(s): POCBNP CBG:  Recent Labs  05/08/14 1156  GLUCAP 97    Assessment/Plan  Physical deconditioning Will have him work with physical  therapy and occupational therapy team to help with gait training and muscle strengthening exercises.fall precautions. Skin care. Encourage to be out of bed.   Klebsiella UTI Completed course of augmentin, currently asymptomatic, encouraged hydration  Aspiration pneumonia  Completed course of augmentin, needs aspiration precautions, use duoneb prn and dysphagia diet  Dysphagia Continue pureed diet and nectar thick liquids, aspiration precuations  CHF Acute exacerbation in hospital. Continue lasix 40 mg bid, labetalol 300 mg bid and monitor weight. Continue kcl supplement, monitor bmp. Add ted hose  Severe AS Not  a surgical candidate, symptomatic treatment  ckd Monitor renal function  Hypertension continue Normodyne 300 mg bid, clonidine 0.3 mg/24 hour 1 patch every week, Apresoline 100 mg an, 50 mg pm and norvasc 10 mg daily  Hyperlipidemia  continue atorvastatin 10 mg daily   Goals of care: short term rehabilitation   Labs/tests ordered: cbc with diff, cmp in 1 week  Family/ staff Communication: reviewed care plan with patient and nursing supervisor    Blanchie Serve, MD  [facility] [phone] (Monday-Friday 8 am - 5 pm) [phone] (afterhours)

## 2015-04-13 ENCOUNTER — Encounter: Payer: Self-pay | Admitting: Endocrinology

## 2015-04-13 ENCOUNTER — Ambulatory Visit (INDEPENDENT_AMBULATORY_CARE_PROVIDER_SITE_OTHER): Payer: Medicare Other | Admitting: Endocrinology

## 2015-04-13 VITALS — BP 153/76 | HR 71 | Temp 98.2°F | Resp 16 | Ht 64.0 in | Wt 146.0 lb

## 2015-04-13 DIAGNOSIS — I10 Essential (primary) hypertension: Secondary | ICD-10-CM

## 2015-04-13 DIAGNOSIS — E0520 Thyrotoxicosis with toxic multinodular goiter without thyrotoxic crisis or storm: Secondary | ICD-10-CM

## 2015-04-13 NOTE — Progress Notes (Signed)
Patient ID: Bradley [name], male   DOB: 1941-12-15, 74 y.o.   MRN: [id number]    Reason for Appointment: Goiter, new consultation    History of Present Illness:   The patient's thyroid enlargement has been present for several years and the patient unable to give any clear history His daughter does not know when his goiter was diagnosed but has been several years Not clear if he has been seen by an endocrinologist in the past He has not had any thyroid ultrasounds or other radiological studies in the past and has not been on any treatments Patient apparently has had a swelling on the left side of his neck for some time and this has not been causing any discomfort, not clear this is enlarging in size  He has been referred here because of abnormal TSH levels by his PCP  He has had no difficulty with swallowing  Does not feel like he has any choking sensation in her neck or pressure in any position or when lying down. Currently the patient is in the nursing home after recent hospitalization for CHF and pneumonia  The patient does not think he has had any symptoms of palpitations, heat intolerance, shakiness, any recent weight loss, change in appetite or nervousness.  Although his TSH was low when checked by PCP in 1/15 it was normal from another lab in 3/16 However did not low again when checked during his hospitalization along with high free T4 which had not been checked before  Lab Results  Component Value Date   FREET4 3.54* 04/02/2015   FREET4 0.77* 02/28/2011   TSH 0.028* 04/01/2015   TSH 0.991 02/28/2011       Medication List       This list is accurate as of: 04/13/15 11:59 PM.  Always use your most recent med list.               albuterol (2.5 MG/3ML) 0.083% nebulizer solution  Commonly known as:  PROVENTIL  Take 3 mLs (2.5 mg total) by nebulization every 4 (four) hours as needed for wheezing or shortness of breath.     amLODipine 10 MG tablet  Commonly known as:   NORVASC  Take 10 mg by mouth daily.     amoxicillin-clavulanate 500-125 MG per tablet  Commonly known as:  AUGMENTIN  Take 1 tablet (500 mg total) by mouth every 12 (twelve) hours. 3 more days from 04/05/15 and then stop     aspirin 81 MG tablet  Take 81 mg by mouth daily.     cloNIDine 0.3 mg/24hr patch  Commonly known as:  CATAPRES - Dosed in mg/24 hr  Place 1 patch onto the skin once a week. Sundays     furosemide 40 MG tablet  Commonly known as:  LASIX     furosemide 20 MG tablet  Commonly known as:  LASIX  Take 2 tablets (40 mg total) by mouth 2 (two) times daily. Start on 4/5-but make sure that your Primary MD checks a Renal function panel in next 2-3 days.     guaiFENesin 600 MG 12 hr tablet  Commonly known as:  MUCINEX  Take 1 tablet (600 mg total) by mouth 2 (two) times daily.     hydrALAZINE 50 MG tablet  Commonly known as:  APRESOLINE  Take 2 tablets (100 mg total) by mouth every morning.     ipratropium-albuterol 0.5-2.5 (3) MG/3ML Soln  Commonly known as:  DUONEB  Take 3 mLs by nebulization  2 (two) times daily.     irbesartan 300 MG tablet  Commonly known as:  AVAPRO     labetalol 300 MG tablet  Commonly known as:  NORMODYNE  Take 1 tablet (300 mg total) by mouth 2 (two) times daily.     potassium chloride SA 20 MEQ tablet  Commonly known as:  K-DUR,KLOR-CON  Take 1 tablet (20 mEq total) by mouth daily.     pravastatin 40 MG tablet  Commonly known as:  PRAVACHOL  Take 40 mg by mouth daily.     tamsulosin 0.4 MG Caps capsule  Commonly known as:  FLOMAX  Take 0.4 mg by mouth 2 (two) times daily.        Allergies: No Known Allergies  Past Medical History  Diagnosis Date  . Stroke   . Hypertension   . Hyperlipidemia   . Aortic stenosis   . CHF (congestive heart failure)   . MGUS (monoclonal gammopathy of unknown significance)   . Goiter   . Rhabdomyolysis   . UTI (urinary tract infection) 05/09/2014    Past Surgical History  Procedure  Laterality Date  . Circumcision      No family history on file.  Social History:  reports that he has never smoked. He has never used smokeless tobacco. He reports that he does not drink alcohol or use illicit drugs.   Review of Systems:  There is a long-standing history of high blood pressure.             No  history of Diabetes.      Review of Systems  Constitutional: Negative for weight loss and malaise/fatigue.  Eyes: Negative for blurred vision.  Respiratory: Negative for shortness of breath.   Cardiovascular: Negative for chest pain and palpitations.  Gastrointestinal: Negative for vomiting and diarrhea.  Musculoskeletal: Negative for myalgias.  Neurological: Positive for focal weakness. Negative for headaches.       He has mild right-sided weakness from previous CVA  Endo/Heme/Allergies: Negative for polydipsia.  Psychiatric/Behavioral: The patient does not have insomnia.   Had multiple strokes in the past, last in 2013         Examination:   BP 153/76 mmHg  Pulse 71  Temp(Src) 98.2 F (36.8 C)  Resp 16  Ht 5\' 4"  (1.626 m)  Wt 146 lb (66.225 kg)  BMI 25.05 kg/m2  SpO2 96%   General Appearance: pleasant, averagely built and nourished, has mildly slurred speech          Eyes: No abnormal prominence or eyelid swelling. He has mild upper lid retraction bilaterally but no lid lag or stare. Oral mucosa appears normal           Neck: The thyroid is enlarged markedly on the left side laterally and extending posteriorly also.  It measures about 4 inches in height and 4-5 inches across.  It is relatively smooth without distinct nodules, firm. 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  Right lobe was not palpable but patient is not able to cooperate with swallowing There is no stridor.  There is no lymphadenopathy.    Cardiovascular: Normal  heart sounds, loud 4/6 harsh ejection murmur present all over the precordium and base Respiratory:  Lungs clear Neurological:  REFLEXES: at biceps are brisk.  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 Discussed with patient and family that he has a toxic nodular goiter which can potentially have cardiac effects as well as general metabolic effects  He will need to be treated with radioactive iodine and probably may need a large dose of this because of his large goiter  His I-131 uptake and scan will be scheduled and further management discussed once the results are in Since he does not have any tachycardia at this time his medication regimen will not be changed  He is not on a beta blocker but heart rate appears to be controlled with high doses of labetalol May consider using antithyroid drugs after the I-131 treatment is given  Follow-up to be done about 3 weeks after his reductive iodine treatment  [facility] 04/14/2015

## 2015-04-26 ENCOUNTER — Encounter (HOSPITAL_COMMUNITY): Admission: RE | Admit: 2015-04-26 | Payer: Medicare Other | Source: Ambulatory Visit

## 2015-04-27 ENCOUNTER — Non-Acute Institutional Stay (SKILLED_NURSING_FACILITY): Payer: Medicare Other | Admitting: Adult Health

## 2015-04-27 ENCOUNTER — Encounter: Payer: Self-pay | Admitting: Adult Health

## 2015-04-27 ENCOUNTER — Encounter (HOSPITAL_COMMUNITY): Payer: Medicare Other | Attending: Endocrinology

## 2015-04-27 DIAGNOSIS — I5032 Chronic diastolic (congestive) heart failure: Secondary | ICD-10-CM | POA: Diagnosis not present

## 2015-04-27 DIAGNOSIS — I10 Essential (primary) hypertension: Secondary | ICD-10-CM | POA: Diagnosis not present

## 2015-04-27 DIAGNOSIS — E876 Hypokalemia: Secondary | ICD-10-CM | POA: Diagnosis not present

## 2015-04-27 DIAGNOSIS — E0520 Thyrotoxicosis with toxic multinodular goiter without thyrotoxic crisis or storm: Secondary | ICD-10-CM | POA: Diagnosis not present

## 2015-04-27 DIAGNOSIS — E785 Hyperlipidemia, unspecified: Secondary | ICD-10-CM | POA: Diagnosis not present

## 2015-04-27 DIAGNOSIS — R5381 Other malaise: Secondary | ICD-10-CM | POA: Diagnosis not present

## 2015-04-27 DIAGNOSIS — N400 Enlarged prostate without lower urinary tract symptoms: Secondary | ICD-10-CM | POA: Diagnosis not present

## 2015-04-27 NOTE — Progress Notes (Signed)
Patient ID: Bradley [name], male   DOB: November 04, 1941, 74 y.o.   MRN: [id number]   04/27/2015  Facility:  Nursing Home Location:  [place] Room Number: 1101-1 LEVEL OF CARE:  SNF (31)   Chief Complaint  Patient presents with  . Discharge Note    Physical deconditioning, hypertension, chronic diastolic heart failure, hyperlipidemia, toxic nodular goiter, hypokalemia and BPH    HISTORY OF PRESENT ILLNESS:  This is a 74 year old male who is for discharge home with Home health PT for gait transfers and strengthening, OT for bathing, dressing, toileting and self-care skills, ST for dysphagia, cognition/problem solving, Home health Aide for ADL assistance and bathing and Social worker for community resources/referrals. He has been admitted to [facility] on 04/05/15 from [facility]. He has PMH of hypertension, anemia, hyperlipidemia, and CHF. He presented to the hospital with difficulty ambulating due to right leg weakness and confusion. UA was consistent with UTI and was started on levofloxacin. However chest x-ray shows right-sided infiltrates and aspiration pneumonia was suspected. Antibiotic was switched to Zosyn. He was also diagnosed with acute diastolic heart failure. He was given several PRN Lasix.  Pneumonia and UTI has now resolved.  Patient was admitted to this facility for short-term rehabilitation after the patient's recent hospitalization.  Patient has completed SNF rehabilitation and therapy has cleared the patient for discharge.  PAST MEDICAL HISTORY:  Past Medical History  Diagnosis Date  . Stroke   . Hypertension   . Hyperlipidemia   . Aortic stenosis   . CHF (congestive heart failure)   . MGUS (monoclonal gammopathy of unknown significance)   . Goiter   . Rhabdomyolysis   . UTI (urinary tract infection) 05/09/2014    CURRENT MEDICATIONS: Reviewed per MAR/see medication list  No Known Allergies   REVIEW OF SYSTEMS:  GENERAL: no  change in appetite, no fatigue, no weight changes, no fever, chills or weakness RESPIRATORY: no cough, SOB, DOE, wheezing, hemoptysis CARDIAC: no chest pain, edema or palpitations GI: no abdominal pain, diarrhea, constipation, heart burn, nausea or vomiting  PHYSICAL EXAMINATION  GENERAL: no acute distress, normal body habitus EYES: conjunctivae normal, sclerae normal, normal eye lids NECK: supple, trachea midline, no neck masses, no thyroid tenderness, no thyromegaly, left neck soft mass, nontender  and multiple small masses on trunk and BUE (had it for several years) LYMPHATICS: no LAN in the neck, no supraclavicular LAN RESPIRATORY: breathing is even & unlabored, BS CTAB CARDIAC: RRR, no murmur,no extra heart sounds, no edema GI: abdomen soft, normal BS, no masses, no tenderness, no hepatomegaly, no splenomegaly EXTREMITIES:  Able to move X 4 extremities PSYCHIATRIC: the patient is alert & oriented to person, affect & behavior appropriate  LABS/RADIOLOGY: Labs reviewed: 04/25/15  sodium 141 potassium 4.4 glucose 81 BUN 25 creatinine 1.15 calcium 9.4 04/18/15  sodium 147 potassium 4.1 glucose 84 BUN 51 creatinine 1.86 calcium 9.3 04/17/15  WBC 9.3 hemoglobin 10.9 hematocrit 34.0 MCV 84.6 sodium 145 potassium 4.4 glucose 105 BUN 55 creatinine 2.58 total bilirubin 0.5 alkaline phosphatase 72 SGOT 24 SGPT 47 total protein 7.1 albumin Green 3.6 calcium 9.6 04/16/15   WBC 9.8 hemoglobin 11.3 hematocrit 34.8 MCV 83.1 sodium 145 potassium 4.7 glucose 135 BUN 15 creatinine 2.06 calcium 10.1 Chest x-ray is consistent with left basilar aspiration pneumonia or pneumonitis, cardiomegaly without overt congestive heart failure - treated with Augmentin 04/09/15  WBC 5.4 hemoglobin 10.4 hematocrit 33.0 MCV 84.4 sodium 134 potassium 5.3 glucose 80 BUN  37 creatinine 1.45 total bilirubin 0.5 alkaline phosphatase 79 SGOT 71  SGPT 79 total protein 7.5 albumin 3.5 calcium 9.5   Basic Metabolic Panel:  Recent  Labs  04/03/15 0646 04/04/15 0608 04/05/15 0611  NA 143 141 146*  K 4.5 4.7 5.0  CL 111 110 116*  CO2 23 23 19   GLUCOSE 123* 112* 112*  BUN 55* 64* 59*  CREATININE 1.93* 1.96* 1.76*  CALCIUM 9.7 9.5 9.9   Liver Function Tests:  Recent Labs  04/01/15 0705  AST 22  ALT 23  ALKPHOS 79  BILITOT 0.7  PROT 7.4  ALBUMIN 3.1*   CBC:  Recent Labs  05/08/14 1201  03/31/15 1512  04/03/15 0646 04/04/15 0608 04/05/15 0611  WBC 19.3*  < > 8.9  < > 6.6 6.3 6.0  NEUTROABS 18.0*  --  7.2  --   --   --   --   HGB 10.7*  < > 10.1*  < > 9.6* 8.8* 9.8*  HCT 33.1*  < > 31.4*  < > 29.6* 27.4* 30.9*  MCV 87.6  < > 84.6  < > 83.6 83.0 83.7  PLT 127*  < > 152  < > 163 181 209  < > = values in this interval not displayed.  Cardiac Enzymes:  Recent Labs  04/01/15 1557 04/01/15 2120 04/02/15 0306  TROPONINI 0.19* 0.39* 0.51*   CBG:  Recent Labs  05/08/14 1156  GLUCAP 97    Dg Chest Port 1 View  04/04/2015   CLINICAL DATA:  Pneumonia  EXAM: PORTABLE CHEST - 1 VIEW  COMPARISON:  04/02/2015; 03/31/2015; 11/16/2014  FINDINGS: Grossly unchanged enlarged cardiac silhouette and mediastinal contours with atherosclerotic plaque within the thoracic aorta. Ill-defined heterogeneous opacities within the right mid lung have minimally worsened in the interval. Pulmonary vasculature is less distinct than present examination. No definite pleural effusion or pneumothorax. Unchanged bones. A minimal amount of enteric contrast is seen within the stomach and splenic flexure of the colon.  IMPRESSION: Findings suggestive of worsening pulmonary edema with worsening superimposed heterogeneous airspace opacities within the right mid lung with differential considerations including asymmetric alveolar pulmonary edema versus underlying infection. Continued attention on follow-up is recommended.   Electronically Signed   By: Sandi Mariscal M.D.   On: 04/04/2015 07:37   Dg Chest Port 1 View  04/02/2015   CLINICAL  DATA:  Congestive heart failure.  Urinary tract infection.  EXAM: PORTABLE CHEST - 1 VIEW  COMPARISON:  03/31/2015  FINDINGS: New asymmetric airspace disease is seen throughout the right lung, and perhaps to a lesser degree in the retrocardiac left lower lobe. No definite pleural effusion seen on this portable exam. Cardiomegaly is stable.  IMPRESSION: New asymmetric airspace disease, which may be due to pneumonia or asymmetric pulmonary edema.  Stable cardiomegaly.   Electronically Signed   By: Earle Gell M.D.   On: 04/02/2015 14:05   Dg Chest Port 1 View  03/31/2015   CLINICAL DATA:  Fatigue  EXAM: PORTABLE CHEST - 1 VIEW  COMPARISON:  11/16/2014  FINDINGS: Cardiac shadow is enlarged. Aortic calcifications are again noted. No focal infiltrate or sizable effusion is seen. No bony abnormality is noted.  IMPRESSION: No active disease.   Electronically Signed   By: Inez Catalina M.D.   On: 03/31/2015 15:56   Dg Swallowing Func-speech Pathology  04/03/2015     Objective Swallowing Evaluation:    Patient Details  Name: Bradley [name] MRN: [id number] Date of Birth: 10-26-1941  Today's Date: 04/03/2015 Time: SLP Start Time (ACUTE ONLY): 1335-SLP Stop Time (ACUTE ONLY): 1405 SLP Time Calculation (min) (ACUTE ONLY): 30 min  Past Medical History:  Past Medical History  Diagnosis Date  . Stroke   . Hypertension   . Hyperlipidemia   . Aortic stenosis   . CHF (congestive heart failure)   . MGUS (monoclonal gammopathy of unknown significance)   . Goiter   . Rhabdomyolysis   . UTI (urinary tract infection) 05/09/2014   Past Surgical History:  Past Surgical History  Procedure Laterality Date  . Circumcision     HPI:  HPI: 74 year old male with history of stroke with residual right-sided  deficits who presents with difficulty ambulating due to right leg weakness  and confusion. Chest x-ray with new asymmetric airspace disease, which may  be due to pneumonia.  Daughter denies difficulty with liquids previously,  but that chewing foods  was difficult.  Daughter also reports history of  thyroid issues; SLP able to observe enlarged area on right side of neck.     No Data Recorded  Assessment / Plan / Recommendation CHL IP CLINICAL IMPRESSIONS 04/03/2015  Dysphagia Diagnosis Mild oral phase dysphagia;Moderate pharyngeal phase  dysphagia  Clinical impression Pt presents with mild oral and moderate pharyngeal  phase dysphagia characterized by delayed oral transit, delayed swallow  initiation to the pyriforms. Pt aspirated thin liquids before the swallow  with sensation. Flash penetration noted with nectar-thick liquids.  Noted  lingual residue post-swallow, which then pools in valleculae, suspect due  to reduced bolus manipulation. Residue removed following cued dry swallow.  Recommend pt initiate dys 3/ nectar thick liquid diet; small sips; two  swallows following sips of liquids. Pt still at risk for aspiration given  delayed swallow initiation and base of tongue weakness. Speech will  follow-up with diet tolerance.       CHL IP TREATMENT RECOMMENDATION 04/03/2015  Treatment Plan Recommendations Therapy as outlined in treatment plan below      CHL IP DIET RECOMMENDATION 04/03/2015  Diet Recommendations Dysphagia 3 (Mechanical Soft);Nectar-thick liquid  Liquid Administration via Straw;Cup  Medication Administration Whole meds with liquid  Compensations Slow rate;Small sips/bites;Multiple dry swallows after each  bite/sip  Postural Changes and/or Swallow Maneuvers Seated upright 90 degrees     CHL IP OTHER RECOMMENDATIONS 04/03/2015  Recommended Consults (None)  Oral Care Recommendations Oral care BID  Other Recommendations Have oral suction available     CHL IP FOLLOW UP RECOMMENDATIONS 04/03/2015  Follow up Recommendations Other (comment)     CHL IP FREQUENCY AND DURATION 04/03/2015  Speech Therapy Frequency (ACUTE ONLY) min 2x/week  Treatment Duration 2 weeks     Pertinent Vitals/Pain NA    SLP Swallow Goals No flowsheet data found.  No flowsheet data found.     CHL IP REASON FOR REFERRAL 04/03/2015  Reason for Referral Objectively evaluate swallowing function     CHL IP ORAL PHASE 04/03/2015  Lips (None)  Tongue (None)  Mucous membranes (None)  Nutritional status (None)  Other (None)  Oxygen therapy (None)  Oral Phase Impaired  Oral - Pudding Teaspoon (None)  Oral - Pudding Cup (None)  Oral - Honey Teaspoon (None)  Oral - Honey Cup (None)  Oral - Honey Syringe (None)  Oral - Nectar Teaspoon (None)  Oral - Nectar Cup (None)  Oral - Nectar Straw Lingual/palatal residue  Oral - Nectar Syringe (None)  Oral - Ice Chips (None)  Oral - Thin Teaspoon (None)  Oral - Thin Cup (  None)  Oral - Thin Straw Lingual/palatal residue  Oral - Thin Syringe (None)  Oral - Puree Delayed oral transit;Lingual/palatal residue  Oral - Mechanical Soft (None)  Oral - Regular Delayed oral transit;Other (Comment);Lingual/palatal  residue  Oral - Multi-consistency (None)  Oral - Pill (None)  Oral Phase - Comment (None)      CHL IP PHARYNGEAL PHASE 04/03/2015  Pharyngeal Phase Impaired  Pharyngeal - Pudding Teaspoon (None)  Penetration/Aspiration details (pudding teaspoon) (None)  Pharyngeal - Pudding Cup (None)  Penetration/Aspiration details (pudding cup) (None)  Pharyngeal - Honey Teaspoon (None)  Penetration/Aspiration details (honey teaspoon) (None)  Pharyngeal - Honey Cup (None)  Penetration/Aspiration details (honey cup) (None)  Pharyngeal - Honey Syringe (None)  Penetration/Aspiration details (honey syringe) (None)  Pharyngeal - Nectar Teaspoon (None)  Penetration/Aspiration details (nectar teaspoon) (None)  Pharyngeal - Nectar Cup Delayed swallow initiation;Premature spillage to  pyriform sinuses;Pharyngeal residue - valleculae;Penetration/Aspiration  during swallow  Penetration/Aspiration details (nectar cup) Material enters airway,  remains ABOVE vocal cords then ejected out  Pharyngeal - Nectar Straw (None)  Penetration/Aspiration details (nectar straw) (None)  Pharyngeal - Nectar Syringe (None)   Penetration/Aspiration details (nectar syringe) (None)  Pharyngeal - Ice Chips (None)  Penetration/Aspiration details (ice chips) (None)  Pharyngeal - Thin Teaspoon (None)  Penetration/Aspiration details (thin teaspoon) (None)  Pharyngeal - Thin Cup (None)  Penetration/Aspiration details (thin cup) (None)  Pharyngeal - Thin Straw Delayed swallow initiation;Premature spillage to  pyriform sinuses;Penetration/Aspiration before swallow  Penetration/Aspiration details (thin straw) Material enters airway, passes  BELOW cords and not ejected out despite cough attempt by patient  Pharyngeal - Thin Syringe (None)  Penetration/Aspiration details (thin syringe') (None)  Pharyngeal - Puree Delayed swallow initiation;Premature spillage to  valleculae;Pharyngeal residue - valleculae  Penetration/Aspiration details (puree) (None)  Pharyngeal - Mechanical Soft (None)  Penetration/Aspiration details (mechanical soft) (None)  Pharyngeal - Regular Delayed swallow initiation;Premature spillage to  valleculae;Pharyngeal residue - valleculae  Penetration/Aspiration details (regular) (None)  Pharyngeal - Multi-consistency (None)  Penetration/Aspiration details (multi-consistency) (None)  Pharyngeal - Pill Delayed swallow initiation  Penetration/Aspiration details (pill) (None)  Pharyngeal Comment (None)     CHL IP CERVICAL ESOPHAGEAL PHASE 04/03/2015  Cervical Esophageal Phase WFL  Pudding Teaspoon (None)  Pudding Cup (None)  Honey Teaspoon (None)  Honey Cup (None)  Honey Syringe (None)  Nectar Teaspoon (None)  Nectar Cup (None)  Nectar Straw (None)  Nectar Syringe (None)  Thin Teaspoon (None)  Thin Cup (None)  Thin Straw (None)  Thin Syringe (None)  Cervical Esophageal Comment (None)    No flowsheet data found.        Herbie Baltimore, MA CCC-SLP [phone]  Lynann Beaver 04/03/2015, 3:08 PM     ASSESSMENT/PLAN:  Physical deconditioning - for home health PT, OT, ST, home health aide and social worker Chronic systolic heart  failure - continue Lasix 40 mg PO Q D BPH - continue Flomax 0.4 mg by mouth 2 times a day Hypokalemia - K4.4; continue KCl to 20 meq PO Q other day Hypertension - continue Normodyne 300 mg by mouth twice a day, clonidine 0.3 mg/24 hour 1 patch every week, Apresoline 100 mg by mouth every morning and 50 mg by mouth daily at bedtime and Norvasc 10 mg by mouth daily Hyperlipidemia - continue atorvastatin 10 mg by mouth daily CKD, stage III - creatinine 1.15; stable Toxic Nodular Goiter -   Follows-up with Dr. Dwyane Dee, endocrinology   I have filled out patient's discharge paperwork and written prescriptions.  Patient will receive  home health PT, OT, ST, Social worker and Home health Aide.  Total discharge time: Greater  than 30 minutes  Discharge time involved coordination of the discharge process with social worker, nursing staff and therapy department. Medical justification for home health services verified.    [facility], NP [employer] [phone]

## 2015-05-18 ENCOUNTER — Ambulatory Visit: Payer: Medicare Other | Admitting: Endocrinology

## 2015-05-21 ENCOUNTER — Ambulatory Visit: Payer: Medicare Other | Admitting: Internal Medicine

## 2015-05-24 ENCOUNTER — Ambulatory Visit (INDEPENDENT_AMBULATORY_CARE_PROVIDER_SITE_OTHER): Payer: Medicare Other | Admitting: Internal Medicine

## 2015-05-24 ENCOUNTER — Encounter: Payer: Self-pay | Admitting: Internal Medicine

## 2015-05-24 VITALS — BP 100/48 | HR 60 | Ht 65.0 in | Wt 124.8 lb

## 2015-05-24 DIAGNOSIS — I519 Heart disease, unspecified: Secondary | ICD-10-CM | POA: Diagnosis not present

## 2015-05-24 DIAGNOSIS — I509 Heart failure, unspecified: Secondary | ICD-10-CM

## 2015-05-24 LAB — BASIC METABOLIC PANEL
BUN: 17 mg/dL (ref 6–23)
CO2: 28 meq/L (ref 19–32)
Calcium: 9.7 mg/dL (ref 8.4–10.5)
Chloride: 102 mEq/L (ref 96–112)
Creatinine, Ser: 1.16 mg/dL (ref 0.40–1.50)
GFR: 79.28 mL/min (ref >60.00)
GLUCOSE: 136 mg/dL — AB (ref 70–99)
Potassium: 4.4 mEq/L (ref 3.5–5.1)
SODIUM: 135 meq/L (ref 135–145)

## 2015-05-24 LAB — BRAIN NATRIURETIC PEPTIDE: Pro B Natriuretic peptide (BNP): 789 pg/mL — ABNORMAL HIGH (ref 0.0–100.0)

## 2015-05-24 NOTE — Progress Notes (Signed)
Cardiology Office Note   Date:  05/24/2015   ID:  [name], [name] 1941-03-28, MRN [id number]  PCP:  [name], MD  Cardiologist:   Bradley Carnes, MD   No chief complaint on file.    F/U of aortic stenosis.  History of Present Illness: Bradley [name] is a 74 y.o. male with a history of HTN, AS, CVA )(most recent 02/2012). Also has a history of rhabdomyolysis Last showed LVEF was 60 to 65% Mean gradient through the valve was 43 mm Hg consistent with severe AS> Also in the nospital had asymptomatic NSVT. Myoview in 2012 was normal.  Since I saw him in clinic he was hospitalized.  Seen by Ezzie Dural  Not felt to be a candidate for invasive repair of aortic valve  Plan for palliation The pt comes in today with daughter  He denies CP  Breathing is OK  No palpitations.  No SOB  No dizziness  No PND  Appetite is good       Current Outpatient Prescriptions  Medication Sig Dispense Refill  . albuterol (PROVENTIL) (2.5 MG/3ML) 0.083% nebulizer solution Take 3 mLs (2.5 mg total) by nebulization every 4 (four) hours as needed for wheezing or shortness of breath. 75 mL 0  . amLODipine (NORVASC) 10 MG tablet Take 10 mg by mouth daily.    Marland Kitchen amoxicillin-clavulanate (AUGMENTIN) 500-125 MG per tablet Take 1 tablet (500 mg total) by mouth every 12 (twelve) hours. 3 more days from 04/05/15 and then stop    . aspirin 81 MG tablet Take 81 mg by mouth daily.    Marland Kitchen atorvastatin (LIPITOR) 10 MG tablet Take 10 mg by mouth daily.    . cephALEXin (KEFLEX) 500 MG capsule Take 500 mg by mouth 3 (three) times daily.  0  . cloNIDine (CATAPRES - DOSED IN MG/24 HR) 0.3 mg/24hr Place 1 patch onto the skin once a week. Sundays    . furosemide (LASIX) 40 MG tablet Take 40 mg by mouth daily.  0  . guaiFENesin (MUCINEX) 600 MG 12 hr tablet Take 1 tablet (600 mg total) by mouth 2 (two) times daily.    . hydrALAZINE (APRESOLINE) 50 MG tablet Take 2 tablets (100 mg total) by mouth every morning.    Marland Kitchen  ipratropium-albuterol (DUONEB) 0.5-2.5 (3) MG/3ML SOLN Take 3 mLs by nebulization 2 (two) times daily. 360 mL   . labetalol (NORMODYNE) 300 MG tablet Take 1 tablet (300 mg total) by mouth 2 (two) times daily.    . potassium chloride SA (K-DUR,KLOR-CON) 20 MEQ tablet Take 1 tablet (20 mEq total) by mouth daily.    . Tamsulosin HCl (FLOMAX) 0.4 MG CAPS Take 0.4 mg by mouth 2 (two) times daily.      No current facility-administered medications for this visit.    Allergies:   Review of patient's allergies indicates no known allergies.   Past Medical History  Diagnosis Date  . Stroke   . Hypertension   . Hyperlipidemia   . Aortic stenosis   . CHF (congestive heart failure)   . MGUS (monoclonal gammopathy of unknown significance)   . Goiter   . Rhabdomyolysis   . UTI (urinary tract infection) 05/09/2014    Past Surgical History  Procedure Laterality Date  . Circumcision       Social History:  The patient  reports that he has never smoked. He has never used smokeless tobacco. He reports that he does not drink alcohol or use illicit drugs.  Family History:  The patient's Family history is unknown by patient.    ROS:  Please see the history of present illness. All other systems are reviewed and  Negative to the above problem except as noted.    PHYSICAL EXAM: VS:  BP 100/48 mmHg  Pulse 60  Ht 5\' 5"  (1.651 m)  Wt 124 lb 12.8 oz (56.609 kg)  BMI 20.77 kg/m2  GEN: Well nourished, well developed, in no acute distress HEENT: normal Neck: no JVD, carotid bruits, or masses Cardiac: RRR;  Gr III/VI systolic murmur at base  No rubs, or gallops,no edema  Respiratory:  clear to auscultation bilaterally, normal work of breathing GI: soft, nontender, nondistended, + BS  No hepatomegaly  MS: no deformity Moving all extremities   Skin: warm and dry, no rash Neuro:  Strength and sensation are intact Psych: euthymic mood, full affect   EKG:  EKG is not ordered today.   Lipid Panel      Component Value Date/Time   CHOL 198 03/11/2012 0655   TRIG 79 03/11/2012 0655   HDL 54 03/11/2012 0655   CHOLHDL 3.7 03/11/2012 0655   VLDL 16 03/11/2012 0655   LDLCALC 128* 03/11/2012 0655      Wt Readings from Last 3 Encounters:  05/24/15 124 lb 12.8 oz (56.609 kg)  04/27/15 127 lb 12.8 oz (57.97 kg)  04/13/15 146 lb (66.225 kg)      ASSESSMENT AND PLAN: 1.  Aortic stenosis  Severe  Not a candidate for intervention (surgical or percutaneously)  Plan to continue close f/u   On exam, pts volume status looks good  Watch fluids and salt.  2.  HTN  Good control  3.  Hx CVA  4.  Dispo  Pt is living at home  [name] is coming  Patient is receiving PT  Ovreall I think he is doing very well for current condition.  Would favor continuing      Current medicines are reviewed at length with the patient today.  The patient does not have concerns regarding medicines.  The following changes have been made:   Labs/ tests ordered today include:  BMET and BNP   No orders of the defined types were placed in this encounter.     Disposition:   FU with me in the winter    Signed, Bradley Carnes, MD  05/24/2015 8:39 AM    [address], [place], [place]  [address] Phone: [phone]; Fax: [phone]

## 2015-05-24 NOTE — Patient Instructions (Signed)
Medication Instructions:  None  Labwork: bnp/bmet  Testing/Procedures: None   Follow-Up: Your physician wants you to follow-up in: 5 months.  You will receive a reminder letter in the mail two months in advance. If you don't receive a letter, please call our office to schedule the follow-up appointment.   Any Other Special Instructions Will Be Listed Below (If Applicable).

## 2015-05-25 ENCOUNTER — Ambulatory Visit: Payer: Medicare Other | Admitting: Internal Medicine

## 2015-05-28 ENCOUNTER — Other Ambulatory Visit: Payer: Self-pay | Admitting: Adult Health

## 2015-05-30 ENCOUNTER — Other Ambulatory Visit: Payer: Self-pay | Admitting: Adult Health

## 2015-06-03 ENCOUNTER — Other Ambulatory Visit: Payer: Self-pay | Admitting: Adult Health

## 2015-06-19 ENCOUNTER — Telehealth: Payer: Self-pay | Admitting: Internal Medicine

## 2015-06-19 ENCOUNTER — Emergency Department (HOSPITAL_COMMUNITY)
Admission: EM | Admit: 2015-06-19 | Discharge: 2015-06-19 | Disposition: A | Discharge status: Home / Self Care | Payer: Medicare Other | Attending: Emergency Medicine | Admitting: Emergency Medicine

## 2015-06-19 ENCOUNTER — Encounter (HOSPITAL_COMMUNITY): Payer: Self-pay | Admitting: Emergency Medicine

## 2015-06-19 DIAGNOSIS — Z8673 Personal history of transient ischemic attack (TIA), and cerebral infarction without residual deficits: Secondary | ICD-10-CM | POA: Insufficient documentation

## 2015-06-19 DIAGNOSIS — I10 Essential (primary) hypertension: Secondary | ICD-10-CM | POA: Insufficient documentation

## 2015-06-19 DIAGNOSIS — Z792 Long term (current) use of antibiotics: Secondary | ICD-10-CM | POA: Insufficient documentation

## 2015-06-19 DIAGNOSIS — Z8739 Personal history of other diseases of the musculoskeletal system and connective tissue: Secondary | ICD-10-CM | POA: Diagnosis not present

## 2015-06-19 DIAGNOSIS — K6289 Other specified diseases of anus and rectum: Secondary | ICD-10-CM | POA: Diagnosis present

## 2015-06-19 DIAGNOSIS — R319 Hematuria, unspecified: Secondary | ICD-10-CM | POA: Insufficient documentation

## 2015-06-19 DIAGNOSIS — Z862 Personal history of diseases of the blood and blood-forming organs and certain disorders involving the immune mechanism: Secondary | ICD-10-CM | POA: Insufficient documentation

## 2015-06-19 DIAGNOSIS — E785 Hyperlipidemia, unspecified: Secondary | ICD-10-CM | POA: Diagnosis not present

## 2015-06-19 DIAGNOSIS — K644 Residual hemorrhoidal skin tags: Secondary | ICD-10-CM | POA: Insufficient documentation

## 2015-06-19 DIAGNOSIS — I509 Heart failure, unspecified: Secondary | ICD-10-CM | POA: Insufficient documentation

## 2015-06-19 DIAGNOSIS — I350 Nonrheumatic aortic (valve) stenosis: Secondary | ICD-10-CM

## 2015-06-19 DIAGNOSIS — Z79899 Other long term (current) drug therapy: Secondary | ICD-10-CM | POA: Insufficient documentation

## 2015-06-19 DIAGNOSIS — R339 Retention of urine, unspecified: Secondary | ICD-10-CM | POA: Insufficient documentation

## 2015-06-19 DIAGNOSIS — Z87440 Personal history of urinary (tract) infections: Secondary | ICD-10-CM | POA: Diagnosis not present

## 2015-06-19 DIAGNOSIS — K649 Unspecified hemorrhoids: Secondary | ICD-10-CM

## 2015-06-19 LAB — COMPREHENSIVE METABOLIC PANEL
ALT: 21 U/L (ref 17–63)
ANION GAP: 10 (ref 5–15)
AST: 20 U/L (ref 15–41)
Albumin: 3.3 g/dL — ABNORMAL LOW (ref 3.5–5.0)
Alkaline Phosphatase: 83 U/L (ref 38–126)
BUN: 24 mg/dL — ABNORMAL HIGH (ref 6–20)
CO2: 26 mmol/L (ref 22–32)
Calcium: 9.6 mg/dL (ref 8.9–10.3)
Chloride: 101 mmol/L (ref 101–111)
Creatinine, Ser: 1.39 mg/dL — ABNORMAL HIGH (ref 0.61–1.24)
GFR calc Af Amer: 56 mL/min — ABNORMAL LOW (ref >60)
GFR calc non Af Amer: 49 mL/min — ABNORMAL LOW (ref >60)
Glucose, Bld: 139 mg/dL — ABNORMAL HIGH (ref 65–99)
POTASSIUM: 4.3 mmol/L (ref 3.5–5.1)
SODIUM: 137 mmol/L (ref 135–145)
TOTAL PROTEIN: 7.3 g/dL (ref 6.5–8.1)
Total Bilirubin: 0.4 mg/dL (ref 0.3–1.2)

## 2015-06-19 LAB — URINALYSIS, ROUTINE W REFLEX MICROSCOPIC
Bilirubin Urine: NEGATIVE
GLUCOSE, UA: NEGATIVE mg/dL
KETONES UR: NEGATIVE mg/dL
Nitrite: NEGATIVE
Protein, ur: 100 mg/dL — AB
Specific Gravity, Urine: 1.013 (ref 1.005–1.030)
Urobilinogen, UA: 0.2 mg/dL (ref 0.0–1.0)
pH: 6 (ref 5.0–8.0)

## 2015-06-19 LAB — CBC WITH DIFFERENTIAL/PLATELET
BASOS PCT: 0 % (ref 0–1)
Basophils Absolute: 0 10*3/uL (ref 0.0–0.1)
EOS PCT: 0 % (ref 0–5)
Eosinophils Absolute: 0 10*3/uL (ref 0.0–0.7)
HCT: 32.4 % — ABNORMAL LOW (ref 39.0–52.0)
Hemoglobin: 10.1 g/dL — ABNORMAL LOW (ref 13.0–17.0)
Lymphocytes Relative: 8 % — ABNORMAL LOW (ref 12–46)
Lymphs Abs: 0.8 10*3/uL (ref 0.7–4.0)
MCH: 25.1 pg — ABNORMAL LOW (ref 26.0–34.0)
MCHC: 31.2 g/dL (ref 30.0–36.0)
MCV: 80.4 fL (ref 78.0–100.0)
MONO ABS: 0.9 10*3/uL (ref 0.1–1.0)
Monocytes Relative: 9 % (ref 3–12)
NEUTROS ABS: 8.4 10*3/uL — AB (ref 1.7–7.7)
Neutrophils Relative %: 83 % — ABNORMAL HIGH (ref 43–77)
Platelets: 178 10*3/uL (ref 150–400)
RBC: 4.03 MIL/uL — ABNORMAL LOW (ref 4.22–5.81)
RDW: 15.6 % — ABNORMAL HIGH (ref 11.5–15.5)
WBC: 10.1 10*3/uL (ref 4.0–10.5)

## 2015-06-19 LAB — LIPASE, BLOOD: Lipase: 12 U/L — ABNORMAL LOW (ref 22–51)

## 2015-06-19 LAB — URINE MICROSCOPIC-ADD ON

## 2015-06-19 MED ORDER — SODIUM CHLORIDE 0.9 % IV SOLN: 1000.0000 mL | INTRAVENOUS | Status: DC

## 2015-06-19 MED ORDER — ONDANSETRON HCL 4 MG/2ML IJ SOLN
4.0000 mg | Freq: Once | INTRAMUSCULAR | Status: AC
  Administered 2015-06-19: 4 mg via INTRAVENOUS
  Filled 2015-06-19: qty 2

## 2015-06-19 MED ORDER — PRAMOXINE HCL 1 % RE FOAM: 1.0000 "application " | Freq: Three times a day (TID) | RECTAL | Status: AC | PRN

## 2015-06-19 MED ORDER — DOCUSATE SODIUM 100 MG PO CAPS: 100.0000 mg | ORAL_CAPSULE | Freq: Two times a day (BID) | ORAL | Status: AC

## 2015-06-19 MED ORDER — MORPHINE SULFATE 4 MG/ML IJ SOLN
4.0000 mg | Freq: Once | INTRAMUSCULAR | Status: AC
  Administered 2015-06-19: 4 mg via INTRAVENOUS
  Filled 2015-06-19: qty 1

## 2015-06-19 MED ORDER — SODIUM CHLORIDE 0.9 % IV SOLN
1000.0000 mL | Freq: Once | INTRAVENOUS | Status: AC
  Administered 2015-06-19: 1000 mL via INTRAVENOUS

## 2015-06-19 NOTE — ED Notes (Signed)
D/c instructions reviewed w/ pt and family - pt and family deny any further questions or concerns at present. Rx given x2  

## 2015-06-19 NOTE — Discharge Instructions (Signed)
Hematuria Hematuria is blood in your urine. It can be caused by a bladder infection, kidney infection, prostate infection, kidney stone, or cancer of your urinary tract. Infections can usually be treated with medicine, and a kidney stone usually will pass through your urine. If neither of these is the cause of your hematuria, further workup to find out the reason may be needed. It is very important that you tell your health care provider about any blood you see in your urine, even if the blood stops without treatment or happens without causing pain. Blood in your urine that happens and then stops and then happens again can be a symptom of a very serious condition. Also, pain is not a symptom in the initial stages of many urinary cancers. HOME CARE INSTRUCTIONS   Drink lots of fluid, 3-4 quarts a day. If you have been diagnosed with an infection, cranberry juice is especially recommended, in addition to large amounts of water.  Avoid caffeine, tea, and carbonated beverages because they tend to irritate the bladder.  Avoid alcohol because it may irritate the prostate.  Take all medicines as directed by your health care provider.  If you were prescribed an antibiotic medicine, finish it all even if you start to feel better.  If you have been diagnosed with a kidney stone, follow your health care provider's instructions regarding straining your urine to catch the stone.  Empty your bladder often. Avoid holding urine for long periods of time.  After a bowel movement, women should cleanse front to back. Use each tissue only once.  Empty your bladder before and after sexual intercourse if you are a male. SEEK MEDICAL CARE IF:  You develop back pain.  You have a fever.  You have a feeling of sickness in your stomach (nausea) or vomiting.  Your symptoms are not better in 3 days. Return sooner if you are getting worse. SEEK IMMEDIATE MEDICAL CARE IF:   You develop severe vomiting and are  unable to keep the medicine down.  You develop severe back or abdominal pain despite taking your medicines.  You begin passing a large amount of blood or clots in your urine.  You feel extremely weak or faint, or you pass out. MAKE SURE YOU:   Understand these instructions.  Will watch your condition.  Will get help right away if you are not doing well or get worse. Document Released: 12/15/2005 Document Revised: 05/01/2014 Document Reviewed: 08/15/2013 [facility] Patient Information 2015 [place], [place]. This information is not intended to replace advice given to you by your health care provider. Make sure you discuss any questions you have with your health care provider.  Hemorrhoids Hemorrhoids are swollen veins around the rectum or anus. There are two types of hemorrhoids:   Internal hemorrhoids. These occur in the veins just inside the rectum. They may poke through to the outside and become irritated and painful.  External hemorrhoids. These occur in the veins outside the anus and can be felt as a painful swelling or hard lump near the anus. CAUSES  Pregnancy.   Obesity.   Constipation or diarrhea.   Straining to have a bowel movement.   Sitting for long periods on the toilet.  Heavy lifting or other activity that caused you to strain.  Anal intercourse. SYMPTOMS   Pain.   Anal itching or irritation.   Rectal bleeding.   Fecal leakage.   Anal swelling.   One or more lumps around the anus.  DIAGNOSIS  Your caregiver  may be able to diagnose hemorrhoids by visual examination. Other examinations or tests that may be performed include:   Examination of the rectal area with a gloved hand (digital rectal exam).   Examination of anal canal using a small tube (scope).   A blood test if you have lost a significant amount of blood.  A test to look inside the colon (sigmoidoscopy or colonoscopy). TREATMENT Most hemorrhoids can be treated at home.  However, if symptoms do not seem to be getting better or if you have a lot of rectal bleeding, your caregiver may perform a procedure to help make the hemorrhoids get smaller or remove them completely. Possible treatments include:   Placing a rubber band at the base of the hemorrhoid to cut off the circulation (rubber band ligation).   Injecting a chemical to shrink the hemorrhoid (sclerotherapy).   Using a tool to burn the hemorrhoid (infrared light therapy).   Surgically removing the hemorrhoid (hemorrhoidectomy).   Stapling the hemorrhoid to block blood flow to the tissue (hemorrhoid stapling).  HOME CARE INSTRUCTIONS   Eat foods with fiber, such as whole grains, beans, nuts, fruits, and vegetables. Ask your doctor about taking products with added fiber in them (fibersupplements).  Increase fluid intake. Drink enough water and fluids to keep your urine clear or pale yellow.   Exercise regularly.   Go to the bathroom when you have the urge to have a bowel movement. Do not wait.   Avoid straining to have bowel movements.   Keep the anal area dry and clean. Use wet toilet paper or moist towelettes after a bowel movement.   Medicated creams and suppositories may be used or applied as directed.   Only take over-the-counter or prescription medicines as directed by your caregiver.   Take warm sitz baths for 15-20 minutes, 3-4 times a day to ease pain and discomfort.   Place ice packs on the hemorrhoids if they are tender and swollen. Using ice packs between sitz baths may be helpful.   Put ice in a plastic bag.   Place a towel between your skin and the bag.   Leave the ice on for 15-20 minutes, 3-4 times a day.   Do not use a donut-shaped pillow or sit on the toilet for long periods. This increases blood pooling and pain.  SEEK MEDICAL CARE IF:  You have increasing pain and swelling that is not controlled by treatment or medicine.  You have uncontrolled  bleeding.  You have difficulty or you are unable to have a bowel movement.  You have pain or inflammation outside the area of the hemorrhoids. MAKE SURE YOU:  Understand these instructions.  Will watch your condition.  Will get help right away if you are not doing well or get worse. Document Released: 12/12/2000 Document Revised: 12/01/2012 Document Reviewed: 10/19/2012 [facility] Patient Information 2015 [place], [place]. This information is not intended to replace advice given to you by your health care provider. Make sure you discuss any questions you have with your health care provider.

## 2015-06-19 NOTE — Telephone Encounter (Signed)
Called and talked to patient's daughter Lurena Joiner . Daughter stated that patient's home health service ended and that she had talked to Dr. Harrington Challenger, at patient's last office visit, about continuing home health nurse visits once or twice a week. Informed patient's daughter that this message will be forwarded to Dr. Harrington Challenger.

## 2015-06-19 NOTE — ED Notes (Signed)
Pt states that pt has hemmrrhoids that are causing him pain and he also has groin pain that causes him to 'shake' when he tries to urinate.  Alert and oriented.

## 2015-06-19 NOTE — ED Provider Notes (Signed)
CSN: [id number]     Arrival date & time 06/19/15  1745 History   First MD Initiated Contact with Patient 06/19/15 1809     Chief Complaint  Patient presents with  . Rectal Pain  . Groin Pain   HPI  patient presents to the emergency room with 2 complaints. The patient has a history of hemorrhoids for several months. He's had some intermittent issues with them. Over the last few days they have become inflamed and irritated. His pain in the rectal area. He has noticed some blood. Increases whenever he has a bowel movement. He denies any vomiting or diarrhea.   Today however family noticed that is also having some discomfort in his lower abdomen and feels to have difficulty when he is trying to urinate. Whenever he comes to urinate he seems to shake.  Past Medical History  Diagnosis Date  . Stroke   . Hypertension   . Hyperlipidemia   . Aortic stenosis   . CHF (congestive heart failure)   . MGUS (monoclonal gammopathy of unknown significance)   . Goiter   . Rhabdomyolysis   . UTI (urinary tract infection) 05/09/2014   Past Surgical History  Procedure Laterality Date  . Circumcision     Family History  Problem Relation Age of Onset  . Family history unknown: Yes   History  Substance Use Topics  . Smoking status: Never Smoker   . Smokeless tobacco: Never Used  . Alcohol Use: No    Review of Systems  All other systems reviewed and are negative.     Allergies  Review of patient's allergies indicates no known allergies.  Home Medications   Prior to Admission medications   Medication Sig Start Date End Date Taking? Authorizing Provider  amLODipine (NORVASC) 10 MG tablet Take 10 mg by mouth daily.   Yes Historical Provider, MD  aspirin 81 MG tablet Take 81 mg by mouth daily.   Yes Historical Provider, MD  atorvastatin (LIPITOR) 10 MG tablet Take 10 mg by mouth daily.   Yes Historical Provider, MD  cloNIDine (CATAPRES - DOSED IN MG/24 HR) 0.3 mg/24hr Place 1 patch onto the  skin once a week. Sundays   Yes Historical Provider, MD  furosemide (LASIX) 40 MG tablet Take 40 mg by mouth daily. 04/28/15  Yes Historical Provider, MD  hydrALAZINE (APRESOLINE) 100 MG tablet Take 100 mg by mouth 2 (two) times daily. 06/14/15  Yes Historical Provider, MD  labetalol (NORMODYNE) 300 MG tablet Take 1 tablet (300 mg total) by mouth 2 (two) times daily. 05/09/14  Yes [name] [place], PA-C  Tamsulosin HCl (FLOMAX) 0.4 MG CAPS Take 0.4 mg by mouth 2 (two) times daily.    Yes Historical Provider, MD  albuterol (PROVENTIL) (2.5 MG/3ML) 0.083% nebulizer solution Take 3 mLs (2.5 mg total) by nebulization every 4 (four) hours as needed for wheezing or shortness of breath. Patient not taking: Reported on 06/19/2015 04/02/15   Jonetta Osgood, MD  cephALEXin (KEFLEX) 500 MG capsule Take 500 mg by mouth 3 (three) times daily. 05/21/15   Historical Provider, MD  docusate sodium (COLACE) 100 MG capsule Take 1 capsule (100 mg total) by mouth every 12 (twelve) hours. 06/19/15   Dorie Rank, MD  ipratropium-albuterol (DUONEB) 0.5-2.5 (3) MG/3ML SOLN Take 3 mLs by nebulization 2 (two) times daily. Patient not taking: Reported on 06/19/2015 04/05/15   Jonetta Osgood, MD  potassium chloride SA (K-DUR,KLOR-CON) 20 MEQ tablet Take 1 tablet (20 mEq total) by mouth  daily. Patient not taking: Reported on 06/19/2015 04/05/15   Jonetta Osgood, MD  pramoxine (PROCTOFOAM) 1 % foam Place 1 application rectally 3 (three) times daily as needed for itching. 06/19/15   Dorie Rank, MD   BP 129/79 mmHg  Pulse 97  Temp(Src) 98.8 F (37.1 C) (Oral)  Resp 18  SpO2 98% Physical Exam  Constitutional: No distress.  HENT:  Head: Normocephalic and atraumatic.  Right Ear: External ear normal.  Left Ear: External ear normal.  Eyes: Conjunctivae are normal. Right eye exhibits no discharge. Left eye exhibits no discharge. No scleral icterus.  Neck: Neck supple. No tracheal deviation present.  Cardiovascular: Normal rate, regular  rhythm and intact distal pulses.   Pulmonary/Chest: Effort normal and breath sounds normal. No stridor. No respiratory distress. He has no wheezes. He has no rales.  Abdominal: Soft. Bowel sounds are normal. He exhibits no distension. There is tenderness in the suprapubic area. There is no rebound and no guarding.  Genitourinary:  External hemorrhoid with tenderness and a small amount of blood noted around the hemorrhoid, no thrombosis appreciated  Musculoskeletal: He exhibits no edema or tenderness.  Neurological: He is alert. He has normal strength. No cranial nerve deficit (no facial droop, extraocular movements intact, no slurred speech) or sensory deficit. He exhibits normal muscle tone. He displays no seizure activity. Coordination normal.  Skin: Skin is warm and dry. No rash noted. He is not diaphoretic.  Psychiatric: He has a normal mood and affect.  Nursing note and vitals reviewed.   ED Course  Procedures (including critical care time) Labs Review Labs Reviewed  CBC WITH DIFFERENTIAL/PLATELET - Abnormal; Notable for the following:    RBC 4.03 (*)    Hemoglobin 10.1 (*)    HCT 32.4 (*)    MCH 25.1 (*)    RDW 15.6 (*)    Neutrophils Relative % 83 (*)    Neutro Abs 8.4 (*)    Lymphocytes Relative 8 (*)    All other components within normal limits  COMPREHENSIVE METABOLIC PANEL - Abnormal; Notable for the following:    Glucose, Bld 139 (*)    BUN 24 (*)    Creatinine, Ser 1.39 (*)    Albumin 3.3 (*)    GFR calc non Af Amer 49 (*)    GFR calc Af Amer 56 (*)    All other components within normal limits  LIPASE, BLOOD - Abnormal; Notable for the following:    Lipase 12 (*)    All other components within normal limits  URINALYSIS, ROUTINE W REFLEX MICROSCOPIC (NOT AT [facility]) - Abnormal; Notable for the following:    Color, Urine RED (*)    APPearance CLOUDY (*)    Hgb urine dipstick MODERATE (*)    Protein, ur 100 (*)    Leukocytes, UA MODERATE (*)    All other components  within normal limits  URINE CULTURE  URINE MICROSCOPIC-ADD ON    Medications  0.9 %  sodium chloride infusion (1,000 mLs Intravenous New Bag/Given 06/19/15 1856)    Followed by  0.9 %  sodium chloride infusion (not administered)  ondansetron (ZOFRAN) injection 4 mg (4 mg Intravenous Given 06/19/15 1907)  morphine 4 MG/ML injection 4 mg (4 mg Intravenous Given 06/19/15 1907)     MDM   Final diagnoses:  Urinary retention  Hematuria  Hemorrhoids, unspecified hemorrhoid type    Patient had a Foley catheter placed and now has greater than 1000 mL of bloody urine. The patient feels  much better after the catheter placement. Symptoms are related to urinary retention. We will irrigate the catheter and discharge him home with a leg bag. Have him follow-up with urology to have the foley cath removed and discuss whether any further evaluation is necessary regarding the hematuria.  Will give [facility] [place] [facility] referral regarding hemorrhoids. Colace and proctofoam    Dorie Rank, MD 06/19/15 2036

## 2015-06-19 NOTE — ED Notes (Signed)
Irrigated pt's foley w/ sterile water, flushed w/ 30-63ml easily multiple times, blood clots noted w/ irrigation. Dr. Tomi Bamberger, [name] made aware.

## 2015-06-19 NOTE — Telephone Encounter (Signed)
Patient with severe AS  Not surgical candidate Should have home health.   NOt sure what needs to be done to continue

## 2015-06-19 NOTE — Telephone Encounter (Signed)
New message      Talk to the nurse regarding getting a home health nurse t come see him at home

## 2015-06-20 NOTE — Telephone Encounter (Signed)
Left message for daughter Lurena Joiner to call back. Order placed for home care. [facility] and was informed they have followed patient in the past. Staff message sent to [facility].

## 2015-06-21 NOTE — Telephone Encounter (Signed)
Spoke with patient's daughter, they have most recently used [facility].  The [facility] nurses saw him prior to that and per staff message from Gilda Crease they do not have nursing available.

## 2015-06-21 NOTE — Telephone Encounter (Signed)
Follow up      Daughter is returning Bradley [name]'s call

## 2015-06-22 LAB — URINE CULTURE: Culture: >=100000

## 2015-06-22 NOTE — Telephone Encounter (Signed)
Intake referral place in nurse fax bin to [facility] for home care nurse.  Fax # [phone]. Intake phone # is [phone]

## 2015-06-23 ENCOUNTER — Telehealth: Payer: Self-pay | Admitting: Emergency Medicine

## 2015-06-23 NOTE — Telephone Encounter (Signed)
Post ED Visit - Positive Culture Follow-up: Successful Patient Follow-Up  Culture assessed and recommendations reviewed by: []  Heide Guile, Pharm.D., BCPS-AQ ID []  Alycia Rossetti, Pharm.D., BCPS []  [place], Pharm.D., BCPS, AAHIVP []  Legrand Como, Pharm.D., BCPS, AAHIVP []  Tegan Magsam, Pharm.D. []  [place], [place], [place].D.  Positive Urine culture  [x]  Patient discharged without antimicrobial prescription and treatment is now indicated []  Organism is resistant to prescribed ED discharge antimicrobial []  Patient with positive blood cultures  Changes discussed with ED provider: Irena Cords, PA New antibiotic prescription: Bactrim DS one tab PO BID x seven days Called to [facility] [phone]  Contacted patient, date 06/23/15, time 1300   Ernesta Amble 06/23/2015, 1:15 PM

## 2015-06-23 NOTE — Progress Notes (Cosign Needed)
ED Antimicrobial Stewardship Positive Culture Follow Up   Bradley [name] is an 74 y.o. male who presented to [facility] on 06/19/2015 with a chief complaint of  Chief Complaint  Patient presents with  . Rectal Pain  . Groin Pain    Recent Results (from the past 720 hour(s))  Urine culture     Status: None   Collection Time: 06/19/15  6:40 PM  Result Value Ref Range Status   Specimen Description URINE, CATHETERIZED  Final   Special Requests NONE  Final   Culture   Final    >=100,000 COLONIES/mL ESCHERICHIA COLI Performed at [facility]    Report Status 06/22/2015 FINAL  Final   Organism ID, Bacteria ESCHERICHIA COLI  Final      Susceptibility   Escherichia coli - MIC*    AMPICILLIN >=32 RESISTANT Resistant     CEFAZOLIN >=64 RESISTANT Resistant     CEFTRIAXONE <=1 SENSITIVE Sensitive     CIPROFLOXACIN <=0.25 SENSITIVE Sensitive     GENTAMICIN <=1 SENSITIVE Sensitive     IMIPENEM <=0.25 SENSITIVE Sensitive     NITROFURANTOIN <=16 SENSITIVE Sensitive     TRIMETH/SULFA <=20 SENSITIVE Sensitive     AMPICILLIN/SULBACTAM >=32 RESISTANT Resistant     PIP/TAZO <=4 SENSITIVE Sensitive     * >=100,000 COLONIES/mL ESCHERICHIA COLI     [x]  Patient discharged originally without antimicrobial agent and treatment is now indicated  New antibiotic prescription: Bactrim 1 DS tablet PO BID x 7 days  ED Provider: Irena Cords PA-C   [name] 06/23/2015, 8:52 AM Infectious Diseases Pharmacist Phone# [phone]

## 2015-09-04 ENCOUNTER — Encounter (HOSPITAL_COMMUNITY): Payer: Self-pay | Admitting: Emergency Medicine

## 2015-09-04 ENCOUNTER — Emergency Department (HOSPITAL_COMMUNITY)
Admission: EM | Admit: 2015-09-04 | Discharge: 2015-09-04 | Disposition: A | Discharge status: Home / Self Care | Payer: Medicare Other | Attending: Emergency Medicine | Admitting: Emergency Medicine

## 2015-09-04 DIAGNOSIS — Y846 Urinary catheterization as the cause of abnormal reaction of the patient, or of later complication, without mention of misadventure at the time of the procedure: Secondary | ICD-10-CM | POA: Insufficient documentation

## 2015-09-04 DIAGNOSIS — Z792 Long term (current) use of antibiotics: Secondary | ICD-10-CM | POA: Diagnosis not present

## 2015-09-04 DIAGNOSIS — Z87440 Personal history of urinary (tract) infections: Secondary | ICD-10-CM | POA: Diagnosis not present

## 2015-09-04 DIAGNOSIS — Z862 Personal history of diseases of the blood and blood-forming organs and certain disorders involving the immune mechanism: Secondary | ICD-10-CM | POA: Diagnosis not present

## 2015-09-04 DIAGNOSIS — E785 Hyperlipidemia, unspecified: Secondary | ICD-10-CM | POA: Insufficient documentation

## 2015-09-04 DIAGNOSIS — N4889 Other specified disorders of penis: Secondary | ICD-10-CM | POA: Diagnosis present

## 2015-09-04 DIAGNOSIS — Z8673 Personal history of transient ischemic attack (TIA), and cerebral infarction without residual deficits: Secondary | ICD-10-CM | POA: Diagnosis not present

## 2015-09-04 DIAGNOSIS — T83098A Other mechanical complication of other indwelling urethral catheter, initial encounter: Secondary | ICD-10-CM | POA: Insufficient documentation

## 2015-09-04 DIAGNOSIS — I509 Heart failure, unspecified: Secondary | ICD-10-CM | POA: Insufficient documentation

## 2015-09-04 DIAGNOSIS — Z8739 Personal history of other diseases of the musculoskeletal system and connective tissue: Secondary | ICD-10-CM | POA: Diagnosis not present

## 2015-09-04 DIAGNOSIS — Z7982 Long term (current) use of aspirin: Secondary | ICD-10-CM | POA: Insufficient documentation

## 2015-09-04 DIAGNOSIS — Z8639 Personal history of other endocrine, nutritional and metabolic disease: Secondary | ICD-10-CM | POA: Insufficient documentation

## 2015-09-04 DIAGNOSIS — Z79899 Other long term (current) drug therapy: Secondary | ICD-10-CM | POA: Insufficient documentation

## 2015-09-04 DIAGNOSIS — I10 Essential (primary) hypertension: Secondary | ICD-10-CM | POA: Diagnosis not present

## 2015-09-04 DIAGNOSIS — T839XXA Unspecified complication of genitourinary prosthetic device, implant and graft, initial encounter: Secondary | ICD-10-CM

## 2015-09-04 NOTE — ED Notes (Signed)
Pt is at baseline. Daughter is at bedside. Questions, concerns denied r/t dc. Wheelchair used for transport

## 2015-09-04 NOTE — ED Notes (Signed)
Pt's family states that they were changing his clothes and they think they may have dislodged his catheter. Cath is indwelling all the time and was recently placed on Thursday.

## 2015-09-04 NOTE — ED Provider Notes (Signed)
CSN: [id number]     Arrival date & time 09/04/15  1951 History   First MD Initiated Contact with Patient 09/04/15 2113     Chief Complaint  Patient presents with  . Catheter Issues      (Consider location/radiation/quality/duration/timing/severity/associated sxs/prior Treatment) Patient is a 74 y.o. male presenting with male genitourinary complaint. The history is provided by the patient.  Male GU Problem Presenting symptoms: penile pain   Context: after injury   Relieved by:  Nothing Worsened by:  Nothing tried Ineffective treatments:  None tried Associated symptoms: no abdominal pain, no diarrhea, no fever and no vomiting   Risk factors: urinary catheter   Risk factors comment:  Pulled foley  74 yo M with a chief complaint of penile pain. Yesterday when they are taking his pants off he felt a tug his catheter. Patient was complaining of pain to the site. Family decided to come today because of the pain feeling like he may have dislodged catheter. Denies any fevers or chills. Denies any hematuria.  Past Medical History  Diagnosis Date  . Stroke   . Hypertension   . Hyperlipidemia   . Aortic stenosis   . CHF (congestive heart failure)   . MGUS (monoclonal gammopathy of unknown significance)   . Goiter   . Rhabdomyolysis   . UTI (urinary tract infection) 05/09/2014   Past Surgical History  Procedure Laterality Date  . Circumcision     Family History  Problem Relation Age of Onset  . Family history unknown: Yes   Social History  Substance Use Topics  . Smoking status: Never Smoker   . Smokeless tobacco: Never Used  . Alcohol Use: No    Review of Systems  Constitutional: Negative for fever and chills.  HENT: Negative for congestion and facial swelling.   Eyes: Negative for discharge and visual disturbance.  Respiratory: Negative for shortness of breath.   Cardiovascular: Negative for chest pain and palpitations.  Gastrointestinal: Negative for vomiting, abdominal  pain and diarrhea.  Genitourinary: Positive for penile pain.  Musculoskeletal: Negative for myalgias and arthralgias.  Skin: Negative for color change and rash.  Neurological: Negative for tremors, syncope and headaches.  Psychiatric/Behavioral: Negative for confusion and dysphoric mood.      Allergies  Review of patient's allergies indicates no known allergies.  Home Medications   Prior to Admission medications   Medication Sig Start Date End Date Taking? Authorizing Provider  albuterol (PROVENTIL) (2.5 MG/3ML) 0.083% nebulizer solution Take 3 mLs (2.5 mg total) by nebulization every 4 (four) hours as needed for wheezing or shortness of breath. Patient not taking: Reported on 06/19/2015 04/02/15   Jonetta Osgood, MD  amLODipine (NORVASC) 10 MG tablet Take 10 mg by mouth daily.    Historical Provider, MD  aspirin 81 MG tablet Take 81 mg by mouth daily.    Historical Provider, MD  atorvastatin (LIPITOR) 10 MG tablet Take 10 mg by mouth daily.    Historical Provider, MD  cephALEXin (KEFLEX) 500 MG capsule Take 500 mg by mouth 3 (three) times daily. 05/21/15   Historical Provider, MD  cloNIDine (CATAPRES - DOSED IN MG/24 HR) 0.3 mg/24hr Place 1 patch onto the skin once a week. Sundays    Historical Provider, MD  docusate sodium (COLACE) 100 MG capsule Take 1 capsule (100 mg total) by mouth every 12 (twelve) hours. 06/19/15   Dorie Rank, MD  furosemide (LASIX) 40 MG tablet Take 40 mg by mouth daily. 04/28/15   Historical Provider, MD  hydrALAZINE (APRESOLINE) 100 MG tablet Take 100 mg by mouth 2 (two) times daily. 06/14/15   Historical Provider, MD  ipratropium-albuterol (DUONEB) 0.5-2.5 (3) MG/3ML SOLN Take 3 mLs by nebulization 2 (two) times daily. Patient not taking: Reported on 06/19/2015 04/05/15   Jonetta Osgood, MD  labetalol (NORMODYNE) 300 MG tablet Take 1 tablet (300 mg total) by mouth 2 (two) times daily. 05/09/14   [name] York, PA-C  potassium chloride SA (K-DUR,KLOR-CON) 20 MEQ  tablet Take 1 tablet (20 mEq total) by mouth daily. Patient not taking: Reported on 06/19/2015 04/05/15   Jonetta Osgood, MD  pramoxine (PROCTOFOAM) 1 % foam Place 1 application rectally 3 (three) times daily as needed for itching. 06/19/15   Dorie Rank, MD  Tamsulosin HCl (FLOMAX) 0.4 MG CAPS Take 0.4 mg by mouth 2 (two) times daily.     Historical Provider, MD   BP 124/63 mmHg  Pulse 75  Temp(Src) 98.8 F (37.1 C) (Oral)  Resp 18  SpO2 98% Physical Exam  Constitutional: He is oriented to person, place, and time. He appears well-developed and well-nourished.  HENT:  Head: Normocephalic and atraumatic.  Eyes: EOM are normal. Pupils are equal, round, and reactive to light.  Neck: Normal range of motion. Neck supple. No JVD present.  Cardiovascular: Normal rate and regular rhythm.  Exam reveals no gallop and no friction rub.   No murmur heard. Pulmonary/Chest: No respiratory distress. He has no wheezes.  Abdominal: He exhibits no distension. There is no tenderness. There is no rebound and no guarding.  Genitourinary: Penis normal. No penile tenderness.  Foley catheter in place. Draining well. No hematuria or clots.  Musculoskeletal: Normal range of motion.  Neurological: He is alert and oriented to person, place, and time.  Skin: No rash noted. No pallor.  Psychiatric: He has a normal mood and affect. His behavior is normal.    ED Course  Procedures (including critical care time) Labs Review Labs Reviewed - No data to display  Imaging Review No results found. I have personally reviewed and evaluated these images and lab results as part of my medical decision-making.   EKG Interpretation None      MDM   Final diagnoses:  Urinary catheter complication, initial encounter    74 yo M with a chief complaint of catheter pain. Catheter in place and draining into the bag. No signs of hematuria. Patient otherwise asymptomatic. No suprapubic tenderness. We'll have the patient take  analgesics home follow-up with his PCP.   I have discussed the diagnosis/risks/treatment options with the patient and family and believe the pt to be eligible for discharge home to follow-up with PCP, urology. We also discussed returning to the ED immediately if new or worsening sx occur. We discussed the sx which are most concerning (e.g., sudden worsening pain) that necessitate immediate return. Medications administered to the patient during their visit and any new prescriptions provided to the patient are listed below.  Medications given during this visit Medications - No data to display  Discharge Medication List as of 09/04/2015  9:27 PM       The patient appears reasonably screen and/or stabilized for discharge and I doubt any other medical condition or other [facility] requiring further screening, evaluation, or treatment in the ED at this time prior to discharge.    Deno Etienne, DO 09/05/15 2637

## 2015-09-10 ENCOUNTER — Encounter (HOSPITAL_COMMUNITY): Payer: Self-pay | Admitting: Emergency Medicine

## 2015-09-10 ENCOUNTER — Emergency Department (HOSPITAL_COMMUNITY)
Admission: EM | Admit: 2015-09-10 | Discharge: 2015-09-11 | Disposition: A | Discharge status: Home / Self Care | Payer: Medicare Other | Attending: Emergency Medicine | Admitting: Emergency Medicine

## 2015-09-10 DIAGNOSIS — Z79899 Other long term (current) drug therapy: Secondary | ICD-10-CM | POA: Diagnosis not present

## 2015-09-10 DIAGNOSIS — Z862 Personal history of diseases of the blood and blood-forming organs and certain disorders involving the immune mechanism: Secondary | ICD-10-CM | POA: Diagnosis not present

## 2015-09-10 DIAGNOSIS — Z87440 Personal history of urinary (tract) infections: Secondary | ICD-10-CM | POA: Diagnosis not present

## 2015-09-10 DIAGNOSIS — Z8673 Personal history of transient ischemic attack (TIA), and cerebral infarction without residual deficits: Secondary | ICD-10-CM | POA: Insufficient documentation

## 2015-09-10 DIAGNOSIS — F0391 Unspecified dementia with behavioral disturbance: Secondary | ICD-10-CM | POA: Diagnosis not present

## 2015-09-10 DIAGNOSIS — I509 Heart failure, unspecified: Secondary | ICD-10-CM | POA: Diagnosis not present

## 2015-09-10 DIAGNOSIS — Z7982 Long term (current) use of aspirin: Secondary | ICD-10-CM | POA: Insufficient documentation

## 2015-09-10 DIAGNOSIS — I10 Essential (primary) hypertension: Secondary | ICD-10-CM | POA: Diagnosis not present

## 2015-09-10 DIAGNOSIS — E785 Hyperlipidemia, unspecified: Secondary | ICD-10-CM | POA: Diagnosis not present

## 2015-09-10 DIAGNOSIS — F911 Conduct disorder, childhood-onset type: Secondary | ICD-10-CM | POA: Diagnosis present

## 2015-09-10 HISTORY — DX: Unspecified dementia, unspecified severity, without behavioral disturbance, psychotic disturbance, mood disturbance, and anxiety: F03.90

## 2015-09-10 LAB — URINE MICROSCOPIC-ADD ON

## 2015-09-10 LAB — COMPREHENSIVE METABOLIC PANEL
ALT: 30 U/L (ref 17–63)
AST: 31 U/L (ref 15–41)
Albumin: 3.6 g/dL (ref 3.5–5.0)
Alkaline Phosphatase: 73 U/L (ref 38–126)
Anion gap: 8 (ref 5–15)
BILIRUBIN TOTAL: 0.7 mg/dL (ref 0.3–1.2)
BUN: 16 mg/dL (ref 6–20)
CHLORIDE: 103 mmol/L (ref 101–111)
CO2: 27 mmol/L (ref 22–32)
Calcium: 9.9 mg/dL (ref 8.9–10.3)
Creatinine, Ser: 1.08 mg/dL (ref 0.61–1.24)
GFR calc non Af Amer: >60 mL/min (ref >60)
Glucose, Bld: 101 mg/dL — ABNORMAL HIGH (ref 65–99)
Potassium: 4.6 mmol/L (ref 3.5–5.1)
Sodium: 138 mmol/L (ref 135–145)
Total Protein: 7.7 g/dL (ref 6.5–8.1)

## 2015-09-10 LAB — URINALYSIS, ROUTINE W REFLEX MICROSCOPIC
Bilirubin Urine: NEGATIVE
Glucose, UA: NEGATIVE mg/dL
KETONES UR: NEGATIVE mg/dL
Nitrite: NEGATIVE
Specific Gravity, Urine: 1.016 (ref 1.005–1.030)
Urobilinogen, UA: 0.2 mg/dL (ref 0.0–1.0)
pH: 7 (ref 5.0–8.0)

## 2015-09-10 LAB — CBC WITH DIFFERENTIAL/PLATELET
BASOS PCT: 1 % (ref 0–1)
Basophils Absolute: 0 10*3/uL (ref 0.0–0.1)
EOS ABS: 0 10*3/uL (ref 0.0–0.7)
EOS PCT: 1 % (ref 0–5)
HCT: 29.8 % — ABNORMAL LOW (ref 39.0–52.0)
HEMOGLOBIN: 9.6 g/dL — AB (ref 13.0–17.0)
Lymphocytes Relative: 34 % (ref 12–46)
Lymphs Abs: 1.4 10*3/uL (ref 0.7–4.0)
MCH: 27.1 pg (ref 26.0–34.0)
MCHC: 32.2 g/dL (ref 30.0–36.0)
MCV: 84.2 fL (ref 78.0–100.0)
Monocytes Absolute: 0.4 10*3/uL (ref 0.1–1.0)
Monocytes Relative: 10 % (ref 3–12)
Neutro Abs: 2.2 10*3/uL (ref 1.7–7.7)
Neutrophils Relative %: 54 % (ref 43–77)
PLATELETS: 223 10*3/uL (ref 150–400)
RBC: 3.54 MIL/uL — AB (ref 4.22–5.81)
RDW: 14.3 % (ref 11.5–15.5)
WBC: 4 10*3/uL (ref 4.0–10.5)

## 2015-09-10 NOTE — ED Provider Notes (Signed)
CSN: [id number]     Arrival date & time 09/10/15  1809 History   First MD Initiated Contact with Patient 09/10/15 2132     Chief Complaint  Patient presents with  . Dementia  . Aggressive Behavior     HPI  Bradley [name] is a 74 y.o. male with a PMH of stroke, HTN, HLD who presents to the ED with aggressive behavior. His daughter states he was "irate" today at home and was cursing at family members. She states this is different from his behavior at baseline. She reports she tried to calm him down, but was unable to. Patient has no complaints in the ED. He denies fever, chills, lightheadedness, dizziness, syncope, chest pain, shortness of breath, abdominal pain, N/V/D/C, dysuria, urgency, frequency, weakness, numbness, paresthesia.   Past Medical History  Diagnosis Date  . Stroke   . Hypertension   . Hyperlipidemia   . Aortic stenosis   . CHF (congestive heart failure)   . MGUS (monoclonal gammopathy of unknown significance)   . Goiter   . Rhabdomyolysis   . UTI (urinary tract infection) 05/09/2014  . Dementia    Past Surgical History  Procedure Laterality Date  . Circumcision     Family History  Problem Relation Age of Onset  . Family history unknown: Yes   Social History  Substance Use Topics  . Smoking status: Never Smoker   . Smokeless tobacco: Never Used  . Alcohol Use: No     Review of Systems  Constitutional: Negative for fever, chills, activity change, appetite change and fatigue.  HENT: Negative for congestion.   Respiratory: Negative for cough and shortness of breath.   Cardiovascular: Negative for chest pain, palpitations and leg swelling.  Gastrointestinal: Negative for nausea, vomiting, abdominal pain, diarrhea, constipation and abdominal distention.  Genitourinary: Negative for dysuria, urgency and frequency.  Musculoskeletal: Negative for myalgias, back pain, arthralgias, neck pain and neck stiffness.  Skin: Negative for color change, pallor, rash and  wound.  Neurological: Negative for dizziness, syncope, weakness, light-headedness, numbness and headaches.  All other systems reviewed and are negative.     Allergies  Review of patient's allergies indicates no known allergies.  Home Medications   Prior to Admission medications   Medication Sig Start Date End Date Taking? Authorizing Provider  amLODipine (NORVASC) 10 MG tablet Take 10 mg by mouth daily.   Yes Historical Provider, MD  aspirin 81 MG tablet Take 81 mg by mouth daily.   Yes Historical Provider, MD  atorvastatin (LIPITOR) 10 MG tablet Take 10 mg by mouth daily.   Yes Historical Provider, MD  furosemide (LASIX) 40 MG tablet Take 40 mg by mouth daily. 04/28/15  Yes Historical Provider, MD  hydrALAZINE (APRESOLINE) 100 MG tablet Take 50-100 mg by mouth 2 (two) times daily. Take 100 mg every morning and 50 mg every night. 06/14/15  Yes Historical Provider, MD  labetalol (NORMODYNE) 300 MG tablet Take 1 tablet (300 mg total) by mouth 2 (two) times daily. 05/09/14  Yes Marianne L York, PA-C  albuterol (PROVENTIL) (2.5 MG/3ML) 0.083% nebulizer solution Take 3 mLs (2.5 mg total) by nebulization every 4 (four) hours as needed for wheezing or shortness of breath. Patient not taking: Reported on 06/19/2015 04/02/15   Jonetta Osgood, MD  docusate sodium (COLACE) 100 MG capsule Take 1 capsule (100 mg total) by mouth every 12 (twelve) hours. Patient not taking: Reported on 09/10/2015 06/19/15   Dorie Rank, MD  ipratropium-albuterol (DUONEB) 0.5-2.5 (3) MG/3ML SOLN  Take 3 mLs by nebulization 2 (two) times daily. Patient not taking: Reported on 06/19/2015 04/05/15   Jonetta Osgood, MD  levofloxacin (LEVAQUIN) 500 MG tablet Take 1 tablet by mouth daily. Starting 09/05/15 for 7 days. 09/05/15   Historical Provider, MD  potassium chloride SA (K-DUR,KLOR-CON) 20 MEQ tablet Take 1 tablet (20 mEq total) by mouth daily. Patient not taking: Reported on 06/19/2015 04/05/15   Jonetta Osgood, MD  pramoxine  (PROCTOFOAM) 1 % foam Place 1 application rectally 3 (three) times daily as needed for itching. Patient not taking: Reported on 09/10/2015 06/19/15   Dorie Rank, MD    BP 133/69 mmHg  Pulse 68  Temp(Src) 98 F (36.7 C) (Oral)  Resp 19  SpO2 100% Physical Exam  Constitutional: He appears well-developed and well-nourished. No distress.  Chronically ill appearing male in no acute distress.  HENT:  Head: Normocephalic and atraumatic.  Right Ear: External ear normal.  Left Ear: External ear normal.  Nose: Nose normal.  Mouth/Throat: Uvula is midline, oropharynx is clear and moist and mucous membranes are normal.  Eyes: Conjunctivae, EOM and lids are normal. Pupils are equal, round, and reactive to light. Right eye exhibits no discharge. Left eye exhibits no discharge. No scleral icterus.  Neck: Normal range of motion. Neck supple.  Cardiovascular: Normal rate, regular rhythm, intact distal pulses and normal pulses.   3/6 systolic murmur, loudest over RUSB.  Pulmonary/Chest: Effort normal and breath sounds normal. No respiratory distress. He has no wheezes. He has no rales.  Abdominal: Soft. Normal appearance and bowel sounds are normal. He exhibits no distension and no mass. There is no tenderness. There is no rigidity, no rebound and no guarding.  Musculoskeletal: Normal range of motion. He exhibits no edema or tenderness.  Neurological: He is alert. He has normal strength. No cranial nerve deficit or sensory deficit.  Oriented to person and place.  Skin: Skin is warm, dry and intact. No rash noted. He is not diaphoretic. No erythema. No pallor.  Psychiatric: He has a normal mood and affect. His speech is normal and behavior is normal. Judgment and thought content normal.  Nursing note and vitals reviewed.   ED Course  Procedures (including critical care time)  Labs Review Labs Reviewed  COMPREHENSIVE METABOLIC PANEL - Abnormal; Notable for the following:    Glucose, Bld 101 (*)     All other components within normal limits  URINALYSIS, ROUTINE W REFLEX MICROSCOPIC (NOT AT [facility]) - Abnormal; Notable for the following:    Hgb urine dipstick SMALL (*)    Protein, ur >300 (*)    Leukocytes, UA TRACE (*)    All other components within normal limits  CBC WITH DIFFERENTIAL/PLATELET - Abnormal; Notable for the following:    RBC 3.54 (*)    Hemoglobin 9.6 (*)    HCT 29.8 (*)    All other components within normal limits  URINE CULTURE  URINE MICROSCOPIC-ADD ON  CBC WITH DIFFERENTIAL/PLATELET    Imaging Review No results found.   I have personally reviewed and evaluated these images and lab results as part of my medical decision-making.   EKG Interpretation None      MDM   Final diagnoses:  Dementia, with behavioral disturbance    74 year old male presents with aggressive behavior, which his daughter states began today and is different from his baseline behavior. The patient has no complaints in the ED.  Patient is afebrile. Vital signs stable. Patient is alert and oriented  to person and place, which his daughter reports is normal for him given his history of dementia. No aggressive behavior noted in the ED. Mucous membranes moist. Lungs clear to auscultation bilaterally. Abdomen soft, non-tender, non-distended. Normal neuro exam with no focal neuro deficit.  CBC remarkable for anemia with hemoglobin 9.6, which appears chronic and stable. CMP within normal limits, UA with trace leukocytes and small hemoglobin, 0-2 WBC on microscopic. Patient's daughter reports the patient is currently on levofloxacin for UTI treatment. Will culture urine.  Patient discussed with and seen by Dr. Tyrone Nine. At this time, feel patient is stable for discharge. Patient to follow-up with PCP. Return precautions discussed.  BP 133/69 mmHg  Pulse 68  Temp(Src) 98 F (36.7 C) (Oral)  Resp 19  SpO2 100%      Marella Chimes, PA-C 09/11/15 [place], DO 09/11/15 [phone]

## 2015-09-10 NOTE — Discharge Instructions (Signed)
1. Medications: usual home medications 2. Treatment: rest, drink plenty of fluids  3. Follow Up: please followup with your primary doctor for discussion of your diagnoses and further evaluation after today's visit; please return to the ER for fever, confusion, loss of consciousness, chest pain, shortness of breath, severe abdominal pain, persistent vomiting, new or worsening symptoms   Dementia Dementia is a word that is used to describe problems with the brain and how it works. People with dementia have memory loss. They may also have problems with thinking, speaking, or solving problems. It can affect how they act around people, how they do their job, their mood, and their personality. These changes may not show up for a long time. Family or friends may not notice problems in the early part of this disease. HOME CARE The following tips are for the person living with, or caring for, the person with dementia. Make the home safe.  Remove locks on bathroom doors.  Use childproof locks on cabinets where alcohol, cleaning supplies, or chemicals are stored.  Put outlet covers in electrical outlets.  Put in childproof locks to keep doors and windows safe.  Remove stove knobs, or put in safety knobs that shut off on their own.  Lower the temperature on water heaters.  Label medicines. Lock them in a safe place.  Keep knives, lighters, matches, power tools, and guns out of reach or in a safe place.  Remove objects that might break or can hurt the person.  Make sure lighting is good inside and outside.  Put in grab bars if needed.  Use a device that detects falls or other needs for help. Lessen confusion.  Keep familiar objects and people around.  Use night lights or low lit (dim) lights at night.  Label objects or areas.  Use reminders, notes, or directions for daily activities or tasks.  Keep a simple routine that is the same for waking, meals, bathing, dressing, and  bedtime.  Create a calm and quiet home.  Put up clocks and calendars.  Keep emergency numbers and the home address near all phones.  Help show the different times of day. Open the curtains during the day to let light in. Speak clearly and directly.  Choose simple words and short sentences.  Use a gentle, calm voice.  Do not interrupt.  If the person has a hard time finding a word to use, give them the word or thought.  Ask 1 question at a time. Give enough time for the person to answer. Repeat the question if the person does not answer. Do things that lessen restlessness.  Provide a comfortable bed.  Have the same bedtime routine every night.  Have a regular walking and activity schedule.  Lessen naps during the day.  Do not let the person drink a lot of caffeine.  Go to events that are not overwhelming. Eat well and drink fluids.  Lessen distractions during meal times and snacks.  Avoid foods that are too hot or too cold.  Watch how the person chews and swallows. This is to make sure they do not choke. Other  Keep all vision, hearing, dental, and medical visits with the doctor.  Only give medicines as told by the doctor.  Watch the person's driving ability. Do not let the person drive if he or she cannot drive safely.  Use a program that helps find a person if they become missing. You may need to register with this program. GET HELP RIGHT AWAY  IF:   A fever of 102 F (38.9 C) develops.  Confusion develops or gets worse.  Sleepiness develops or gets worse.  Staying awake is hard to do.  New behavior problems start like mood swings, aggression, and seeing things that are not there.  Problems with balance, speech, or falling develop.  Problems swallowing develop.  Any problems of another sickness develop. MAKE SURE YOU:  Understand these instructions.  Will watch his or her condition.  Will get help right away if he or she is not doing well or  gets worse. Document Released: 11/27/2008 Document Revised: 03/08/2012 Document Reviewed: 05/12/2011 [facility] Patient Information 2015 [place], [place]. This information is not intended to replace advice given to you by your health care provider. Make sure you discuss any questions you have with your health care provider.

## 2015-09-10 NOTE — ED Notes (Addendum)
Daughter states patient has been having increased aggressive behavior at home, cursing at family members. Pt oriented to self and place. Disoriented to date and situation. Pt denies any complaints. Patient ambulates with walker. Mechanical soft diet with thickened liquids. Daughter denies decreased PO intake, denies increased falls.

## 2015-09-11 NOTE — ED Notes (Signed)
Pt and daughter verbalized understanding of dc instructions and follow up care. No change in assessment while in ED. Signer not working. Escorted to car via wheelchair.

## 2015-09-12 LAB — URINE CULTURE: Culture: NO GROWTH

## 2015-11-02 ENCOUNTER — Encounter (HOSPITAL_COMMUNITY): Payer: Self-pay | Admitting: *Deleted

## 2015-11-02 ENCOUNTER — Other Ambulatory Visit (HOSPITAL_COMMUNITY)
Admission: RE | Admit: 2015-11-02 | Discharge: 2015-11-02 | Disposition: A | Discharge status: Home / Self Care | Payer: Medicare Other | Source: Ambulatory Visit | Attending: Family Medicine | Admitting: Family Medicine

## 2015-11-02 ENCOUNTER — Emergency Department (INDEPENDENT_AMBULATORY_CARE_PROVIDER_SITE_OTHER)
Admission: EM | Admit: 2015-11-02 | Discharge: 2015-11-02 | Disposition: A | Discharge status: Home / Self Care | Payer: Medicare Other | Source: Home / Self Care | Attending: Family Medicine | Admitting: Family Medicine

## 2015-11-02 DIAGNOSIS — N390 Urinary tract infection, site not specified: Secondary | ICD-10-CM

## 2015-11-02 LAB — POCT I-STAT, CHEM 8
BUN: 22 mg/dL — ABNORMAL HIGH (ref 6–20)
CALCIUM ION: 1.21 mmol/L (ref 1.13–1.30)
CHLORIDE: 113 mmol/L — AB (ref 101–111)
Creatinine, Ser: 1.2 mg/dL (ref 0.61–1.24)
Glucose, Bld: 109 mg/dL — ABNORMAL HIGH (ref 65–99)
HEMATOCRIT: 33 % — AB (ref 39.0–52.0)
Hemoglobin: 11.2 g/dL — ABNORMAL LOW (ref 13.0–17.0)
Potassium: 4.2 mmol/L (ref 3.5–5.1)
SODIUM: 144 mmol/L (ref 135–145)
TCO2: 23 mmol/L (ref 0–100)

## 2015-11-02 MED ORDER — CEFTRIAXONE SODIUM 1 G IJ SOLR
1.0000 g | Freq: Once | INTRAMUSCULAR | Status: AC
  Administered 2015-11-02: 1 g via INTRAMUSCULAR

## 2015-11-02 MED ORDER — CEPHALEXIN 500 MG PO CAPS: 500.0000 mg | ORAL_CAPSULE | Freq: Four times a day (QID) | ORAL | Status: DC

## 2015-11-02 MED ORDER — CEFTRIAXONE SODIUM 1 G IJ SOLR
INTRAMUSCULAR | Status: AC
  Filled 2015-11-02: qty 10

## 2015-11-02 NOTE — ED Notes (Signed)
Pt     Reports        Symptoms      Of  Weakness  Pt  Has   indwelling  Foley  Catheter  In place           History  uti  In  Past    Family  Member  At  bedside

## 2015-11-02 NOTE — Discharge Instructions (Signed)
Give all of medicine and contact dr little to check on urine culture report on Monday, encourage more fluids, he seems to be a little dehydrated.

## 2015-11-02 NOTE — ED Provider Notes (Signed)
CSN: [id number]     Arrival date & time 11/02/15  1701 History   First MD Initiated Contact with Patient 11/02/15 1746     No chief complaint on file.  (Consider location/radiation/quality/duration/timing/severity/associated sxs/prior Treatment) Patient is a 74 y.o. male presenting with weakness. The history is provided by the patient.  Weakness This is a new problem. The current episode started 6 to 12 hours ago (seen by home health nurse 2 days ago without problems, daught says energy levle seems off which is common for him with uti, has indwelling cath, sees dr Janice Norrie.). The problem has been gradually worsening. Pertinent negatives include no chest pain and no abdominal pain.    Past Medical History  Diagnosis Date  . Stroke ([place])   . Hypertension   . Hyperlipidemia   . Aortic stenosis   . CHF (congestive heart failure) ([place])   . MGUS (monoclonal gammopathy of unknown significance)   . Goiter   . Rhabdomyolysis   . UTI (urinary tract infection) 05/09/2014  . Dementia    Past Surgical History  Procedure Laterality Date  . Circumcision     Family History  Problem Relation Age of Onset  . Family history unknown: Yes   Social History  Substance Use Topics  . Smoking status: Never Smoker   . Smokeless tobacco: Never Used  . Alcohol Use: No    Review of Systems  Constitutional: Negative for fever, chills and appetite change.  Cardiovascular: Negative for chest pain.  Gastrointestinal: Negative for nausea, vomiting and abdominal pain.  Neurological: Positive for weakness.  All other systems reviewed and are negative.   Allergies  Review of patient's allergies indicates no known allergies.  Home Medications   Prior to Admission medications   Medication Sig Start Date End Date Taking? Authorizing Provider  albuterol (PROVENTIL) (2.5 MG/3ML) 0.083% nebulizer solution Take 3 mLs (2.5 mg total) by nebulization every 4 (four) hours as needed for wheezing or shortness of  breath. Patient not taking: Reported on 06/19/2015 04/02/15   Jonetta Osgood, MD  amLODipine (NORVASC) 10 MG tablet Take 10 mg by mouth daily.    Historical Provider, MD  aspirin 81 MG tablet Take 81 mg by mouth daily.    Historical Provider, MD  atorvastatin (LIPITOR) 10 MG tablet Take 10 mg by mouth daily.    Historical Provider, MD  cephALEXin (KEFLEX) 500 MG capsule Take 1 capsule (500 mg total) by mouth 4 (four) times daily. Take all of medicine and drink lots of fluids 11/02/15   Billy Fischer, MD  docusate sodium (COLACE) 100 MG capsule Take 1 capsule (100 mg total) by mouth every 12 (twelve) hours. Patient not taking: Reported on 09/10/2015 06/19/15   Dorie Rank, MD  furosemide (LASIX) 40 MG tablet Take 40 mg by mouth daily. 04/28/15   Historical Provider, MD  hydrALAZINE (APRESOLINE) 100 MG tablet Take 50-100 mg by mouth 2 (two) times daily. Take 100 mg every morning and 50 mg every night. 06/14/15   Historical Provider, MD  ipratropium-albuterol (DUONEB) 0.5-2.5 (3) MG/3ML SOLN Take 3 mLs by nebulization 2 (two) times daily. Patient not taking: Reported on 06/19/2015 04/05/15   Jonetta Osgood, MD  labetalol (NORMODYNE) 300 MG tablet Take 1 tablet (300 mg total) by mouth 2 (two) times daily. 05/09/14   [name] York, PA-C  levofloxacin (LEVAQUIN) 500 MG tablet Take 1 tablet by mouth daily. Starting 09/05/15 for 7 days. 09/05/15   Historical Provider, MD  potassium chloride SA (K-DUR,KLOR-CON)  20 MEQ tablet Take 1 tablet (20 mEq total) by mouth daily. Patient not taking: Reported on 06/19/2015 04/05/15   Jonetta Osgood, MD  pramoxine (PROCTOFOAM) 1 % foam Place 1 application rectally 3 (three) times daily as needed for itching. Patient not taking: Reported on 09/10/2015 06/19/15   Dorie Rank, MD   Meds Ordered and Administered this Visit   Medications  cefTRIAXone (ROCEPHIN) injection 1 g (not administered)    BP 151/81 mmHg  Pulse 84  Temp(Src) 98.6 F (37 C) (Oral)  Resp 18  SpO2 95% No  data found.   Physical Exam  Constitutional: He appears well-developed and well-nourished. No distress.  Neck: Normal range of motion. Neck supple.  Cardiovascular: Normal heart sounds.   Abdominal: Soft. Bowel sounds are normal. There is no tenderness.  Neurological: He is alert.  Skin: Skin is warm and dry.  Nursing note and vitals reviewed.   ED Course  Procedures (including critical care time)  Labs Review Labs Reviewed  POCT I-STAT, CHEM 8 - Abnormal; Notable for the following:    Chloride 113 (*)    BUN 22 (*)    Glucose, Bld 109 (*)    Hemoglobin 11.2 (*)    HCT 33.0 (*)    All other components within normal limits  URINE CULTURE    Imaging Review No results found.   Visual Acuity Review  Right Eye Distance:   Left Eye Distance:   Bilateral Distance:    Right Eye Near:   Left Eye Near:    Bilateral Near:         MDM   1. UTI (lower urinary tract infection)        Billy Fischer, MD 11/02/15 [phone]

## 2015-11-04 LAB — URINE CULTURE: SPECIAL REQUESTS: NORMAL

## 2015-11-05 NOTE — ED Notes (Signed)
Discussed final urine C&S report w Dr Donley Redder Juventino Slovak; no further action required

## 2015-11-06 ENCOUNTER — Encounter: Payer: Self-pay | Admitting: Internal Medicine

## 2015-11-07 ENCOUNTER — Encounter (HOSPITAL_COMMUNITY): Payer: Self-pay | Admitting: Emergency Medicine

## 2015-11-07 ENCOUNTER — Emergency Department (HOSPITAL_COMMUNITY): Payer: Medicare Other

## 2015-11-07 ENCOUNTER — Inpatient Hospital Stay (HOSPITAL_COMMUNITY)
Admission: EM | Admit: 2015-11-07 | Discharge: 2015-11-12 | DRG: 689 | Disposition: A | Discharge status: Home Health | Payer: Medicare Other | Attending: Internal Medicine | Admitting: Internal Medicine

## 2015-11-07 DIAGNOSIS — I10 Essential (primary) hypertension: Secondary | ICD-10-CM | POA: Diagnosis not present

## 2015-11-07 DIAGNOSIS — R531 Weakness: Secondary | ICD-10-CM | POA: Diagnosis not present

## 2015-11-07 DIAGNOSIS — I519 Heart disease, unspecified: Secondary | ICD-10-CM | POA: Diagnosis not present

## 2015-11-07 DIAGNOSIS — E870 Hyperosmolality and hypernatremia: Secondary | ICD-10-CM | POA: Diagnosis present

## 2015-11-07 DIAGNOSIS — R0602 Shortness of breath: Secondary | ICD-10-CM

## 2015-11-07 DIAGNOSIS — N183 Chronic kidney disease, stage 3 unspecified: Secondary | ICD-10-CM | POA: Diagnosis present

## 2015-11-07 DIAGNOSIS — I130 Hypertensive heart and chronic kidney disease with heart failure and stage 1 through stage 4 chronic kidney disease, or unspecified chronic kidney disease: Secondary | ICD-10-CM | POA: Diagnosis present

## 2015-11-07 DIAGNOSIS — E049 Nontoxic goiter, unspecified: Secondary | ICD-10-CM | POA: Diagnosis present

## 2015-11-07 DIAGNOSIS — I350 Nonrheumatic aortic (valve) stenosis: Secondary | ICD-10-CM | POA: Diagnosis not present

## 2015-11-07 DIAGNOSIS — I272 Other secondary pulmonary hypertension: Secondary | ICD-10-CM | POA: Diagnosis present

## 2015-11-07 DIAGNOSIS — N390 Urinary tract infection, site not specified: Secondary | ICD-10-CM | POA: Diagnosis not present

## 2015-11-07 DIAGNOSIS — J9601 Acute respiratory failure with hypoxia: Secondary | ICD-10-CM | POA: Diagnosis not present

## 2015-11-07 DIAGNOSIS — Z09 Encounter for follow-up examination after completed treatment for conditions other than malignant neoplasm: Secondary | ICD-10-CM

## 2015-11-07 DIAGNOSIS — E860 Dehydration: Secondary | ICD-10-CM | POA: Diagnosis present

## 2015-11-07 DIAGNOSIS — R778 Other specified abnormalities of plasma proteins: Secondary | ICD-10-CM

## 2015-11-07 DIAGNOSIS — I5030 Unspecified diastolic (congestive) heart failure: Secondary | ICD-10-CM | POA: Diagnosis present

## 2015-11-07 DIAGNOSIS — I248 Other forms of acute ischemic heart disease: Secondary | ICD-10-CM | POA: Diagnosis present

## 2015-11-07 DIAGNOSIS — Z7982 Long term (current) use of aspirin: Secondary | ICD-10-CM

## 2015-11-07 DIAGNOSIS — J690 Pneumonitis due to inhalation of food and vomit: Secondary | ICD-10-CM | POA: Diagnosis not present

## 2015-11-07 DIAGNOSIS — Z8673 Personal history of transient ischemic attack (TIA), and cerebral infarction without residual deficits: Secondary | ICD-10-CM

## 2015-11-07 DIAGNOSIS — R7989 Other specified abnormal findings of blood chemistry: Secondary | ICD-10-CM | POA: Diagnosis not present

## 2015-11-07 DIAGNOSIS — R946 Abnormal results of thyroid function studies: Secondary | ICD-10-CM | POA: Diagnosis present

## 2015-11-07 DIAGNOSIS — D472 Monoclonal gammopathy: Secondary | ICD-10-CM | POA: Diagnosis present

## 2015-11-07 DIAGNOSIS — Z79899 Other long term (current) drug therapy: Secondary | ICD-10-CM

## 2015-11-07 DIAGNOSIS — F0391 Unspecified dementia with behavioral disturbance: Secondary | ICD-10-CM

## 2015-11-07 DIAGNOSIS — R451 Restlessness and agitation: Secondary | ICD-10-CM

## 2015-11-07 DIAGNOSIS — I69354 Hemiplegia and hemiparesis following cerebral infarction affecting left non-dominant side: Secondary | ICD-10-CM

## 2015-11-07 DIAGNOSIS — E785 Hyperlipidemia, unspecified: Secondary | ICD-10-CM | POA: Diagnosis present

## 2015-11-07 DIAGNOSIS — D649 Anemia, unspecified: Secondary | ICD-10-CM | POA: Diagnosis present

## 2015-11-07 LAB — URINALYSIS, ROUTINE W REFLEX MICROSCOPIC
Bilirubin Urine: NEGATIVE
GLUCOSE, UA: NEGATIVE mg/dL
KETONES UR: NEGATIVE mg/dL
Leukocytes, UA: NEGATIVE
NITRITE: NEGATIVE
PH: 5.5 (ref 5.0–8.0)
Protein, ur: NEGATIVE mg/dL
SPECIFIC GRAVITY, URINE: 1.009 (ref 1.005–1.030)
Urobilinogen, UA: 0.2 mg/dL (ref 0.0–1.0)

## 2015-11-07 LAB — CBC
HEMATOCRIT: 30.9 % — AB (ref 39.0–52.0)
HEMOGLOBIN: 9.9 g/dL — AB (ref 13.0–17.0)
MCH: 27.2 pg (ref 26.0–34.0)
MCHC: 32 g/dL (ref 30.0–36.0)
MCV: 84.9 fL (ref 78.0–100.0)
Platelets: 145 10*3/uL — ABNORMAL LOW (ref 150–400)
RBC: 3.64 MIL/uL — ABNORMAL LOW (ref 4.22–5.81)
RDW: 13.7 % (ref 11.5–15.5)
WBC: 3.9 10*3/uL — AB (ref 4.0–10.5)

## 2015-11-07 LAB — BASIC METABOLIC PANEL
ANION GAP: 9 (ref 5–15)
BUN: 21 mg/dL — ABNORMAL HIGH (ref 6–20)
CHLORIDE: 112 mmol/L — AB (ref 101–111)
CO2: 25 mmol/L (ref 22–32)
CREATININE: 0.85 mg/dL (ref 0.61–1.24)
Calcium: 9.9 mg/dL (ref 8.9–10.3)
GFR calc Af Amer: >60 mL/min (ref >60)
GFR calc non Af Amer: >60 mL/min (ref >60)
Glucose, Bld: 108 mg/dL — ABNORMAL HIGH (ref 65–99)
POTASSIUM: 3.6 mmol/L (ref 3.5–5.1)
Sodium: 146 mmol/L — ABNORMAL HIGH (ref 135–145)

## 2015-11-07 LAB — I-STAT CG4 LACTIC ACID, ED: Lactic Acid, Venous: 0.98 mmol/L (ref 0.5–2.0)

## 2015-11-07 LAB — URINE MICROSCOPIC-ADD ON

## 2015-11-07 LAB — TROPONIN I: Troponin I: 0.09 ng/mL — ABNORMAL HIGH (ref <0.031)

## 2015-11-07 MED ORDER — HYDRALAZINE HCL 50 MG PO TABS
100.0000 mg | ORAL_TABLET | Freq: Every day | ORAL | Status: DC
  Administered 2015-11-08 – 2015-11-12 (×4): 100 mg via ORAL
  Filled 2015-11-07 (×5): qty 2

## 2015-11-07 MED ORDER — SODIUM CHLORIDE 0.45 % IV SOLN
INTRAVENOUS | Status: DC
  Administered 2015-11-07 – 2015-11-08 (×2): via INTRAVENOUS

## 2015-11-07 MED ORDER — ATORVASTATIN CALCIUM 10 MG PO TABS
10.0000 mg | ORAL_TABLET | Freq: Every day | ORAL | Status: DC
  Administered 2015-11-07 – 2015-11-11 (×3): 10 mg via ORAL
  Filled 2015-11-07 (×5): qty 1

## 2015-11-07 MED ORDER — HYDRALAZINE HCL 50 MG PO TABS
50.0000 mg | ORAL_TABLET | Freq: Every day | ORAL | Status: DC
  Administered 2015-11-07 – 2015-11-11 (×4): 50 mg via ORAL
  Filled 2015-11-07 (×5): qty 1

## 2015-11-07 MED ORDER — IPRATROPIUM-ALBUTEROL 0.5-2.5 (3) MG/3ML IN SOLN
3.0000 mL | Freq: Four times a day (QID) | RESPIRATORY_TRACT | Status: DC | PRN
  Administered 2015-11-08 – 2015-11-09 (×3): 3 mL via RESPIRATORY_TRACT
  Filled 2015-11-07 (×3): qty 3

## 2015-11-07 MED ORDER — LABETALOL HCL 200 MG PO TABS
300.0000 mg | ORAL_TABLET | Freq: Two times a day (BID) | ORAL | Status: DC
  Administered 2015-11-07 – 2015-11-12 (×8): 300 mg via ORAL
  Filled 2015-11-07 (×13): qty 1

## 2015-11-07 MED ORDER — AMLODIPINE BESYLATE 10 MG PO TABS
10.0000 mg | ORAL_TABLET | Freq: Every day | ORAL | Status: DC
  Administered 2015-11-08 – 2015-11-12 (×4): 10 mg via ORAL
  Filled 2015-11-07 (×5): qty 1

## 2015-11-07 MED ORDER — ENOXAPARIN SODIUM 40 MG/0.4ML ~~LOC~~ SOLN
40.0000 mg | SUBCUTANEOUS | Status: DC
  Administered 2015-11-07 – 2015-11-11 (×3): 40 mg via SUBCUTANEOUS
  Filled 2015-11-07 (×5): qty 0.4

## 2015-11-07 MED ORDER — PRAMOXINE HCL 1 % RE FOAM
1.0000 "application " | Freq: Three times a day (TID) | RECTAL | Status: DC | PRN
  Filled 2015-11-07: qty 15

## 2015-11-07 MED ORDER — ACETAMINOPHEN 325 MG PO TABS: 650.0000 mg | ORAL_TABLET | Freq: Four times a day (QID) | ORAL | Status: DC | PRN

## 2015-11-07 MED ORDER — ACETAMINOPHEN 650 MG RE SUPP: 650.0000 mg | Freq: Four times a day (QID) | RECTAL | Status: DC | PRN

## 2015-11-07 MED ORDER — ALPRAZOLAM 0.25 MG PO TABS
0.1250 mg | ORAL_TABLET | Freq: Every day | ORAL | Status: DC | PRN
  Administered 2015-11-07 – 2015-11-08 (×2): 0.25 mg via ORAL
  Filled 2015-11-07 (×2): qty 1

## 2015-11-07 MED ORDER — ASPIRIN 81 MG PO CHEW
81.0000 mg | CHEWABLE_TABLET | Freq: Every day | ORAL | Status: DC
  Administered 2015-11-08 – 2015-11-12 (×4): 81 mg via ORAL
  Filled 2015-11-07 (×5): qty 1

## 2015-11-07 MED ORDER — ONDANSETRON HCL 4 MG PO TABS: 4.0000 mg | ORAL_TABLET | Freq: Four times a day (QID) | ORAL | Status: DC | PRN

## 2015-11-07 MED ORDER — ONDANSETRON HCL 4 MG/2ML IJ SOLN
4.0000 mg | Freq: Four times a day (QID) | INTRAMUSCULAR | Status: DC | PRN
Start: 2015-11-07 — End: 2015-11-12

## 2015-11-07 NOTE — Consult Note (Signed)
Reason for Consult:   Elevated Tropponin  Requesting Physician: ED Primary Cardiologist Dr Harrington Challenger  HPI:   74 y/o AA male with a history of severe AS and prior CVA. He was last admitted in April 2016 with UTI and confusion. He was seen by Dr Burt Knack for possible TAVR but was not felt to be a candidate- "I think in the setting of his poor functional capacity, the risk/benefit of pursuing further treatment for his aortic stenosis is unfavorable. There is a high likelihood that his functional capacity would not be significantly improved. The patient's family understands that without treatment, he will likely develop progressive congestive heart failure and that is overall prognosis is poor. The patient and his family strongly favor a conservative approach with palliative medical therapy." I don't see where Palliative Care ever saw the pt, and I note that he is a full code. His family brought him to the ER today with mental status change and suspected UTI, same as his admission in April. The daughter reports no chest pain, syncope, or unusual dyspnea. We were called to see for a Troponin of 0.09.   PMHx:  Past Medical History  Diagnosis Date  . Stroke ([place])   . Hypertension   . Hyperlipidemia   . Aortic stenosis   . CHF (congestive heart failure) ([place])   . MGUS (monoclonal gammopathy of unknown significance)   . Goiter   . Rhabdomyolysis   . UTI (urinary tract infection) 05/09/2014  . Dementia     Past Surgical History  Procedure Laterality Date  . Circumcision      SOCHx:  reports that he has never smoked. He has never used smokeless tobacco. He reports that he does not drink alcohol or use illicit drugs.  FAMHx: Family History  Problem Relation Age of Onset  . Family history unknown: Yes    ALLERGIES: No Known Allergies  ROS: Review of Systems: Per daughter General: negative for chills, fever, night sweats or weight changes.  Cardiovascular: negative for chest  pain, dyspnea on exertion, edema, orthopnea, palpitations, paroxysmal nocturnal dyspnea or shortness of breath HEENT: negative for any visual disturbances, blindness, glaucoma Dermatological: negative for rash Respiratory: negative for cough, hemoptysis, or wheezing Urologic: negative for hematuria or dysuria Abdominal: negative for nausea, vomiting, diarrhea, bright red blood per rectum, melena, or hematemesis Neurologic: negative for visual changes, syncope, or dizziness Musculoskeletal: negative for back pain, joint pain, or swelling Psych: cooperative and appropriate Pt has a large goiter, followed by Dr Dwyane Dee All other systems reviewed and are otherwise negative except as noted above.   HOME MEDICATIONS: Prior to Admission medications   Medication Sig Start Date End Date Taking? Authorizing Provider  ALPRAZolam Duanne Moron) 0.25 MG tablet Take 0.5-1 tablets by mouth daily as needed for anxiety.  09/11/15  Yes Historical Provider, MD  amLODipine (NORVASC) 10 MG tablet Take 10 mg by mouth daily.   Yes Historical Provider, MD  aspirin 81 MG tablet Take 81 mg by mouth daily.   Yes Historical Provider, MD  atorvastatin (LIPITOR) 10 MG tablet Take 10 mg by mouth daily.   Yes Historical Provider, MD  cephALEXin (KEFLEX) 500 MG capsule Take 1 capsule (500 mg total) by mouth 4 (four) times daily. Take all of medicine and drink lots of fluids Patient taking differently: Take 500 mg by mouth 3 (three) times daily. Take all of medicine and drink lots of fluids 11/02/15  Yes Billy Fischer, MD  furosemide (  LASIX) 40 MG tablet Take 40 mg by mouth daily. 04/28/15  Yes Historical Provider, MD  hydrALAZINE (APRESOLINE) 100 MG tablet Take 50-100 mg by mouth 2 (two) times daily. Take 100 mg every morning and 50 mg every night. 06/14/15  Yes Historical Provider, MD  labetalol (NORMODYNE) 300 MG tablet Take 1 tablet (300 mg total) by mouth 2 (two) times daily. 05/09/14  Yes [name] York, PA-C  pramoxine  (PROCTOFOAM) 1 % foam Place 1 application rectally 3 (three) times daily as needed for itching. 06/19/15  Yes Dorie Rank, MD  albuterol (PROVENTIL) (2.5 MG/3ML) 0.083% nebulizer solution Take 3 mLs (2.5 mg total) by nebulization every 4 (four) hours as needed for wheezing or shortness of breath. Patient not taking: Reported on 11/07/2015 04/02/15   Jonetta Osgood, MD  docusate sodium (COLACE) 100 MG capsule Take 1 capsule (100 mg total) by mouth every 12 (twelve) hours. Patient not taking: Reported on 09/10/2015 06/19/15   Dorie Rank, MD  ipratropium-albuterol (DUONEB) 0.5-2.5 (3) MG/3ML SOLN Take 3 mLs by nebulization 2 (two) times daily. Patient not taking: Reported on 06/19/2015 04/05/15   Jonetta Osgood, MD  potassium chloride SA (K-DUR,KLOR-CON) 20 MEQ tablet Take 1 tablet (20 mEq total) by mouth daily. Patient not taking: Reported on 06/19/2015 04/05/15   Jonetta Osgood, MD    HOSPITAL MEDICATIONS: I have reviewed the patient's current medications.  VITALS: Blood pressure 138/69, pulse 64, temperature 98.3 F (36.8 C), temperature source Oral, resp. rate 27, SpO2 91 %.  PHYSICAL EXAM: General appearance: alert, cooperative and no distress Neck: no JVD and enlarged goiter on Lt Lungs: scattered rales Heart: regular rate and rhythm and 2/6 systolic murmur AOV, diminnished S2 Abdomen: soft, non-tender; bowel sounds normal; no masses,  no organomegaly Extremities: extremities normal, atraumatic, no cyanosis or edema Pulses: 2+ and symmetric Skin: Skin color, texture, turgor normal. No rashes or lesions Neurologic: Grossly normal , Lt sided weakness LABS: Results for orders placed or performed during the hospital encounter of 11/07/15 (from the past 24 hour(s))  Basic metabolic panel     Status: Abnormal   Collection Time: 11/07/15 11:43 AM  Result Value Ref Range   Sodium 146 (H) 135 - 145 mmol/L   Potassium 3.6 3.5 - 5.1 mmol/L   Chloride 112 (H) 101 - 111 mmol/L   CO2 25 22 - 32  mmol/L   Glucose, Bld 108 (H) 65 - 99 mg/dL   BUN 21 (H) 6 - 20 mg/dL   Creatinine, Ser 0.85 0.61 - 1.24 mg/dL   Calcium 9.9 8.9 - 10.3 mg/dL   GFR calc non Af Amer >60 >60 mL/min   GFR calc Af Amer >60 >60 mL/min   Anion gap 9 5 - 15  CBC     Status: Abnormal   Collection Time: 11/07/15 11:43 AM  Result Value Ref Range   WBC 3.9 (L) 4.0 - 10.5 K/uL   RBC 3.64 (L) 4.22 - 5.81 MIL/uL   Hemoglobin 9.9 (L) 13.0 - 17.0 g/dL   HCT 30.9 (L) 39.0 - 52.0 %   MCV 84.9 78.0 - 100.0 fL   MCH 27.2 26.0 - 34.0 pg   MCHC 32.0 30.0 - 36.0 g/dL   RDW 13.7 11.5 - 15.5 %   Platelets 145 (L) 150 - 400 K/uL  Troponin I     Status: Abnormal   Collection Time: 11/07/15 11:43 AM  Result Value Ref Range   Troponin I 0.09 (H) <0.031 ng/mL  Urinalysis,  Routine w reflex microscopic (not at [facility]     Status: Abnormal   Collection Time: 11/07/15 12:21 PM  Result Value Ref Range   Color, Urine YELLOW YELLOW   APPearance CLEAR CLEAR   Specific Gravity, Urine 1.009 1.005 - 1.030   pH 5.5 5.0 - 8.0   Glucose, UA NEGATIVE NEGATIVE mg/dL   Hgb urine dipstick SMALL (A) NEGATIVE   Bilirubin Urine NEGATIVE NEGATIVE   Ketones, ur NEGATIVE NEGATIVE mg/dL   Protein, ur NEGATIVE NEGATIVE mg/dL   Urobilinogen, UA 0.2 0.0 - 1.0 mg/dL   Nitrite NEGATIVE NEGATIVE   Leukocytes, UA NEGATIVE NEGATIVE  Urine microscopic-add on     Status: None   Collection Time: 11/07/15 12:21 PM  Result Value Ref Range   RBC / HPF 11-20 <3 RBC/hpf  I-Stat CG4 Lactic Acid, ED     Status: None   Collection Time: 11/07/15  2:25 PM  Result Value Ref Range   Lactic Acid, Venous 0.98 0.5 - 2.0 mmol/L    EKG: NSR, LVH with repol changes  IMAGING: Dg Chest 2 View  11/07/2015  CLINICAL DATA:  Weakness.  Dementia. EXAM: CHEST  2 VIEW COMPARISON:  04/04/2015. FINDINGS: Mediastinum and hilar structures are normal. Cardiomegaly. Improving bilateral pulmonary alveolar infiltrates are noted. No pleural effusion or pneumothorax. No acute bony  abnormality. Degenerative changes both shoulders. Exam IMPRESSION: 1. Bilateral pulmonary alveolar infiltrates, improved from prior exam . 2. Stable cardiomegaly. Electronically Signed   By: Marcello Moores  Register   On: 11/07/2015 12:07    IMPRESSION: Principal Problem:   UTI (lower urinary tract infection) Active Problems:   Demand ischemia (HCC)   Severe aortic stenosis   Hypertension   Chronic renal insufficiency, stage III (moderate)   Anemia-MGUS   H/O: CVA  with Lt hemiparesis   Goiter   Diastolic dysfunction, left ventricle   Dyslipidemia   RECOMMENDATION: No further cardiac work up, no further Troponin's. We will be available if needed. Pt already has f/u with Dr Harrington Challenger scheduled.   Time Spent Directly with Patient: 76 minutes  Kerin Ransom, [place] beeper 11/07/2015, 2:37 PM    Agree with note written by Kerin Ransom [facility]  Pt admitted with UTI. No cardiac complaints. Known crit AS. Mild trop elevation of no significance. No further w/u required. Call if we can be of further assistance.  Quay Burow 11/07/2015 5:20 PM

## 2015-11-07 NOTE — ED Provider Notes (Signed)
CSN: [id number]     Arrival date & time 11/07/15  1015 History   First MD Initiated Contact with Patient 11/07/15 1112     Chief Complaint  Patient presents with  . Weakness  . Aggressive Behavior     (Consider location/radiation/quality/duration/timing/severity/associated sxs/prior Treatment) HPI Comments: Family states that he has been having worsening aggressive behavior over the last couple of days. He was diagnosed with uti 4 days ago and was put on keflex. Daughter states that he can usually ambulate with a walker on his own but he is requiring assistance. He denies pain anywhere. She states that he has a indwelling foley, has mild cough.  The history is provided by a relative and a caregiver.    Past Medical History  Diagnosis Date  . Stroke ([place])   . Hypertension   . Hyperlipidemia   . Aortic stenosis   . CHF (congestive heart failure) ([place])   . MGUS (monoclonal gammopathy of unknown significance)   . Goiter   . Rhabdomyolysis   . UTI (urinary tract infection) 05/09/2014  . Dementia    Past Surgical History  Procedure Laterality Date  . Circumcision     Family History  Problem Relation Age of Onset  . Family history unknown: Yes   Social History  Substance Use Topics  . Smoking status: Never Smoker   . Smokeless tobacco: Never Used  . Alcohol Use: No    Review of Systems  Unable to perform ROS: Dementia  All other systems reviewed and are negative.     Allergies  Review of patient's allergies indicates no known allergies.  Home Medications   Prior to Admission medications   Medication Sig Start Date End Date Taking? Authorizing Provider  albuterol (PROVENTIL) (2.5 MG/3ML) 0.083% nebulizer solution Take 3 mLs (2.5 mg total) by nebulization every 4 (four) hours as needed for wheezing or shortness of breath. Patient not taking: Reported on 06/19/2015 04/02/15   Jonetta Osgood, MD  amLODipine (NORVASC) 10 MG tablet Take 10 mg by mouth daily.     Historical Provider, MD  aspirin 81 MG tablet Take 81 mg by mouth daily.    Historical Provider, MD  atorvastatin (LIPITOR) 10 MG tablet Take 10 mg by mouth daily.    Historical Provider, MD  cephALEXin (KEFLEX) 500 MG capsule Take 1 capsule (500 mg total) by mouth 4 (four) times daily. Take all of medicine and drink lots of fluids 11/02/15   Billy Fischer, MD  docusate sodium (COLACE) 100 MG capsule Take 1 capsule (100 mg total) by mouth every 12 (twelve) hours. Patient not taking: Reported on 09/10/2015 06/19/15   Dorie Rank, MD  furosemide (LASIX) 40 MG tablet Take 40 mg by mouth daily. 04/28/15   Historical Provider, MD  hydrALAZINE (APRESOLINE) 100 MG tablet Take 50-100 mg by mouth 2 (two) times daily. Take 100 mg every morning and 50 mg every night. 06/14/15   Historical Provider, MD  ipratropium-albuterol (DUONEB) 0.5-2.5 (3) MG/3ML SOLN Take 3 mLs by nebulization 2 (two) times daily. Patient not taking: Reported on 06/19/2015 04/05/15   Jonetta Osgood, MD  labetalol (NORMODYNE) 300 MG tablet Take 1 tablet (300 mg total) by mouth 2 (two) times daily. 05/09/14   [name] York, PA-C  levofloxacin (LEVAQUIN) 500 MG tablet Take 1 tablet by mouth daily. Starting 09/05/15 for 7 days. 09/05/15   Historical Provider, MD  potassium chloride SA (K-DUR,KLOR-CON) 20 MEQ tablet Take 1 tablet (20 mEq total) by mouth daily.  Patient not taking: Reported on 06/19/2015 04/05/15   Jonetta Osgood, MD  pramoxine (PROCTOFOAM) 1 % foam Place 1 application rectally 3 (three) times daily as needed for itching. Patient not taking: Reported on 09/10/2015 06/19/15   Dorie Rank, MD   BP 128/58 mmHg  Pulse 67  Temp(Src) 98.3 F (36.8 C) (Oral)  Resp 18  SpO2 99% Physical Exam  Constitutional: He appears well-developed.  HENT:  Head: Normocephalic and atraumatic.  Cardiovascular:  Murmur heard. Pulmonary/Chest: Effort normal and breath sounds normal.  Abdominal: Soft. Bowel sounds are normal.  Musculoskeletal: Normal  range of motion.  Neurological: He is alert.  Pt unable to ambulate without assistance from 2 people  Skin: Skin is warm and dry.  Psychiatric: He has a normal mood and affect.    ED Course  Procedures (including critical care time) Labs Review Labs Reviewed  BASIC METABOLIC PANEL - Abnormal; Notable for the following:    Sodium 146 (*)    Chloride 112 (*)    Glucose, Bld 108 (*)    BUN 21 (*)    All other components within normal limits  CBC - Abnormal; Notable for the following:    WBC 3.9 (*)    RBC 3.64 (*)    Hemoglobin 9.9 (*)    HCT 30.9 (*)    Platelets 145 (*)    All other components within normal limits  URINALYSIS, ROUTINE W REFLEX MICROSCOPIC (NOT AT [facility]) - Abnormal; Notable for the following:    Hgb urine dipstick SMALL (*)    All other components within normal limits  TROPONIN I - Abnormal; Notable for the following:    Troponin I 0.09 (*)    All other components within normal limits  URINE MICROSCOPIC-ADD ON  I-STAT CG4 LACTIC ACID, ED    Imaging Review Dg Chest 2 View  11/07/2015  CLINICAL DATA:  Weakness.  Dementia. EXAM: CHEST  2 VIEW COMPARISON:  04/04/2015. FINDINGS: Mediastinum and hilar structures are normal. Cardiomegaly. Improving bilateral pulmonary alveolar infiltrates are noted. No pleural effusion or pneumothorax. No acute bony abnormality. Degenerative changes both shoulders. Exam IMPRESSION: 1. Bilateral pulmonary alveolar infiltrates, improved from prior exam . 2. Stable cardiomegaly. Electronically Signed   By: Marcello Moores  Register   On: 11/07/2015 12:07   I have personally reviewed and evaluated these images and lab results as part of my medical decision-making.   EKG Interpretation   Date/Time:  Wednesday November 07 2015 10:47:40 EST Ventricular Rate:  66 PR Interval:  407 QRS Duration: 83 QT Interval:  416 QTC Calculation: 436 R Axis:   106 Text Interpretation:  Sinus or ectopic atrial rhythm Prolonged PR interval  Probable  anterolateral infarct, age indeterm Abnormal T, consider  ischemia, lateral leads downward sloping t waves on ecg 13/march 2013  Otherwise no significant change Confirmed by FLOYD MD, DANIEL ([id number]) on  11/07/2015 10:55:34 AM      MDM   Final diagnoses:  Elevated troponin  Weakness  Agitation  Dementia, with behavioral disturbance    Pt with elevated trop. No sign of heart failure at this time  2:02 PM Cardiology to consult.  3:06 PM Discussed findings with pt and family and pt to be admitted to the hospital for weakness and elevated trop  Glendell Docker, NP 11/07/15 [place], MD 11/07/15 1520

## 2015-11-07 NOTE — ED Notes (Signed)
Hx of dementia, aggressive behavior worsening. Recently diagnosed with UTI and has been on antibiotic since Friday, but family member states he's been more weak than usual. Was given a xanax prior to arrival to help keep patient cooperative. Family concerned about worsening aggression and weakness.

## 2015-11-07 NOTE — Progress Notes (Signed)
ED CM notified [facility] home care coordinator of pt admission so pt can be followed for potential d/c needs

## 2015-11-07 NOTE — H&P (Addendum)
[facility] History and Physical  ZIMRI BRENNEN WUJ:[id number] DOB: [birth year] DOA: 11/07/2015  Referring physician: [name] PCP: [name], MD   Chief Complaint: generalized weakness.   HPI: KARMINE KAUER is a 74 y.o. male with h/o hypertension, hyperlipidemia, stroke, MGUS, dementia was brought in by family members for worsening generalized weakness. On further work up in ED, his labs revealed elevated sodium, elevated troponins and his UA does not show any infection. He denies any chest pain, sob or any other complaints. As per the family at bedside, he stood up at tbedside but reports gen weakness. He was referred to medical service for generalized weakness, dehydration and elevated troponins. No fevers or chills.    Review of Systems:  Pt confused at baseline, most of the history is obtained from daughter at bedside.    Past Medical History  Diagnosis Date  . Stroke ([place])   . Hypertension   . Hyperlipidemia   . Aortic stenosis   . CHF (congestive heart failure) ([place])   . MGUS (monoclonal gammopathy of unknown significance)   . Goiter   . Rhabdomyolysis   . UTI (urinary tract infection) 05/09/2014  . Dementia    Past Surgical History  Procedure Laterality Date  . Circumcision     Social History:  reports that he has never smoked. He has never used smokeless tobacco. He reports that he does not drink alcohol or use illicit drugs.  No Known Allergies  Family History  Problem Relation Age of Onset  . Family history unknown: Yes    Prior to Admission medications   Medication Sig Start Date End Date Taking? Authorizing Provider  ALPRAZolam Duanne Moron) 0.25 MG tablet Take 0.5-1 tablets by mouth daily as needed for anxiety.  09/11/15  Yes Historical Provider, MD  amLODipine (NORVASC) 10 MG tablet Take 10 mg by mouth daily.   Yes Historical Provider, MD  aspirin 81 MG tablet Take 81 mg by mouth daily.   Yes Historical Provider, MD  atorvastatin (LIPITOR) 10 MG tablet  Take 10 mg by mouth daily.   Yes Historical Provider, MD  cephALEXin (KEFLEX) 500 MG capsule Take 1 capsule (500 mg total) by mouth 4 (four) times daily. Take all of medicine and drink lots of fluids Patient taking differently: Take 500 mg by mouth 3 (three) times daily. Take all of medicine and drink lots of fluids 11/02/15  Yes Billy Fischer, MD  furosemide (LASIX) 40 MG tablet Take 40 mg by mouth daily. 04/28/15  Yes Historical Provider, MD  hydrALAZINE (APRESOLINE) 100 MG tablet Take 50-100 mg by mouth 2 (two) times daily. Take 100 mg every morning and 50 mg every night. 06/14/15  Yes Historical Provider, MD  labetalol (NORMODYNE) 300 MG tablet Take 1 tablet (300 mg total) by mouth 2 (two) times daily. 05/09/14  Yes [name] [place], PA-C  pramoxine (PROCTOFOAM) 1 % foam Place 1 application rectally 3 (three) times daily as needed for itching. 06/19/15  Yes Dorie Rank, MD  albuterol (PROVENTIL) (2.5 MG/3ML) 0.083% nebulizer solution Take 3 mLs (2.5 mg total) by nebulization every 4 (four) hours as needed for wheezing or shortness of breath. Patient not taking: Reported on 11/07/2015 04/02/15   Jonetta Osgood, MD  docusate sodium (COLACE) 100 MG capsule Take 1 capsule (100 mg total) by mouth every 12 (twelve) hours. Patient not taking: Reported on 09/10/2015 06/19/15   Dorie Rank, MD  ipratropium-albuterol (DUONEB) 0.5-2.5 (3) MG/3ML SOLN Take 3 mLs by nebulization 2 (two) times  daily. Patient not taking: Reported on 06/19/2015 04/05/15   Jonetta Osgood, MD  potassium chloride SA (K-DUR,KLOR-CON) 20 MEQ tablet Take 1 tablet (20 mEq total) by mouth daily. Patient not taking: Reported on 06/19/2015 04/05/15   Jonetta Osgood, MD   Physical Exam: Filed Vitals:   11/07/15 1300 11/07/15 1400 11/07/15 1500 11/07/15 1600  BP: 139/71 138/69 143/75 157/75  Pulse: 70 64 72 75  Temp:      TempSrc:      Resp: 23 27 30  32  SpO2: 95% 91% 92% 89%    Wt Readings from Last 3 Encounters:  05/24/15 56.609 kg (124  lb 12.8 oz)  04/27/15 57.97 kg (127 lb 12.8 oz)  04/13/15 66.225 kg (146 lb)    General:  Appears calm and comfortable Eyes: PERRL, normal lids, irises & conjunctiva Neck: no LAD, masses or thyromegaly Cardiovascular: RRR,murmer present. No LE edema. Telemetry: SR, no arrhythmias  Respiratory: CTA bilaterally, no w/r/r. Normal respiratory effort. Abdomen: soft, ntnd Skin: no rash or induration seen on limited exam Musculoskeletal: grossly normal tone BUE/BLE Neurologic: grossly non-focal.          Labs on Admission:  Basic Metabolic Panel:  Recent Labs Lab 11/02/15 1820 11/07/15 1143  NA 144 146*  K 4.2 3.6  CL 113* 112*  CO2  --  25  GLUCOSE 109* 108*  BUN 22* 21*  CREATININE 1.20 0.85  CALCIUM  --  9.9   Liver Function Tests: No results for input(s): AST, ALT, ALKPHOS, BILITOT, PROT, ALBUMIN in the last 168 hours. No results for input(s): LIPASE, AMYLASE in the last 168 hours. No results for input(s): AMMONIA in the last 168 hours. CBC:  Recent Labs Lab 11/02/15 1820 11/07/15 1143  WBC  --  3.9*  HGB 11.2* 9.9*  HCT 33.0* 30.9*  MCV  --  84.9  PLT  --  145*   Cardiac Enzymes:  Recent Labs Lab 11/07/15 1143  TROPONINI 0.09*    BNP (last 3 results)  Recent Labs  04/02/15 1533  BNP 2635.5*    ProBNP (last 3 results)  Recent Labs  11/16/14 1842 12/11/14 1448 05/24/15 0906  PROBNP 5173.0* 1169.0* 789.0*    CBG: No results for input(s): GLUCAP in the last 168 hours.  Radiological Exams on Admission: Dg Chest 2 View  11/07/2015  CLINICAL DATA:  Weakness.  Dementia. EXAM: CHEST  2 VIEW COMPARISON:  04/04/2015. FINDINGS: Mediastinum and hilar structures are normal. Cardiomegaly. Improving bilateral pulmonary alveolar infiltrates are noted. No pleural effusion or pneumothorax. No acute bony abnormality. Degenerative changes both shoulders. Exam IMPRESSION: 1. Bilateral pulmonary alveolar infiltrates, improved from prior exam . 2. Stable  cardiomegaly. Electronically Signed   By: Marcello Moores  Register   On: 11/07/2015 12:07    EKG: Independently reviewed. Sinus rhythm, prolonged PR interval, t wave inversions int he later leads.   Assessment/Plan Principal Problem:   UTI (lower urinary tract infection) Active Problems:   Diastolic dysfunction, left ventricle   Hypertension   Chronic renal insufficiency, stage III (moderate)   Anemia-MGUS   Demand ischemia (HCC)   Dyslipidemia   Severe aortic stenosis   H/O: CVA  with Lt hemiparesis   Goiter   Weakness   Generalized weakness, probably from worsening of deconditioning vs dehydration.  - gentle hydration with half normal saline, . Repeat BMP in am.  - PT eval in am for further eval.  Normal lactic acid.   Elevated troponin: he denies chest pain or sob.  -  EKG shows slight t wave inversions.  - cardiology consulted by [name] and recommended no further work up at this time.  - not following any more troponins.    Dementia: no agitation, at baseline.   H/o CVA: no new deficits.   Hypertension: Sub optimal. Resume home meds.    Code Status: full code.  DVT Prophylaxis: Family Communication: family at bedside.  Disposition Plan: pending PT eval in am.   Time spent: 50 min  [facility] Pager [phone]

## 2015-11-08 ENCOUNTER — Observation Stay (HOSPITAL_COMMUNITY): Payer: Medicare Other

## 2015-11-08 DIAGNOSIS — I248 Other forms of acute ischemic heart disease: Secondary | ICD-10-CM

## 2015-11-08 DIAGNOSIS — J690 Pneumonitis due to inhalation of food and vomit: Secondary | ICD-10-CM | POA: Diagnosis present

## 2015-11-08 DIAGNOSIS — I10 Essential (primary) hypertension: Secondary | ICD-10-CM | POA: Diagnosis not present

## 2015-11-08 LAB — BASIC METABOLIC PANEL
ANION GAP: 6 (ref 5–15)
BUN: 20 mg/dL (ref 6–20)
CALCIUM: 9.9 mg/dL (ref 8.9–10.3)
CHLORIDE: 111 mmol/L (ref 101–111)
CO2: 28 mmol/L (ref 22–32)
CREATININE: 0.79 mg/dL (ref 0.61–1.24)
GFR calc non Af Amer: >60 mL/min (ref >60)
Glucose, Bld: 112 mg/dL — ABNORMAL HIGH (ref 65–99)
Potassium: 3.7 mmol/L (ref 3.5–5.1)
SODIUM: 145 mmol/L (ref 135–145)

## 2015-11-08 LAB — TSH

## 2015-11-08 MED ORDER — PIPERACILLIN-TAZOBACTAM 3.375 G IVPB
3.3750 g | Freq: Three times a day (TID) | INTRAVENOUS | Status: DC
  Administered 2015-11-08 – 2015-11-12 (×12): 3.375 g via INTRAVENOUS
  Filled 2015-11-08 (×13): qty 50

## 2015-11-08 MED ORDER — RESOURCE THICKENUP CLEAR PO POWD
ORAL | Status: DC | PRN
  Filled 2015-11-08 (×2): qty 125

## 2015-11-08 MED ORDER — STARCH (THICKENING) PO POWD
ORAL | Status: DC | PRN
  Filled 2015-11-08: qty 227

## 2015-11-08 MED ORDER — HALOPERIDOL LACTATE 5 MG/ML IJ SOLN: 2.0000 mg | Freq: Four times a day (QID) | INTRAMUSCULAR | Status: DC | PRN

## 2015-11-08 NOTE — Progress Notes (Signed)
ANTIBIOTIC CONSULT NOTE - INITIAL  Pharmacy Consult for Zosyn Indication: rule out pneumonia  No Known Allergies  Patient Measurements: Height: 5\' 5"  (165.1 cm) Weight: 109 lb 9.1 oz (49.7 kg) IBW/kg (Calculated) : 61.5  Vital Signs: Temp: 97.8 F (36.6 C) (11/10 0443) Temp Source: Oral (11/10 0443) BP: 143/85 mmHg (11/10 0906) Pulse Rate: 80 (11/10 0906) Intake/Output from previous day: 11/09 0701 - 11/10 0700 In: -  Out: [place] [Urine:1275]  Labs:  Recent Labs  11/07/15 1143 11/08/15 0515  WBC 3.9*  --   HGB 9.9*  --   PLT 145*  --   CREATININE 0.85 0.79   Estimated Creatinine Clearance: 57.8 mL/min (by C-G formula based on Cr of 0.79). No results for input(s): VANCOTROUGH, VANCOPEAK, VANCORANDOM, GENTTROUGH, GENTPEAK, GENTRANDOM, TOBRATROUGH, TOBRAPEAK, TOBRARND, AMIKACINPEAK, AMIKACINTROU, AMIKACIN in the last 72 hours.   Microbiology: Recent Results (from the past 720 hour(s))  Urine culture     Status: None   Collection Time: 11/02/15  9:21 PM  Result Value Ref Range Status   Specimen Description URINE, CATHETERIZED  Final   Special Requests Normal  Final   Culture MULTIPLE SPECIES PRESENT, SUGGEST RECOLLECTION  Final   Report Status 11/04/2015 FINAL  Final    Medical History: Past Medical History  Diagnosis Date  . Stroke ([place])   . Hypertension   . Hyperlipidemia   . Aortic stenosis   . CHF (congestive heart failure) ([place])   . MGUS (monoclonal gammopathy of unknown significance)   . Goiter   . Rhabdomyolysis   . UTI (urinary tract infection) 05/09/2014  . Dementia     Medications:  Anti-infectives    None     Assessment: 76 yoM admitted 11/9 with generalized weakness.  CXR (11/10) with worsening airspace process over the and posterior mid to lower lungs (R>L) suggesting infection.  Pharmacy is now consulted to dose Zosyn for possible aspiration pneumonia.  Today, 11/08/2015 Tmax/24h: Afebrile WBC low on admission, 3.9 (11/9) SCr 0.79 with  CrCl ~ 58 ml/min  Goal of Therapy:  Appropriate abx dosing, eradication of infection.   Plan:  Zosyn 3.375g IV Q8H infused over 4hrs. Follow up renal fxn, culture results, and clinical course.   Gretta Arab PharmD, BCPS Pager [phone] 11/08/2015 12:48 PM

## 2015-11-08 NOTE — Progress Notes (Signed)
Initial Nutrition Assessment  DOCUMENTATION CODES:   Underweight  INTERVENTION:  - Will order Magic Cup once/day, this supplement provides 290 kcal and 9 grams of protein - RD will continue to monitor for needs   NUTRITION DIAGNOSIS:   Underweight related to chronic illness as evidenced by other (see comment) (BMI: 18.3 kg/m2).  GOAL:   Patient will meet greater than or equal to 90% of their needs  MONITOR:   PO intake, Supplement acceptance, Weight trends, Labs, Skin, I & O's  REASON FOR ASSESSMENT:   Other (Comment) (Underweight BMI)  ASSESSMENT:   74 y.o. male with h/o hypertension, hyperlipidemia, stroke, MGUS, dementia was brought in by family members for worsening generalized weakness. On further work up in ED, his labs revealed elevated sodium, elevated troponins and his UA does not show any infection. He denies any chest pain, sob or any other complaints. As per the family at bedside, he stood up at tbedside but reports gen weakness. He was referred to medical service for generalized weakness, dehydration and elevated troponins. No fevers or chills.   Pt seen for underweight BMI. No intakes documented at this time. Notes indicate that pt has hx of dementia and has been agitated with increased agitation the past few days. Spoke with daughter, who was at bedside, who reported all information.  She states that pt has had a good appetite since admission and that PTA he had no recent fluctuations in appetite. She also states stable weight recently. Per chart review, pt has lost 15 lbs (12% body weight) in the past 6 months which is significant for time frame. Unable to complete physical assessment at this time but suspect some degree of malnutrition is present.  Daughter states that pt has been on mechanical soft, nectar-thick liquids diet since May 2016 and has had no issue with this. She is upset about thin liquids being given prior in admission and states that pt seems to be  coughing and having a rattling sound in his chest.   Will order Magic Cup once/day to supplement and increase if pt tolerates this supplement well. When it "melts" it is still appropriate for pts needing nectar-thick liquid consistency. Unsure if pt was meeting needs PTA. Medications reviewed. Labs reviewed.   Diet Order:  DIET SOFT Room service appropriate?: Yes; Fluid consistency:: Nectar Thick  Skin:  Reviewed, no issues  Last BM:  11/9  Height:   Ht Readings from Last 1 Encounters:  11/07/15 5\' 5"  (1.651 m)    Weight:   Wt Readings from Last 1 Encounters:  11/07/15 109 lb 9.1 oz (49.7 kg)    Ideal Body Weight:  61.82 kg (kg)  BMI:  Body mass index is 18.23 kg/(m^2).  Estimated Nutritional Needs:   Kcal:  1300-1500  Protein:  50-60 grams  Fluid:  1.8-2 L/day  EDUCATION NEEDS:   No education needs identified at this time     Jarome Matin, RD, LDN Inpatient Clinical Dietitian Pager # [phone] After hours/weekend pager # [phone]

## 2015-11-08 NOTE — Progress Notes (Signed)
Patient family at bedside. Cup of water sitting at bedside. Daughter extremely upset because she states that her dad can not have thin liquids because he will aspirate. Removed cup of water. Paged MD. Diet in nectar thick liquids. Will get MD to order thickening liquids. Will continue to monitor.

## 2015-11-08 NOTE — Progress Notes (Signed)
[facility] PROGRESS NOTE  Bradley [name] [id number] DOB: 11-17-1941 DOA: 11/07/2015 PCP: [name], MD Interim summary: Bradley [name] is a 74 y.o. male with h/o hypertension, hyperlipidemia, stroke, MGUS, dementia was brought in by family members for worsening generalized weakness. CXR on admission showed bilateral infiltrates improved from prior exam. Overnight pt apparently had some water with a straw by mistake, and family was concerned.  He is not hypoxic, no fever and but he was found to be tachypnic and repeat CXR was done showed worsening air space disease probably infectious etiology. He was started on zosyn for presumed aspiration pneumonia.  Assessment/Plan: Aspiration pneumonia; - on telemetry and started him on zosyn.  - not requiring oxygen, will benefit from SLP eval.  - discussed with daughter at bedside, who is visibly upset.    Generalized weakness: PT eval for deconditioning.   Hypernatremia: - resolved with fluids.   Anemia: normocytic,  Stable.  Repeat in am.   Elevated troponin: no chest pain, cardiology consulted by [name] and no  Further work up warranted   Code Status: full code.  Family Communication: daughter at bedside Disposition Plan: home by Saturday when pneumonia improves.    Consultants:  none  Procedures:  none  Antibiotics:  Zosyn 11/10  HPI/Subjective: Reports feeling sob   Objective: Filed Vitals:   11/08/15 1333  BP: 120/64  Pulse: 73  Temp: 98.8 F (37.1 C)  Resp: 19    Intake/Output Summary (Last 24 hours) at 11/08/15 1712 Last data filed at 11/08/15 1626  Gross per 24 hour  Intake      0 ml  Output   1450 ml  Net  -1450 ml   Filed Weights   11/07/15 1728  Weight: 49.7 kg (109 lb 9.1 oz)    Exam:   General:  Alert confused  Cardiovascular: s1s2  Respiratory: scattered rhonchi on the right   Abdomen: soft NT nd bs+  Musculoskeletal: no pedal edema.   Data Reviewed: Basic Metabolic  Panel:  Recent Labs Lab 11/02/15 1820 11/07/15 1143 11/08/15 0515  NA 144 146* 145  K 4.2 3.6 3.7  CL 113* 112* 111  CO2  --  25 28  GLUCOSE 109* 108* 112*  BUN 22* 21* 20  CREATININE 1.20 0.85 0.79  CALCIUM  --  9.9 9.9   Liver Function Tests: No results for input(s): AST, ALT, ALKPHOS, BILITOT, PROT, ALBUMIN in the last 168 hours. No results for input(s): LIPASE, AMYLASE in the last 168 hours. No results for input(s): AMMONIA in the last 168 hours. CBC:  Recent Labs Lab 11/02/15 1820 11/07/15 1143  WBC  --  3.9*  HGB 11.2* 9.9*  HCT 33.0* 30.9*  MCV  --  84.9  PLT  --  145*   Cardiac Enzymes:  Recent Labs Lab 11/07/15 1143  TROPONINI 0.09*   BNP (last 3 results)  Recent Labs  04/02/15 1533  BNP 2635.5*    ProBNP (last 3 results)  Recent Labs  11/16/14 1842 12/11/14 1448 05/24/15 0906  PROBNP 5173.0* 1169.0* 789.0*    CBG: No results for input(s): GLUCAP in the last 168 hours.  Recent Results (from the past 240 hour(s))  Urine culture     Status: None   Collection Time: 11/02/15  9:21 PM  Result Value Ref Range Status   Specimen Description URINE, CATHETERIZED  Final   Special Requests Normal  Final   Culture MULTIPLE SPECIES PRESENT, SUGGEST RECOLLECTION  Final   Report Status 11/04/2015  FINAL  Final     Studies: Dg Chest 2 View  11/08/2015  CLINICAL DATA:  Aspiration pneumonia. EXAM: CHEST  2 VIEW COMPARISON:  11/07/2015. FINDINGS: Lungs are adequately inflated with worsening airspace opacification over the posterior mid to lower lungs right greater than left likely infection. Cannot exclude a small amount of posterior pleural fluid. Mild stable cardiomegaly. Calcified plaque is present over the aortic arch. Remainder of the exam is unchanged. IMPRESSION: Worsening airspace process over the and posterior mid to lower lungs right greater than left suggesting infection. Cannot exclude a small amount of pleural fluid. Electronically Signed    By: Marin Olp M.D.   On: 11/08/2015 11:37   Dg Chest 2 View  11/07/2015  CLINICAL DATA:  Weakness.  Dementia. EXAM: CHEST  2 VIEW COMPARISON:  04/04/2015. FINDINGS: Mediastinum and hilar structures are normal. Cardiomegaly. Improving bilateral pulmonary alveolar infiltrates are noted. No pleural effusion or pneumothorax. No acute bony abnormality. Degenerative changes both shoulders. Exam IMPRESSION: 1. Bilateral pulmonary alveolar infiltrates, improved from prior exam . 2. Stable cardiomegaly. Electronically Signed   By: Marcello Moores  Register   On: 11/07/2015 12:07    Scheduled Meds: . amLODipine  10 mg Oral Daily  . aspirin  81 mg Oral Daily  . atorvastatin  10 mg Oral Daily  . enoxaparin (LOVENOX) injection  40 mg Subcutaneous Q24H  . hydrALAZINE  100 mg Oral Daily  . hydrALAZINE  50 mg Oral QHS  . labetalol  300 mg Oral BID  . piperacillin-tazobactam (ZOSYN)  IV  3.375 g Intravenous 3 times per day   Continuous Infusions:   Principal Problem:   UTI (lower urinary tract infection) Active Problems:   Diastolic dysfunction, left ventricle   Hypertension   Chronic renal insufficiency, stage III (moderate)   Anemia-MGUS   Demand ischemia (HCC)   Dyslipidemia   Severe aortic stenosis   H/O: CVA  with Lt hemiparesis   Goiter   Weakness    Time spent:25 minutes    Smith Potenza  [facility] Pager [phone]  If 7PM-7AM, please contact night-coverage at [email or web address], password [email or web address] 11/08/2015, 5:12 PM

## 2015-11-08 NOTE — Care Management Note (Signed)
Case Management Note  Patient Details  Name: IZIAHA MAHOOD MRN: [id number] Date of Birth: 09-10-1941  Subjective/Objective: 74 y/o m admitted w/UTI. From home. Will confirm w/[employer] rep Tim if active or if following for [facility].PT cons-await recc.                   Action/Plan:d/c plan home.   Expected Discharge Date:   (UNKNOWN)               Expected Discharge Plan:  [place]  In-House Referral:     Discharge planning Services  CM Consult  Post Acute Care Choice:    Choice offered to:     DME Arranged:    DME Agency:     HH Arranged:    [place] Agency:     Status of Service:  In process, will continue to follow  Medicare Important Message Given:    Date Medicare IM Given:    Medicare IM give by:    Date Additional Medicare IM Given:    Additional Medicare Important Message give by:     If discussed at [place] of Stay Meetings, dates discussed:    Additional Comments:  Dessa Phi, RN 11/08/2015, 2:19 PM

## 2015-11-09 ENCOUNTER — Observation Stay (HOSPITAL_COMMUNITY): Payer: Medicare Other

## 2015-11-09 DIAGNOSIS — E870 Hyperosmolality and hypernatremia: Secondary | ICD-10-CM | POA: Diagnosis present

## 2015-11-09 DIAGNOSIS — Z79899 Other long term (current) drug therapy: Secondary | ICD-10-CM | POA: Diagnosis not present

## 2015-11-09 DIAGNOSIS — I10 Essential (primary) hypertension: Secondary | ICD-10-CM | POA: Diagnosis not present

## 2015-11-09 DIAGNOSIS — N390 Urinary tract infection, site not specified: Secondary | ICD-10-CM | POA: Diagnosis present

## 2015-11-09 DIAGNOSIS — I248 Other forms of acute ischemic heart disease: Secondary | ICD-10-CM | POA: Diagnosis present

## 2015-11-09 DIAGNOSIS — I350 Nonrheumatic aortic (valve) stenosis: Secondary | ICD-10-CM | POA: Diagnosis present

## 2015-11-09 DIAGNOSIS — I130 Hypertensive heart and chronic kidney disease with heart failure and stage 1 through stage 4 chronic kidney disease, or unspecified chronic kidney disease: Secondary | ICD-10-CM | POA: Diagnosis present

## 2015-11-09 DIAGNOSIS — I519 Heart disease, unspecified: Secondary | ICD-10-CM | POA: Diagnosis not present

## 2015-11-09 DIAGNOSIS — I69354 Hemiplegia and hemiparesis following cerebral infarction affecting left non-dominant side: Secondary | ICD-10-CM | POA: Diagnosis not present

## 2015-11-09 DIAGNOSIS — E049 Nontoxic goiter, unspecified: Secondary | ICD-10-CM | POA: Diagnosis present

## 2015-11-09 DIAGNOSIS — R946 Abnormal results of thyroid function studies: Secondary | ICD-10-CM | POA: Diagnosis present

## 2015-11-09 DIAGNOSIS — D472 Monoclonal gammopathy: Secondary | ICD-10-CM | POA: Diagnosis present

## 2015-11-09 DIAGNOSIS — R531 Weakness: Secondary | ICD-10-CM | POA: Diagnosis not present

## 2015-11-09 DIAGNOSIS — J690 Pneumonitis due to inhalation of food and vomit: Secondary | ICD-10-CM | POA: Diagnosis not present

## 2015-11-09 DIAGNOSIS — F0391 Unspecified dementia with behavioral disturbance: Secondary | ICD-10-CM | POA: Diagnosis present

## 2015-11-09 DIAGNOSIS — Z7982 Long term (current) use of aspirin: Secondary | ICD-10-CM | POA: Diagnosis not present

## 2015-11-09 DIAGNOSIS — J9601 Acute respiratory failure with hypoxia: Secondary | ICD-10-CM

## 2015-11-09 DIAGNOSIS — I272 Other secondary pulmonary hypertension: Secondary | ICD-10-CM | POA: Diagnosis present

## 2015-11-09 DIAGNOSIS — N183 Chronic kidney disease, stage 3 (moderate): Secondary | ICD-10-CM | POA: Diagnosis present

## 2015-11-09 DIAGNOSIS — D649 Anemia, unspecified: Secondary | ICD-10-CM | POA: Diagnosis present

## 2015-11-09 DIAGNOSIS — I5030 Unspecified diastolic (congestive) heart failure: Secondary | ICD-10-CM | POA: Diagnosis present

## 2015-11-09 DIAGNOSIS — E785 Hyperlipidemia, unspecified: Secondary | ICD-10-CM | POA: Diagnosis present

## 2015-11-09 DIAGNOSIS — E860 Dehydration: Secondary | ICD-10-CM | POA: Diagnosis present

## 2015-11-09 LAB — BLOOD GAS, ARTERIAL
ACID-BASE EXCESS: 1 mmol/L (ref 0.0–2.0)
Acid-Base Excess: 1.5 mmol/L (ref 0.0–2.0)
Bicarbonate: 24.2 mEq/L — ABNORMAL HIGH (ref 20.0–24.0)
Bicarbonate: 24.4 mEq/L — ABNORMAL HIGH (ref 20.0–24.0)
DRAWN BY: 345601
Drawn by: 295031
FIO2: 1
FIO2: 0.55
O2 CONTENT: 14 L/min
O2 Saturation: 79.5 %
O2 Saturation: 88.7 %
PCO2 ART: 32.3 mmHg — AB (ref 35.0–45.0)
PH ART: 7.486 — AB (ref 7.350–7.450)
Patient temperature: 98.6
Patient temperature: 98.6
TCO2: 22.2 mmol/L (ref 0–100)
TCO2: 22.8 mmol/L (ref 0–100)
pCO2 arterial: 36.1 mmHg (ref 35.0–45.0)
pH, Arterial: 7.445 (ref 7.350–7.450)
pO2, Arterial: 45.8 mmHg — ABNORMAL LOW (ref 80.0–100.0)
pO2, Arterial: 60.2 mmHg — ABNORMAL LOW (ref 80.0–100.0)

## 2015-11-09 LAB — COMPREHENSIVE METABOLIC PANEL
ALK PHOS: 90 U/L (ref 38–126)
ALT: 47 U/L (ref 17–63)
AST: 33 U/L (ref 15–41)
Albumin: 3.2 g/dL — ABNORMAL LOW (ref 3.5–5.0)
Anion gap: 8 (ref 5–15)
BILIRUBIN TOTAL: 0.9 mg/dL (ref 0.3–1.2)
BUN: 28 mg/dL — AB (ref 6–20)
CALCIUM: 9.6 mg/dL (ref 8.9–10.3)
CO2: 27 mmol/L (ref 22–32)
CREATININE: 1.06 mg/dL (ref 0.61–1.24)
Chloride: 108 mmol/L (ref 101–111)
GLUCOSE: 107 mg/dL — AB (ref 65–99)
Potassium: 4.2 mmol/L (ref 3.5–5.1)
Sodium: 143 mmol/L (ref 135–145)
TOTAL PROTEIN: 7.2 g/dL (ref 6.5–8.1)

## 2015-11-09 LAB — GLUCOSE, CAPILLARY: GLUCOSE-CAPILLARY: 103 mg/dL — AB (ref 65–99)

## 2015-11-09 LAB — CBC
HEMATOCRIT: 33.3 % — AB (ref 39.0–52.0)
HEMOGLOBIN: 10.4 g/dL — AB (ref 13.0–17.0)
MCH: 26.4 pg (ref 26.0–34.0)
MCHC: 31.2 g/dL (ref 30.0–36.0)
MCV: 84.5 fL (ref 78.0–100.0)
Platelets: 179 10*3/uL (ref 150–400)
RBC: 3.94 MIL/uL — AB (ref 4.22–5.81)
RDW: 13.9 % (ref 11.5–15.5)
WBC: 5.6 10*3/uL (ref 4.0–10.5)

## 2015-11-09 LAB — T4, FREE: Free T4: 5.24 ng/dL — ABNORMAL HIGH (ref 0.61–1.12)

## 2015-11-09 LAB — MRSA PCR SCREENING: MRSA by PCR: NEGATIVE

## 2015-11-09 MED ORDER — VITAMINS A & D EX OINT
TOPICAL_OINTMENT | CUTANEOUS | Status: AC
  Filled 2015-11-09: qty 5

## 2015-11-09 MED ORDER — FUROSEMIDE 10 MG/ML IJ SOLN
20.0000 mg | Freq: Once | INTRAMUSCULAR | Status: AC
  Administered 2015-11-09: 20 mg via INTRAVENOUS
  Filled 2015-11-09: qty 2

## 2015-11-09 MED ORDER — VANCOMYCIN HCL IN DEXTROSE 1-5 GM/200ML-% IV SOLN
1000.0000 mg | INTRAVENOUS | Status: DC
  Administered 2015-11-09 – 2015-11-12 (×4): 1000 mg via INTRAVENOUS
  Filled 2015-11-09 (×5): qty 200

## 2015-11-09 MED ORDER — CETYLPYRIDINIUM CHLORIDE 0.05 % MT LIQD
7.0000 mL | Freq: Two times a day (BID) | OROMUCOSAL | Status: DC
Start: 2015-11-09 — End: 2015-11-12
  Administered 2015-11-09 – 2015-11-12 (×7): 7 mL via OROMUCOSAL

## 2015-11-09 MED ORDER — FUROSEMIDE 20 MG PO TABS: 20.0000 mg | ORAL_TABLET | Freq: Once | ORAL | Status: DC

## 2015-11-09 NOTE — Progress Notes (Signed)
This RN was told in report that previous RN had placed patient on 2L/[place] early this AM after she noticed his O2 Sats had dropped while sleeping. At beginning of this shift, O2 Sats remained between 89-94% 2L/[place]. Patient was alert and answered questions with "Yes, ma'am." Around 8:30 AM, patient requested something to drink, and was assisted by RN and his daughter with nectar thickened liquids. Patient tolerated small sips well with no coughing. Patient's O2 Sats did drop to 76% 2L/[place] while drinking, however. Patient also became more lethargic but denied any pain or SOB. MD was paged to be made aware with no response. Patient was noted to have increased respirations also, and [place] oxygen was increased to 6L with O2 Sats only improving to 81-83% 6L/. Oxygen 12L via Venturi mask was placed with O2 Sats still remaining on lower 80s. MD was again paged and stated she would come assess patient. Rapid response was called and MD arrived to assess patient after 100% oxygen via non-rebreather was applied. O2 Sats slowly increased to 100% with 100% O2 via non-rebreather mask. Patient also became more alert. Order received and patient was transferred to bed 1239. Report given to RN Abla. Patient alert and stable upon transfer.

## 2015-11-09 NOTE — Progress Notes (Signed)
PT Cancellation Note  Patient Details Name: Bradley [name] MRN: [id number] DOB: October 17, 1941   Cancelled Treatment:    Reason Eval/Treat Not Completed: Patient not medically ready; pt was on tele--just transferred to ICU; will attempt again next day or as schedule permits.   [facility] 11/09/2015, 10:21 AM

## 2015-11-09 NOTE — Progress Notes (Signed)
ANTIBIOTIC CONSULT NOTE - INITIAL  Pharmacy Consult for Vancomycin Indication: rule out pneumonia  No Known Allergies  Patient Measurements: Height: 5\' 5"  (165.1 cm) Weight: 109 lb 5.6 oz (49.6 kg) IBW/kg (Calculated) : 61.5  Vital Signs: Temp: 99.3 F (37.4 C) (11/11 0513) Temp Source: Axillary (11/11 0513) BP: 142/76 mmHg (11/11 0855) Pulse Rate: 88 (11/11 0513) Intake/Output from previous day: 11/10 0701 - 11/11 0700 In: 150 [IV Piggyback:150] Out: 850 [Urine:850]  Labs:  Recent Labs  11/07/15 1143 11/08/15 0515  WBC 3.9*  --   HGB 9.9*  --   PLT 145*  --   CREATININE 0.85 0.79   Estimated Creatinine Clearance: 57.7 mL/min (by C-G formula based on Cr of 0.79). No results for input(s): VANCOTROUGH, VANCOPEAK, VANCORANDOM, GENTTROUGH, GENTPEAK, GENTRANDOM, TOBRATROUGH, TOBRAPEAK, TOBRARND, AMIKACINPEAK, AMIKACINTROU, AMIKACIN in the last 72 hours.   Microbiology: Recent Results (from the past 720 hour(s))  Urine culture     Status: None   Collection Time: 11/02/15  9:21 PM  Result Value Ref Range Status   Specimen Description URINE, CATHETERIZED  Final   Special Requests Normal  Final   Culture MULTIPLE SPECIES PRESENT, SUGGEST RECOLLECTION  Final   Report Status 11/04/2015 FINAL  Final    Medical History: Past Medical History  Diagnosis Date  . Stroke ([place])   . Hypertension   . Hyperlipidemia   . Aortic stenosis   . CHF (congestive heart failure) ([place])   . MGUS (monoclonal gammopathy of unknown significance)   . Goiter   . Rhabdomyolysis   . UTI (urinary tract infection) 05/09/2014  . Dementia     Medications:  Anti-infectives    Start     Dose/Rate Route Frequency Ordered Stop   11/08/15 1330  piperacillin-tazobactam (ZOSYN) IVPB 3.375 g     3.375 g 12.5 mL/hr over 240 Minutes Intravenous 3 times per day 11/08/15 1253       Assessment: 30 yoM admitted 11/9 with generalized weakness.  CXR (11/10) with worsening airspace process over the and  posterior mid to lower lungs (R>L) suggesting infection.  Pharmacy consulted to dose Zosyn for possible aspiration pneumonia.  Today, adding vancomycin.  Transferring to SDU.  11/10 >> Zosyn >> 11/11 >> Vancomycin >>  Today, 11/09/2015: Tmax/24h: 99.3 WBC low on admission, 3.9 (11/9) SCr 0.8, CrCl~56 ml/min  Goal of Therapy:  Appropriate abx dosing, eradication of infection.   Plan:  Vancomycin 1g IV q24h. Continue Zosyn 3.375g IV Q8H infused over 4hrs. Follow up renal fxn, culture results, and clinical course.  Hershal Coria, PharmD, BCPS Pager: [phone] 11/09/2015 9:22 AM

## 2015-11-09 NOTE — Progress Notes (Signed)
[facility] PROGRESS NOTE  JOANTHAN [name] [id number] DOB: 07/27/1941 DOA: 11/07/2015 PCP: [name], MD Interim summary: Bradley [name] is a 74 y.o. male with h/o hypertension, hyperlipidemia, stroke, MGUS, dementia was brought in by family members for worsening generalized weakness. CXR on admission showed bilateral infiltrates improved from prior exam. Overnight pt apparently had some water with a straw by mistake, and family was concerned.  He is not hypoxic, no fever and but he was found to be tachypnic and repeat CXR was done showed worsening air space disease probably infectious etiology. He was started on zosyn for presumed aspiration pneumonia.  Around 8:30 am, on 11/11, pt was found to be slumped on one side and tachypnic and RN, checked his vitals and he was foundt o be hypoxic requiring 100% oxygen. RRT called.    Assessment/Plan: Acute respiratory failure with hypoxia  Probably from aspiration pneumonia: transfer to step down and on venti mask to keep sats greater than 90%,  - added vancomycin to zosyn.  -  will benefit from SLP eval, ordered today, as per the daughter at bedside, he was coughing with thickened liquids.  -  ABG shows PH 7.4, pco2,32.3, po2 45.8,.  - EKG 12 lead ordered.  - CXR repeated today showed widespread airspace consolidation. Small effusions with cardiomegaly and pulm venous hypertension suggesting CHF.  - repeat labs ordered.  - small dose of lasix 20 mg IV given.     Generalized weakness: PT eval for deconditioning.   Hypernatremia: - resolved with fluids. 1/2 NS fluids stopped on the morning of 11/10  Anemia: normocytic,  Stable.  Repeat in am.   Elevated troponin: no chest pain, cardiology consulted by [name], recommended Further work up warranted   Abnormal TSH: Free t3 and free t4 ordered.    Code Status: full code.  Family Communication: daughter at bedside. Disposition Plan: pending.     Consultants:  pCCM.  Procedures:  none  Antibiotics:  Zosyn 11/10  Vancomycin 11/11  HPI/Subjective: Reports feeling sob.  Objective: Filed Vitals:   11/09/15 1000  BP: 155/72  Pulse: 79  Temp:   Resp: 31    Intake/Output Summary (Last 24 hours) at 11/09/15 1136 Last data filed at 11/09/15 1103  Gross per 24 hour  Intake    350 ml  Output   1050 ml  Net   -700 ml   Filed Weights   11/09/15 0500 11/09/15 0513 11/09/15 0940  Weight: 49.7 kg (109 lb 9.1 oz) 49.6 kg (109 lb 5.6 oz) 50.2 kg (110 lb 10.7 oz)    Exam:   General:  Alert confused  Cardiovascular: s1s2  Respiratory: scattered rhonchi on the right   Abdomen: soft NT nd bs+  Musculoskeletal: no pedal edema.   Data Reviewed: Basic Metabolic Panel:  Recent Labs Lab 11/02/15 1820 11/07/15 1143 11/08/15 0515  NA 144 146* 145  K 4.2 3.6 3.7  CL 113* 112* 111  CO2  --  25 28  GLUCOSE 109* 108* 112*  BUN 22* 21* 20  CREATININE 1.20 0.85 0.79  CALCIUM  --  9.9 9.9   Liver Function Tests: No results for input(s): AST, ALT, ALKPHOS, BILITOT, PROT, ALBUMIN in the last 168 hours. No results for input(s): LIPASE, AMYLASE in the last 168 hours. No results for input(s): AMMONIA in the last 168 hours. CBC:  Recent Labs Lab 11/02/15 1820 11/07/15 1143 11/09/15 1045  WBC  --  3.9* 5.6  HGB 11.2* 9.9* 10.4*  HCT 33.0*  30.9* 33.3*  MCV  --  84.9 84.5  PLT  --  145* 179   Cardiac Enzymes:  Recent Labs Lab 11/07/15 1143  TROPONINI 0.09*   BNP (last 3 results)  Recent Labs  04/02/15 1533  BNP 2635.5*    ProBNP (last 3 results)  Recent Labs  11/16/14 1842 12/11/14 1448 05/24/15 0906  PROBNP 5173.0* 1169.0* 789.0*    CBG: No results for input(s): GLUCAP in the last 168 hours.  Recent Results (from the past 240 hour(s))  Urine culture     Status: None   Collection Time: 11/02/15  9:21 PM  Result Value Ref Range Status   Specimen Description URINE, CATHETERIZED   Final   Special Requests Normal  Final   Culture MULTIPLE SPECIES PRESENT, SUGGEST RECOLLECTION  Final   Report Status 11/04/2015 FINAL  Final     Studies: Dg Chest 2 View  11/08/2015  CLINICAL DATA:  Aspiration pneumonia. EXAM: CHEST  2 VIEW COMPARISON:  11/07/2015. FINDINGS: Lungs are adequately inflated with worsening airspace opacification over the posterior mid to lower lungs right greater than left likely infection. Cannot exclude a small amount of posterior pleural fluid. Mild stable cardiomegaly. Calcified plaque is present over the aortic arch. Remainder of the exam is unchanged. IMPRESSION: Worsening airspace process over the and posterior mid to lower lungs right greater than left suggesting infection. Cannot exclude a small amount of pleural fluid. Electronically Signed   By: Marin Olp M.D.   On: 11/08/2015 11:37   Dg Chest Port 1 View  11/09/2015  CLINICAL DATA:  Shortness of Breath EXAM: PORTABLE CHEST 1 VIEW COMPARISON:  November 08, 2015 FINDINGS: Extensive airspace consolidation remains throughout both mid and lower lung zones. There are small pleural effusions bilaterally. Heart is enlarged with mild pulmonary venous hypertension. There is atherosclerotic calcification in aorta. No adenopathy. No pneumothorax. IMPRESSION: Widespread airspace consolidation persists. Small effusions with cardiomegaly and pulmonary venous hypertension suggest a degree of underlying congestive heart failure. Follow these changes could be due to congestive heart failure. The appearance of the lungs does suggests that there is superimposed pneumonia. Electronically Signed   By: Lowella Grip III M.D.   On: 11/09/2015 09:16    Scheduled Meds: . amLODipine  10 mg Oral Daily  . aspirin  81 mg Oral Daily  . atorvastatin  10 mg Oral Daily  . enoxaparin (LOVENOX) injection  40 mg Subcutaneous Q24H  . hydrALAZINE  100 mg Oral Daily  . hydrALAZINE  50 mg Oral QHS  . labetalol  300 mg Oral BID  .  piperacillin-tazobactam (ZOSYN)  IV  3.375 g Intravenous 3 times per day  . vancomycin  1,000 mg Intravenous Q24H   Continuous Infusions:   Principal Problem:   UTI (lower urinary tract infection) Active Problems:   Diastolic dysfunction, left ventricle   Hypertension   Chronic renal insufficiency, stage III (moderate)   Anemia-MGUS   Demand ischemia (HCC)   Dyslipidemia   Severe aortic stenosis   H/O: CVA  with Lt hemiparesis   Goiter   Weakness   Aspiration pneumonia ([place])    Time spent:25 minutes    Mersedes Alber  [facility] Pager [phone]  If 7PM-7AM, please contact night-coverage at [email or web address], password [facility] 11/09/2015, 11:36 AM  LOS: 0 days

## 2015-11-09 NOTE — Consult Note (Signed)
Name: Bradley [name] MRN: [id number] DOB: [birth year]    ADMISSION DATE:  11/07/2015 CONSULTATION DATE:  11/09/15  REFERRING MD :  Hosie Poisson   CHIEF COMPLAINT:  respiratory distress   HPI: [name] is a 74 yo M with h/o HTN, HLD, CVA, MGUS, dementia was brought in by family members on 11/09 for worsening generalized weakness. On admission, CXR showing bilateral infiltrating infiltrates that were improved from CXR in April 2016. On 11/10, patient apparently had some water with a straw by mistake, supposed to be on strict mechanical soft diet with nectar consistency. He was found to be tachypneic but not hypoxic, and repeat CXR was showed worsening air space disease probably infectious etiology. He was started on zosyn for presumed aspiration pneumonia.   SIGNIFICANT EVENTS  11/10 witness aspiration event with water   STUDIES:  11/2014 echo >>nml EF, critical AS - 0.9 cm2 11/11 CXR: personally reviewed  11/10 CXR: personally reviewed   PAST MEDICAL HISTORY :   has a past medical history of Stroke [facility]); Hypertension; Hyperlipidemia; Aortic stenosis; CHF (congestive heart failure) ([place]); MGUS (monoclonal gammopathy of unknown significance); Goiter; Rhabdomyolysis; UTI (urinary tract infection) (05/09/2014); and Dementia.  has past surgical history that includes Circumcision. Prior to Admission medications   Medication Sig Start Date End Date Taking? Authorizing Provider  ALPRAZolam Duanne Moron) 0.25 MG tablet Take 0.5-1 tablets by mouth daily as needed for anxiety.  09/11/15  Yes Historical Provider, MD  amLODipine (NORVASC) 10 MG tablet Take 10 mg by mouth daily.   Yes Historical Provider, MD  aspirin 81 MG tablet Take 81 mg by mouth daily.   Yes Historical Provider, MD  atorvastatin (LIPITOR) 10 MG tablet Take 10 mg by mouth daily.   Yes Historical Provider, MD  cephALEXin (KEFLEX) 500 MG capsule Take 1 capsule (500 mg total) by mouth 4 (four) times daily. Take all of medicine and drink lots  of fluids Patient taking differently: Take 500 mg by mouth 3 (three) times daily. Take all of medicine and drink lots of fluids 11/02/15  Yes Billy Fischer, MD  furosemide (LASIX) 40 MG tablet Take 40 mg by mouth daily. 04/28/15  Yes Historical Provider, MD  hydrALAZINE (APRESOLINE) 100 MG tablet Take 50-100 mg by mouth 2 (two) times daily. Take 100 mg every morning and 50 mg every night. 06/14/15  Yes Historical Provider, MD  labetalol (NORMODYNE) 300 MG tablet Take 1 tablet (300 mg total) by mouth 2 (two) times daily. 05/09/14  Yes [name] [place], PA-C  pramoxine (PROCTOFOAM) 1 % foam Place 1 application rectally 3 (three) times daily as needed for itching. 06/19/15  Yes Dorie Rank, MD  albuterol (PROVENTIL) (2.5 MG/3ML) 0.083% nebulizer solution Take 3 mLs (2.5 mg total) by nebulization every 4 (four) hours as needed for wheezing or shortness of breath. Patient not taking: Reported on 11/07/2015 04/02/15   Jonetta Osgood, MD  docusate sodium (COLACE) 100 MG capsule Take 1 capsule (100 mg total) by mouth every 12 (twelve) hours. Patient not taking: Reported on 09/10/2015 06/19/15   Dorie Rank, MD  ipratropium-albuterol (DUONEB) 0.5-2.5 (3) MG/3ML SOLN Take 3 mLs by nebulization 2 (two) times daily. Patient not taking: Reported on 06/19/2015 04/05/15   Jonetta Osgood, MD  potassium chloride SA (K-DUR,KLOR-CON) 20 MEQ tablet Take 1 tablet (20 mEq total) by mouth daily. Patient not taking: Reported on 06/19/2015 04/05/15   Jonetta Osgood, MD   No Known Allergies  FAMILY HISTORY:  Family history is  unknown by patient. SOCIAL HISTORY:  reports that he has never smoked. He has never used smokeless tobacco. He reports that he does not drink alcohol or use illicit drugs.  REVIEW OF SYSTEMS:   Constitutional: Negative for fever, chills, weight loss, malaise/fatigue and diaphoresis.  HENT: Negative for hearing loss, ear pain, nosebleeds, congestion, sore throat, neck pain, tinnitus and ear discharge.     Eyes: Negative for blurred vision, double vision, photophobia, pain, discharge and redness.  Respiratory: Negative for cough, hemoptysis, sputum production, shortness of breath, wheezing and stridor.   Cardiovascular: Negative for chest pain, palpitations, orthopnea, claudication, leg swelling and PND.  Gastrointestinal: Negative for heartburn, nausea, vomiting, abdominal pain, diarrhea, constipation, blood in stool and melena.  Genitourinary: Negative for dysuria, urgency, frequency, hematuria and flank pain.  Musculoskeletal: Negative for myalgias, back pain, joint pain and falls.  Skin: Negative for itching and rash.  Neurological: Negative for dizziness, tingling, tremors, sensory change, speech change, focal weakness, seizures, loss of consciousness, weakness and headaches.  Endo/Heme/Allergies: Negative for environmental allergies and polydipsia. Does not bruise/bleed easily.  SUBJECTIVE:   VITAL SIGNS: Temp:  [98.8 F (37.1 C)-99.5 F (37.5 C)] 99.3 F (37.4 C) (11/11 0513) Pulse Rate:  [73-88] 79 (11/11 1000) Resp:  [18-38] 31 (11/11 1000) BP: (120-155)/(64-78) 155/72 mmHg (11/11 1000) SpO2:  [91 %-100 %] 100 % (11/11 1000) Weight:  [109 lb 5.6 oz (49.6 kg)-110 lb 10.7 oz (50.2 kg)] 110 lb 10.7 oz (50.2 kg) (11/11 0940)  PHYSICAL EXAMINATION: General:  Chronically-ill appearing man lying in hospital bed  Neuro:  A&Ox3 HEENT:  EOMI, MMM, NRB well placed over nose  Cardiovascular:  RRR, Grade 2/6 crescendo decrescendo holosystolic murmur best heard at the right sternal border, not radiating Lungs:  Normal work of breathing on 2L by NRB, rhonchi appreciated in all lung fields during expiration, decreased breath sounds in right lung base  Abdomen:  NABS, NT/ND   Recent Labs Lab 11/02/15 1820 11/07/15 1143 11/08/15 0515  NA 144 146* 145  K 4.2 3.6 3.7  CL 113* 112* 111  CO2  --  25 28  BUN 22* 21* 20  CREATININE 1.20 0.85 0.79  GLUCOSE 109* 108* 112*    Recent  Labs Lab 11/02/15 1820 11/07/15 1143  HGB 11.2* 9.9*  HCT 33.0* 30.9*  WBC  --  3.9*  PLT  --  145*   Dg Chest 2 View  11/08/2015  CLINICAL DATA:  Aspiration pneumonia. EXAM: CHEST  2 VIEW COMPARISON:  11/07/2015. FINDINGS: Lungs are adequately inflated with worsening airspace opacification over the posterior mid to lower lungs right greater than left likely infection. Cannot exclude a small amount of posterior pleural fluid. Mild stable cardiomegaly. Calcified plaque is present over the aortic arch. Remainder of the exam is unchanged. IMPRESSION: Worsening airspace process over the and posterior mid to lower lungs right greater than left suggesting infection. Cannot exclude a small amount of pleural fluid. Electronically Signed   By: Marin Olp M.D.   On: 11/08/2015 11:37   Dg Chest 2 View  11/07/2015  CLINICAL DATA:  Weakness.  Dementia. EXAM: CHEST  2 VIEW COMPARISON:  04/04/2015. FINDINGS: Mediastinum and hilar structures are normal. Cardiomegaly. Improving bilateral pulmonary alveolar infiltrates are noted. No pleural effusion or pneumothorax. No acute bony abnormality. Degenerative changes both shoulders. Exam IMPRESSION: 1. Bilateral pulmonary alveolar infiltrates, improved from prior exam . 2. Stable cardiomegaly. Electronically Signed   By: Marcello Moores  Register   On: 11/07/2015 12:07   Dg  Chest Port 1 View  11/09/2015  CLINICAL DATA:  Shortness of Breath EXAM: PORTABLE CHEST 1 VIEW COMPARISON:  November 08, 2015 FINDINGS: Extensive airspace consolidation remains throughout both mid and lower lung zones. There are small pleural effusions bilaterally. Heart is enlarged with mild pulmonary venous hypertension. There is atherosclerotic calcification in aorta. No adenopathy. No pneumothorax. IMPRESSION: Widespread airspace consolidation persists. Small effusions with cardiomegaly and pulmonary venous hypertension suggest a degree of underlying congestive heart failure. Follow these changes could  be due to congestive heart failure. The appearance of the lungs does suggests that there is superimposed pneumonia. Electronically Signed   By: Lowella Grip III M.D.   On: 11/09/2015 09:16    ASSESSMENT / PLAN:  Patient is in acute hypoxic respiratory failure, secondary to pneumonia, most likely aspirational pneumonia given witnessed event and time course.   Recommend continued vancomycin and zosyn to cover for typical aspirated flora, oral gram+ flora and anaerobes  Decrease FiO2-can change to Ventimask  Swallow evaluation to clarify aspiration risk and change diet accordingly- per daughter, he only takes nectar thick liquids at home Agree with care limitations  [facility] M available as needed  Rigoberto Noel. MD   11/09/2015, 10:46 AM

## 2015-11-09 NOTE — Progress Notes (Signed)
Patient have had over 7 formed stools in the last 24hrs. sent a message to on call practitioner  L. [place]uk-NP no call back and no new order.

## 2015-11-09 NOTE — Evaluation (Signed)
Clinical/Bedside Swallow Evaluation Patient Details  Name: RORRY TAWES MRN: [id number] Date of Birth: [birth year]  Today's Date: 11/09/2015 Time: SLP Start Time (ACUTE ONLY): 1440 SLP Stop Time (ACUTE ONLY): 1500 SLP Time Calculation (min) (ACUTE ONLY): 20 min  Past Medical History:  Past Medical History  Diagnosis Date  . Stroke ([place])   . Hypertension   . Hyperlipidemia   . Aortic stenosis   . CHF (congestive heart failure) ([place])   . MGUS (monoclonal gammopathy of unknown significance)   . Goiter   . Rhabdomyolysis   . UTI (urinary tract infection) 05/09/2014  . Dementia    Past Surgical History:  Past Surgical History  Procedure Laterality Date  . Circumcision     HPI:  COLBY ROGAN is a 74 y.o. male with h/o hypertension, hyperlipidemia, stroke, MGUS, dementia was brought in by family members for worsening generalized weakness. CXR on admission showed bilateral infiltrates improved from prior exam. Overnight pt apparently had some water with a straw by mistake, and family was concerned.    Assessment / Plan / Recommendation Clinical Impression  Pt presents with a baseline dysphagia further compromised by increased respiratory demand. Pt on non-rebreather at time of assessment, RR fluctuating from low 30s to mid 40s with PO trials. One sip of nectar thick liquids resulted in relatively timely swallow, though delayed cough concerning for airway penetration. Trialed pudding with no significant impact or sign of aspiration. Recommend pt remain NPO until respiratory function has improved (pt off non-rebreather and RR stable below 30). Pt may take necessary medication crushed in puree. SLP will check with RN over the weekend for potential readiness.     Aspiration Risk  Severe aspiration risk    Diet Recommendation     Medication Administration: Crushed with puree    Other  Recommendations Oral Care Recommendations: Oral care QID   Follow up Recommendations  24 hour  supervision/assistance    Frequency and Duration min 2x/week  2 weeks       Swallow Study   General HPI: AVNEESH SALONGA is a 74 y.o. male with h/o hypertension, hyperlipidemia, stroke, MGUS, dementia was brought in by family members for worsening generalized weakness. CXR on admission showed bilateral infiltrates improved from prior exam. Overnight pt apparently had some water with a straw by mistake, and family was concerned.  Type of Study: Bedside Swallow Evaluation Previous Swallow Assessment: see HPI Diet Prior to this Study: NPO Temperature Spikes Noted: Yes Respiratory Status: Non-rebreather History of Recent Intubation: No Behavior/Cognition: Alert;Cooperative Oral Cavity Assessment: Within Functional Limits Oral Care Completed by SLP: No Vision: Functional for self-feeding Self-Feeding Abilities: Needs assist Patient Positioning: Upright in bed Baseline Vocal Quality: Low vocal intensity Volitional Cough: Strong Volitional Swallow: Able to elicit    Oral/Motor/Sensory Function Overall Oral Motor/Sensory Function: Within functional limits   Ice Chips Ice chips: Not tested   Thin Liquid Thin Liquid: Not tested    Nectar Thick Nectar Thick Liquid: Impaired Presentation: Cup;Self Fed Pharyngeal Phase Impairments: Cough - Delayed   Honey Thick Honey Thick Liquid: Not tested   Puree Puree: Within functional limits   Solid Solid: Not tested      Herbie Baltimore, MA CCC-SLP [id number]  Lynann Beaver 11/09/2015,3:24 PM

## 2015-11-10 LAB — BASIC METABOLIC PANEL
ANION GAP: 9 (ref 5–15)
BUN: 32 mg/dL — ABNORMAL HIGH (ref 6–20)
CO2: 27 mmol/L (ref 22–32)
Calcium: 9.8 mg/dL (ref 8.9–10.3)
Chloride: 109 mmol/L (ref 101–111)
Creatinine, Ser: 1.03 mg/dL (ref 0.61–1.24)
GFR calc Af Amer: >60 mL/min (ref >60)
GLUCOSE: 98 mg/dL (ref 65–99)
POTASSIUM: 4 mmol/L (ref 3.5–5.1)
Sodium: 145 mmol/L (ref 135–145)

## 2015-11-10 LAB — CBC
HEMATOCRIT: 30.7 % — AB (ref 39.0–52.0)
HEMOGLOBIN: 9.5 g/dL — AB (ref 13.0–17.0)
MCH: 25.7 pg — ABNORMAL LOW (ref 26.0–34.0)
MCHC: 30.9 g/dL (ref 30.0–36.0)
MCV: 83.2 fL (ref 78.0–100.0)
PLATELETS: 171 10*3/uL (ref 150–400)
RBC: 3.69 MIL/uL — AB (ref 4.22–5.81)
RDW: 13.4 % (ref 11.5–15.5)
WBC: 4.4 10*3/uL (ref 4.0–10.5)

## 2015-11-10 LAB — T3, FREE: T3, Free: 12.6 pg/mL — ABNORMAL HIGH (ref 2.0–4.4)

## 2015-11-10 NOTE — Progress Notes (Signed)
[facility] PROGRESS NOTE  Bradley [name] [id number] DOB: 11-04-1941 DOA: 11/07/2015 PCP: [name], MD Interim summary: Bradley [name] is a 74 y.o. male with h/o hypertension, hyperlipidemia, stroke, MGUS, dementia was brought in by family members for worsening generalized weakness. CXR on admission showed bilateral infiltrates improved from prior exam. Overnight pt apparently had some water with a straw by mistake, and family was concerned.  He is not hypoxic, no fever and but he was found to be tachypnic and repeat CXR was done showed worsening air space disease probably infectious etiology. He was started on zosyn for presumed aspiration pneumonia.  Around 8:30 am, on 11/11, pt was found to be slumped on one side and tachypnic and RN, checked his vitals and he was foundt o be hypoxic requiring 100% oxygen. RRT called. He was transferred to step down and he remains on 50 % venti mask with sats in upper 90%'s.    Assessment/Plan: Acute respiratory failure with hypoxia  Probably from aspiration pneumonia: transferred to step down on 11/11 and on venti mask to keep sats greater than 90%,  - added vancomycin to zosyn.  -  will benefit from SLP eval, ordered today, as per the daughter at bedside, he was coughing with thickened liquids. SLP eval recommended medication adm crushed with puree.  -  ABG shows PH 7.445, pco2,36.1, po2 60.2, bicarbonate 24.4.  - CXR repeated today showed widespread airspace consolidation. Small effusions with cardiomegaly and pulm venous hypertension suggesting CHF.  - ordered a small dose of lasix, repeat CXR ordered.  - he is clinically improving, tachypnea resolved. His oxygen sats have improved, plan to wean him down and get repeat SLP eval.     Generalized weakness: PT eval for deconditioning.   Hypernatremia: - resolved with fluids. 1/2 NS fluids stopped on the morning of 11/10  Anemia: normocytic,  Stable.  Repeat in am stable.   Elevated  troponin: no chest pain, cardiology consulted by [name], recommended Further work up warranted   Abnormal TSH: Free t3 and free t4 ordered and are found to be elevated. He is not on any thyroid supplements.    Code Status: full code.  Family Communication: daughter at bedside. Disposition Plan: pending resolution of pneumonia and PT eval.    Consultants:  pCCM.  Procedures:  none  Antibiotics:  Zosyn 11/10  Vancomycin 11/11  HPI/Subjective: Reports feeling sob.  Objective: Filed Vitals:   11/10/15 0800  BP:   Pulse:   Temp: 98.1 F (36.7 C)  Resp:     Intake/Output Summary (Last 24 hours) at 11/10/15 1054 Last data filed at 11/09/15 2216  Gross per 24 hour  Intake    300 ml  Output   1065 ml  Net   -765 ml   Filed Weights   11/09/15 0513 11/09/15 0940 11/10/15 0332  Weight: 49.6 kg (109 lb 5.6 oz) 50.2 kg (110 lb 10.7 oz) 53.7 kg (118 lb 6.2 oz)    Exam:   General:  Alert confused  Cardiovascular: s1s2  Respiratory: scattered rhonchi on the right   Abdomen: soft NT nd bs+  Musculoskeletal: no pedal edema.   Data Reviewed: Basic Metabolic Panel:  Recent Labs Lab 11/07/15 1143 11/08/15 0515 11/09/15 1045 11/10/15 0400  NA 146* 145 143 145  K 3.6 3.7 4.2 4.0  CL 112* 111 108 109  CO2 25 28 27 27   GLUCOSE 108* 112* 107* 98  BUN 21* 20 28* 32*  CREATININE 0.85 0.79 1.06 1.03  CALCIUM 9.9 9.9 9.6 9.8   Liver Function Tests:  Recent Labs Lab 11/09/15 1045  AST 33  ALT 47  ALKPHOS 90  BILITOT 0.9  PROT 7.2  ALBUMIN 3.2*   No results for input(s): LIPASE, AMYLASE in the last 168 hours. No results for input(s): AMMONIA in the last 168 hours. CBC:  Recent Labs Lab 11/07/15 1143 11/09/15 1045 11/10/15 0400  WBC 3.9* 5.6 4.4  HGB 9.9* 10.4* 9.5*  HCT 30.9* 33.3* 30.7*  MCV 84.9 84.5 83.2  PLT 145* 179 171   Cardiac Enzymes:  Recent Labs Lab 11/07/15 1143  TROPONINI 0.09*   BNP (last 3 results)  Recent Labs   04/02/15 1533  BNP 2635.5*    ProBNP (last 3 results)  Recent Labs  11/16/14 1842 12/11/14 1448 05/24/15 0906  PROBNP 5173.0* 1169.0* 789.0*    CBG:  Recent Labs Lab 11/09/15 2231  GLUCAP 103*    Recent Results (from the past 240 hour(s))  Urine culture     Status: None   Collection Time: 11/02/15  9:21 PM  Result Value Ref Range Status   Specimen Description URINE, CATHETERIZED  Final   Special Requests Normal  Final   Culture MULTIPLE SPECIES PRESENT, SUGGEST RECOLLECTION  Final   Report Status 11/04/2015 FINAL  Final  MRSA PCR Screening     Status: None   Collection Time: 11/09/15  9:50 AM  Result Value Ref Range Status   MRSA by PCR NEGATIVE NEGATIVE Final    Comment:        The GeneXpert MRSA Assay (FDA approved for NASAL specimens only), is one component of a comprehensive MRSA colonization surveillance program. It is not intended to diagnose MRSA infection nor to guide or monitor treatment for MRSA infections.   Culture, blood (routine x 2)     Status: None (Preliminary result)   Collection Time: 11/09/15  6:40 PM  Result Value Ref Range Status   Specimen Description BLOOD RIGHT ARM  Final   Special Requests BOTTLES DRAWN AEROBIC AND ANAEROBIC  10CC  Final   Culture   Final    NO GROWTH < 12 HOURS Performed at [facility]    Report Status PENDING  Incomplete  Culture, blood (routine x 2)     Status: None (Preliminary result)   Collection Time: 11/09/15  6:45 PM  Result Value Ref Range Status   Specimen Description BLOOD RIGHT ARM  Final   Special Requests BOTTLES DRAWN AEROBIC AND ANAEROBIC  10CC  Final   Culture   Final    NO GROWTH < 12 HOURS Performed at [facility]    Report Status PENDING  Incomplete     Studies: Dg Chest Port 1 View  11/09/2015  CLINICAL DATA:  Shortness of Breath EXAM: PORTABLE CHEST 1 VIEW COMPARISON:  November 08, 2015 FINDINGS: Extensive airspace consolidation remains throughout both mid and lower  lung zones. There are small pleural effusions bilaterally. Heart is enlarged with mild pulmonary venous hypertension. There is atherosclerotic calcification in aorta. No adenopathy. No pneumothorax. IMPRESSION: Widespread airspace consolidation persists. Small effusions with cardiomegaly and pulmonary venous hypertension suggest a degree of underlying congestive heart failure. Follow these changes could be due to congestive heart failure. The appearance of the lungs does suggests that there is superimposed pneumonia. Electronically Signed   By: Lowella Grip III M.D.   On: 11/09/2015 09:16    Scheduled Meds: . amLODipine  10 mg Oral Daily  . antiseptic oral rinse  7 mL Mouth Rinse BID  . aspirin  81 mg Oral Daily  . atorvastatin  10 mg Oral Daily  . enoxaparin (LOVENOX) injection  40 mg Subcutaneous Q24H  . hydrALAZINE  100 mg Oral Daily  . hydrALAZINE  50 mg Oral QHS  . labetalol  300 mg Oral BID  . piperacillin-tazobactam (ZOSYN)  IV  3.375 g Intravenous 3 times per day  . vancomycin  1,000 mg Intravenous Q24H  . vitamin A & D       Continuous Infusions:   Principal Problem:   UTI (lower urinary tract infection) Active Problems:   Diastolic dysfunction, left ventricle   Hypertension   Chronic renal insufficiency, stage III (moderate)   Anemia-MGUS   Demand ischemia (HCC)   Dyslipidemia   Severe aortic stenosis   H/O: CVA  with Lt hemiparesis   Goiter   Weakness   Aspiration pneumonia ([place])    Time spent:25 minutes    Moses Odoherty  [facility] Pager [phone]  If 7PM-7AM, please contact night-coverage at [email or web address], password [facility] 11/10/2015, 10:54 AM  LOS: 1 day

## 2015-11-10 NOTE — Progress Notes (Signed)
Speech Language Pathology Treatment: Dysphagia  Patient Details Name: SAHR CHAPNICK MRN: [id number] DOB: [birth year] Today's Date: 11/10/2015 Time: [id number] SLP Time Calculation (min) (ACUTE ONLY): 19 min  Assessment / Plan / Recommendation Clinical Impression  Pt observed with honey-thickened liquids and puree consistencies given RR <30 throughout day and continued during swallow re-assessment with venti-mask utilized today at 50% vs NRB yesterday.  Pt consumed half tsp amounts of honey-thickened liquids and puree consistency with one incidence of delayed throat clearing after successive swallows of puree suggesting probable pharyngeal weakness.  Pt cued to utilize repetitive swallows and effortful swallow which eliminated delayed throat clearing.  Family/pt given extensive education re: aspiration risk without use of swallowing precautions/strategies and these listed with Dysphagia 1/honey-thickened liquids recommended for diet advancement.  Family was instructed to stop feeding if he exhibited any s/s of aspiration during feeding and to only feed when alert/not fatigued.  MBS recommended when pt can tolerate to assess swallowing objectively.     HPI HPI: GRANITE DOWSETT is a 74 y.o. male with h/o hypertension, hyperlipidemia, stroke, MGUS, dementia was brought in by family members for worsening generalized weakness. CXR on admission showed bilateral infiltrates improved from prior exam. Overnight pt apparently had some water with a straw by mistake, and family was concerned.       SLP Plan  Continue with current plan of care;MBS     Recommendations  Diet recommendations: Dysphagia 1 (puree);Honey-thick liquid Liquids provided via: Teaspoon Medication Administration: Crushed with puree Supervision: Full supervision/cueing for compensatory strategies Compensations: Slow rate;Small sips/bites;Multiple dry swallows after each bite/sip;Effortful swallow;Other (Comment) (feed only if alert/not  fatigued) Postural Changes and/or Swallow Maneuvers: Seated upright 90 degrees              Oral Care Recommendations: Oral care BID Follow up Recommendations: 24 hour supervision/assistance Plan: Continue with current plan of care;MBS   ADAMS,PAT, M.S., CCC-SLP 11/10/2015, 4:53 PM

## 2015-11-10 NOTE — Evaluation (Signed)
Physical Therapy Evaluation Patient Details Name: Bradley [name] MRN: [id number] DOB: 15-Feb-1941 Today's Date: 11/10/2015   History of Present Illness  Pt admitted from home with mental status changes and dx with UTI.  Pt with hx of CVA, CHF and dementia  Clinical Impression  Pt admitted as above and presenting with functional mobility limitations 2* generalized weakness, ltd endurance, and balance deficits.  Pt family plans dc home with 24/7 CNA care and intermittent family assist as well as HHPT continued from prior to admit.    Follow Up Recommendations Home health PT    Equipment Recommendations  None recommended by PT    Recommendations for Other Services OT consult     Precautions / Restrictions Precautions Precautions: Fall Restrictions Weight Bearing Restrictions: No      Mobility  Bed Mobility Overal bed mobility: Needs Assistance Bed Mobility: Supine to Sit     Supine to sit: Mod assist     General bed mobility comments: Pt initiated movement to EOB but mod assist required to correct for retropulsion and to bring pt to EOB on pad.  Pt in EOB sitting x 8 min with multimodal cues  and min to mod assist for balance  Transfers Overall transfer level: Needs assistance Equipment used: Rolling walker (2 wheeled) Transfers: Sit to/from Stand Sit to Stand: Mod assist;+2 physical assistance;+2 safety/equipment         General transfer comment: cues for transition position and use of UEs to self assist.  Physical assist to correct for retropulsion, to bring wt up and fwd and to balance in standing with RW  Ambulation/Gait Ambulation/Gait assistance: Mod assist;+2 physical assistance Ambulation Distance (Feet): 2 Feet Assistive device: Rolling walker (2 wheeled) Gait Pattern/deviations: Step-to pattern;Decreased step length - right;Decreased step length - left;Shuffle;Trunk flexed;Wide base of support     General Gait Details: cues for posture and position from RW.   Physical assist to balance, support and correction of retrolulsion  Stairs            Wheelchair Mobility    Modified Rankin (Stroke Patients Only)       Balance Overall balance assessment: Needs assistance Sitting-balance support: Bilateral upper extremity supported;Feet supported Sitting balance-Leahy Scale: Poor     Standing balance support: Bilateral upper extremity supported Standing balance-Leahy Scale: Poor                               Pertinent Vitals/Pain Pain Assessment: No/denies pain    Home Living Family/patient expects to be discharged to:: Private residence Living Arrangements: Children;Other (Comment) (CNAs round the clock per family member) Available Help at Discharge: Family Type of Home: Apartment Home Access: Stairs to enter Entrance Stairs-Rails: Right;Left;Can reach both Entrance Stairs-Number of Steps: [place]: One level Home Equipment: [place] - 2 wheels;Bedside commode      Prior Function Level of Independence: Needs assistance   Gait / Transfers Assistance Needed: Pt ambulates with RW and min assist to steady per family member           Hand Dominance        Extremity/Trunk Assessment   Upper Extremity Assessment: Generalized weakness           Lower Extremity Assessment: Generalized weakness      Cervical / Trunk Assessment: Kyphotic  Communication   Communication: No difficulties  Cognition Arousal/Alertness: Awake/alert Behavior During Therapy: WFL for tasks assessed/performed Overall Cognitive Status: History of cognitive impairments -  at baseline                      General Comments      Exercises        Assessment/Plan    PT Assessment Patient needs continued PT services  PT Diagnosis Difficulty walking   PT Problem List Decreased strength;Decreased range of motion;Decreased activity tolerance;Decreased mobility;Decreased knowledge of use of DME;Decreased cognition   PT Treatment Interventions DME instruction;Gait training;Stair training;Functional mobility training;Therapeutic activities;Therapeutic exercise;Patient/family education   PT Goals (Current goals can be found in the Care Plan section) Acute Rehab PT Goals Patient Stated Goal: Get out of bed PT Goal Formulation: With patient Time For Goal Achievement: 11/24/15 Potential to Achieve Goals: Good    Frequency Min 3X/week   Barriers to discharge        Co-evaluation               End of Session Equipment Utilized During Treatment: Gait belt Activity Tolerance: Patient limited by fatigue Patient left: in chair;with call bell/phone within reach Nurse Communication: Mobility status         Time: [id number] PT Time Calculation (min) (ACUTE ONLY): 29 min   Charges:   PT Evaluation $Initial PT Evaluation Tier I: 1 Procedure PT Treatments $Therapeutic Activity: 8-22 mins   PT G Codes:        Council Munguia November 28, 2015, 4:25 PM

## 2015-11-11 ENCOUNTER — Inpatient Hospital Stay (HOSPITAL_COMMUNITY): Payer: Medicare Other

## 2015-11-11 MED ORDER — VITAMINS A & D EX OINT
TOPICAL_OINTMENT | CUTANEOUS | Status: AC
  Filled 2015-11-11: qty 5

## 2015-11-11 MED ORDER — VITAMINS A & D EX OINT
TOPICAL_OINTMENT | CUTANEOUS | Status: AC
  Administered 2015-11-12: 1
  Filled 2015-11-11: qty 5

## 2015-11-11 NOTE — Progress Notes (Signed)
[facility] PROGRESS NOTE  Bradley [name] [id number] DOB: Aug 03, 1941 DOA: 11/07/2015 PCP: [name], MD Interim summary: Bradley [name] is a 74 y.o. male with h/o hypertension, hyperlipidemia, stroke, MGUS, dementia was brought in by family members for worsening generalized weakness. CXR on admission showed bilateral infiltrates improved from prior exam. Overnight pt apparently had some water with a straw by mistake, and family was concerned.  He is not hypoxic, no fever and but he was found to be tachypnic and repeat CXR was done showed worsening air space disease probably infectious etiology. He was started on zosyn for presumed aspiration pneumonia.  Around 8:30 am, on 11/11, pt was found to be slumped on one side and tachypnic and RN, checked his vitals and he was foundt o be hypoxic requiring 100% oxygen. RRT called. He was transferred to step down and he remains on 50 % venti mask with sats in upper 90%'s.  He was transitioned to [place] oxygen on 11/13 on 2 lit [place]. He is sittin in the chair and eating.    Assessment/Plan: Acute respiratory failure with hypoxia  Probably from aspiration pneumonia: transferred to step down on 11/11 and on venti mask to keep sats greater than 90%, transitioned to [place] oxygen on 11/13.  - added vancomycin to zosyn.  -  will benefit from SLP eval,  as per the daughter at bedside, he was coughing with thickened liquids. SLP eval recommended medication adm crushed with puree and to be on dysphagia 1 diet.  -  ABG shows PH 7.445, pco2,36.1, po2 60.2, bicarbonate 24.4.  - CXR repeated on 11/11 showed widespread airspace consolidation. Small effusions with cardiomegaly and pulm venous hypertension suggesting CHF. He was given lasix , changed antibiotics and CXR repeated on 11/13 showed improvement in the air space disease and improved aeration.  - he is clinically improving, tachypnea resolved. His oxygen sats have improved, plan to wean him down and transfer out  of step down. .     Generalized weakness: PT eval for deconditioning recommended home health PT.   Hypernatremia: - resolved with fluids. 1/2 NS fluids stopped on the morning of 11/10  Anemia: normocytic,  Stable.  Repeat in am stable.   Elevated troponin: no chest pain, cardiology consulted by [name], recommended Further work up warranted   Abnormal TSH: Free t3 and free t4 ordered and are found to be elevated. He is not on any thyroid supplements.  He will need work up for hyperthyroidism.    Code Status: full code.  Family Communication: discussed with daughter over the phone.  Disposition Plan: pending resolution of pneumonia and PT eval.    Consultants:  pCCM.  Procedures:  none  Antibiotics:  Zosyn 11/10  Vancomycin 11/11  HPI/Subjective: Wants to know when he can go home.   Objective: Filed Vitals:   11/11/15 1500  BP: 117/44  Pulse: 67  Temp:   Resp: 13    Intake/Output Summary (Last 24 hours) at 11/11/15 1723 Last data filed at 11/11/15 1544  Gross per 24 hour  Intake    100 ml  Output   1700 ml  Net  -1600 ml   Filed Weights   11/09/15 0940 11/10/15 0332 11/11/15 0700  Weight: 50.2 kg (110 lb 10.7 oz) 53.7 kg (118 lb 6.2 oz) 49.3 kg (108 lb 11 oz)    Exam:   General:  Alert confused but comfortable.   Cardiovascular: s1s2  Respiratory: scattered rhonchi on the right   Abdomen: soft NT  nd bs+  Musculoskeletal: no pedal edema.   Data Reviewed: Basic Metabolic Panel:  Recent Labs Lab 11/07/15 1143 11/08/15 0515 11/09/15 1045 11/10/15 0400  NA 146* 145 143 145  K 3.6 3.7 4.2 4.0  CL 112* 111 108 109  CO2 25 28 27 27   GLUCOSE 108* 112* 107* 98  BUN 21* 20 28* 32*  CREATININE 0.85 0.79 1.06 1.03  CALCIUM 9.9 9.9 9.6 9.8   Liver Function Tests:  Recent Labs Lab 11/09/15 1045  AST 33  ALT 47  ALKPHOS 90  BILITOT 0.9  PROT 7.2  ALBUMIN 3.2*   No results for input(s): LIPASE, AMYLASE in the last 168 hours. No  results for input(s): AMMONIA in the last 168 hours. CBC:  Recent Labs Lab 11/07/15 1143 11/09/15 1045 11/10/15 0400  WBC 3.9* 5.6 4.4  HGB 9.9* 10.4* 9.5*  HCT 30.9* 33.3* 30.7*  MCV 84.9 84.5 83.2  PLT 145* 179 171   Cardiac Enzymes:  Recent Labs Lab 11/07/15 1143  TROPONINI 0.09*   BNP (last 3 results)  Recent Labs  04/02/15 1533  BNP 2635.5*    ProBNP (last 3 results)  Recent Labs  11/16/14 1842 12/11/14 1448 05/24/15 0906  PROBNP 5173.0* 1169.0* 789.0*    CBG:  Recent Labs Lab 11/09/15 2231  GLUCAP 103*    Recent Results (from the past 240 hour(s))  Urine culture     Status: None   Collection Time: 11/02/15  9:21 PM  Result Value Ref Range Status   Specimen Description URINE, CATHETERIZED  Final   Special Requests Normal  Final   Culture MULTIPLE SPECIES PRESENT, SUGGEST RECOLLECTION  Final   Report Status 11/04/2015 FINAL  Final  MRSA PCR Screening     Status: None   Collection Time: 11/09/15  9:50 AM  Result Value Ref Range Status   MRSA by PCR NEGATIVE NEGATIVE Final    Comment:        The GeneXpert MRSA Assay (FDA approved for NASAL specimens only), is one component of a comprehensive MRSA colonization surveillance program. It is not intended to diagnose MRSA infection nor to guide or monitor treatment for MRSA infections.   Culture, blood (routine x 2)     Status: None (Preliminary result)   Collection Time: 11/09/15  6:40 PM  Result Value Ref Range Status   Specimen Description BLOOD RIGHT ARM  Final   Special Requests BOTTLES DRAWN AEROBIC AND ANAEROBIC  10CC  Final   Culture   Final    NO GROWTH 2 DAYS Performed at [facility]    Report Status PENDING  Incomplete  Culture, blood (routine x 2)     Status: None (Preliminary result)   Collection Time: 11/09/15  6:45 PM  Result Value Ref Range Status   Specimen Description BLOOD RIGHT ARM  Final   Special Requests BOTTLES DRAWN AEROBIC AND ANAEROBIC  10CC  Final    Culture   Final    NO GROWTH 2 DAYS Performed at [facility]    Report Status PENDING  Incomplete     Studies: Dg Chest 2 View  11/11/2015  CLINICAL DATA:  74 year old male with shortness of breath. EXAM: CHEST  2 VIEW COMPARISON:  11/09/2015 and prior radiographs FINDINGS: The patient is rotated to the left. Decreased airspace opacities within the right lung noted. Slightly increased atelectasis/airspace disease/consolidation within the left lung noted. A probable small left pleural effusion is present. There is no evidence of pneumothorax. IMPRESSION: Slightly increased  atelectasis/airspace disease/consolidation within the left lung. Probable small left pleural effusion. Improved right lung aeration. Electronically Signed   By: Margarette Canada M.D.   On: 11/11/2015 09:28    Scheduled Meds: . amLODipine  10 mg Oral Daily  . antiseptic oral rinse  7 mL Mouth Rinse BID  . aspirin  81 mg Oral Daily  . atorvastatin  10 mg Oral Daily  . enoxaparin (LOVENOX) injection  40 mg Subcutaneous Q24H  . hydrALAZINE  100 mg Oral Daily  . hydrALAZINE  50 mg Oral QHS  . labetalol  300 mg Oral BID  . piperacillin-tazobactam (ZOSYN)  IV  3.375 g Intravenous 3 times per day  . vancomycin  1,000 mg Intravenous Q24H   Continuous Infusions:   Principal Problem:   UTI (lower urinary tract infection) Active Problems:   Diastolic dysfunction, left ventricle   Hypertension   Chronic renal insufficiency, stage III (moderate)   Anemia-MGUS   Demand ischemia (HCC)   Dyslipidemia   Severe aortic stenosis   H/O: CVA  with Lt hemiparesis   Goiter   Weakness   Aspiration pneumonia ([place])    Time spent:25 minutes    Torsten Weniger  [facility] Pager [phone]  If 7PM-7AM, please contact night-coverage at [email or web address], password [facility] 11/11/2015, 5:23 PM  LOS: 2 days

## 2015-11-12 MED ORDER — RESOURCE THICKENUP CLEAR PO POWD: ORAL | Status: AC

## 2015-11-12 MED ORDER — AMOXICILLIN-POT CLAVULANATE 875-125 MG PO TABS: 1.0000 | ORAL_TABLET | Freq: Two times a day (BID) | ORAL | Status: DC

## 2015-11-12 NOTE — Progress Notes (Signed)
Pt discharged with family via private vehicle. IV discontinued and site is clean, dry and intact. AVS given and discussed with daughter, Sharyn Lull and all questions answered.

## 2015-11-12 NOTE — Progress Notes (Signed)
Speech Language Pathology Treatment: Dysphagia  Patient Details Name: Bradley [name] MRN: [id number] DOB: Oct 10, 1941 Today's Date: 11/12/2015 Time: [id number] SLP Time Calculation (min) (ACUTE ONLY): 40 min  Assessment / Plan / Recommendation Clinical Impression  Pt seen to assess po tolerance and indication for dietary advancement or repeat instrumental evaluation.  Pt is on room air and breathing appears comfortable today.    RN reports pt with good tolerance of po diet with no coughing.  Pt's cousin Bradley [name] also concurs pt with good tolerance of intake but admits he needs cues to follow through with compensation strategies. Reviewed with pt and pt's cousin, results of last MBS 03/2015 with audible aspiration of thin and clinical reasoning for current diet and compensation strategies.  Bradley [name] helps pt's care and reports dys2/nectar diet premorbid with good tolerance.  Advised goal of advancing diet to dys2/nectar when/if indicated.      SLP spoke to daughter Bradley [name] on the phone re: care plan for pt and [employer] Protocol reviewed for consideration when pt is well to maxmize hydration as safely as possible  SLP observed pt consuming honey thick liquids via cup- delayed swallow continued with pt requiring max cues to swallow twice with liquids.  No indication of airway compromise.  Due to decrease control with intake, SLP recommends to continue honey thick liquids with full supervision for strategies.  All in agreement - pt, cousin Bradley [name] and daughter Bradley [name].    HPI HPI: Bradley [name] is a 74 y.o. male with h/o hypertension, hyperlipidemia, stroke, MGUS, dementia was brought in by family members for worsening generalized weakness. CXR on admission showed bilateral infiltrates improved from prior exam. Overnight pt apparently had some water with a straw by mistake, and family was concerned.       SLP Plan  Continue with current plan of care     Recommendations  Diet recommendations:  Dysphagia 1 (puree);Honey-thick liquid Liquids provided via: Cup;No straw Medication Administration:  (with puree) Supervision: Patient able to self feed;Full supervision/cueing for compensatory strategies Compensations: Follow solids with liquid;Slow rate;Small sips/bites (dry swallow after 2 sips of liquids) Postural Changes and/or Swallow Maneuvers: Seated upright 90 degrees;Upright 30-60 min after meal              Follow up Recommendations: Other (comment) (tbd) Plan: Continue with current plan of care   Claudie Fisherman, [facility] SLP [phone]

## 2015-11-12 NOTE — Progress Notes (Signed)
Date: November 12, 2015 Chart reviewed for concurrent status and case management needs. Will continue to follow patient for changes and needs:  hhc needs set up rn, pt. ot and aide through [facility], [place], [place], [place]   [phone]

## 2015-11-12 NOTE — Discharge Summary (Signed)
Physician Discharge Summary  Bradley [name] [id number] DOB: August 01, 1941 DOA: 11/07/2015  PCP: [name], MD  Admit date: 11/07/2015 Discharge date: 11/12/2015  Time spent: 30 minutes  Recommendations for Outpatient Follow-up:  1. Follow up with PCP in one week.  2. Please follow up with repeat thyroid panel in 4 weeks.  3. Please follow upw ith home health PT RN.    Discharge Diagnoses:  Principal Problem:   UTI (lower urinary tract infection) Active Problems:   Diastolic dysfunction, left ventricle   Hypertension   Chronic renal insufficiency, stage III (moderate)   Anemia-MGUS   Demand ischemia (HCC)   Dyslipidemia   Severe aortic stenosis   H/O: CVA  with Lt hemiparesis   Goiter   Weakness   Aspiration pneumonia ([place])   Discharge Condition: improved  Diet recommendation: DYSPHAGIA 1 diet with nectar thick.  Filed Weights   11/10/15 0332 11/11/15 0700 11/12/15 0413  Weight: 53.7 kg (118 lb 6.2 oz) 49.3 kg (108 lb 11 oz) 52.3 kg (115 lb 4.8 oz)    History of present illness:  Bradley [name] is a 74 y.o. male with h/o hypertension, hyperlipidemia, stroke, MGUS, dementia was brought in by family members for worsening generalized weakness. CXR on admission showed bilateral infiltrates improved from prior exam. Overnight pt apparently had some water with a straw by mistake, and family was concerned.  He is not hypoxic, no fever and but he was found to be tachypnic and repeat CXR was done showed worsening air space disease probably infectious etiology. He was started on zosyn for presumed aspiration pneumonia. Around 8:30 am, on 11/11, pt was found to be slumped on one side and tachypnic and RN, checked his vitals and he was foundt o be hypoxic requiring 100% oxygen. RRT called. He was transferred to step down and he remains on 50 % venti mask with sats in upper 90%. He was transitioned off [place] oxygen and is on RA on ambulation, his sats have been well over 90%.    Hospital Course:  Acute respiratory failure with hypoxia Probably from aspiration pneumonia: transferred to step down on 11/11 and on venti mask to keep sats greater than 90%, transitioned to [place] oxygen on 11/13. currently off oxygen on day of discharge with good sats.  - will benefit from SLP eval, as per the daughter at bedside, he was coughing with thickened liquids. SLP eval recommended medication adm crushed with puree and to be on dysphagia 1 diet.  - CXR repeated on 11/11 showed widespread airspace consolidation. Small effusions with cardiomegaly and pulm venous hypertension suggesting CHF. He was given lasix , changed antibiotics and CXR repeated on 11/13 showed improvement in the air space disease and improved aeration.  - he is clinically improving, tachypnea resolved. His oxygen sats have improved, discharge him on oral antibiotics to complete the course.     Generalized weakness: PT eval for deconditioning recommended home health PT.   Hypernatremia: - resolved with fluids. 1/2 NS fluids stopped on the morning of 11/10  Anemia: normocytic,  Stable.  Repeat in am stable.   Elevated troponin: no chest pain, cardiology consulted by [name], recommended  no Further work up warranted   Abnormal TSH: Free t3 and free t4 ordered and are found to be elevated. He is not on any thyroid supplements.  He will need work up for hyperthyroidism. First we recommend getting repeat thyroid panel in 4 weeks and if still elevated, start him on PTU or methimazole  Procedures: None   Consultations:  Speech eval.   Discharge Exam: Filed Vitals:   11/12/15 0930  BP: 154/82  Pulse: 82  Temp:   Resp: 33    General: alert afebrile comfortable.  Cardiovascular: s1s2 Respiratory: ctab  Discharge Instructions   Discharge Instructions    Diet - low sodium heart healthy    Complete by:  As directed      Discharge instructions    Complete by:  As directed   Follow up with PCP  in 2 weeks.          Current Discharge Medication List    START taking these medications   Details  amoxicillin-clavulanate (AUGMENTIN) 875-125 MG tablet Take 1 tablet by mouth 2 (two) times daily. Qty: 10 tablet, Refills: 0    Maltodextrin-Xanthan Gum (RESOURCE THICKENUP CLEAR) POWD With every meal      CONTINUE these medications which have NOT CHANGED   Details  ALPRAZolam (XANAX) 0.25 MG tablet Take 0.5-1 tablets by mouth daily as needed for anxiety.  Refills: 0    amLODipine (NORVASC) 10 MG tablet Take 10 mg by mouth daily.    aspirin 81 MG tablet Take 81 mg by mouth daily.    atorvastatin (LIPITOR) 10 MG tablet Take 10 mg by mouth daily.    furosemide (LASIX) 40 MG tablet Take 40 mg by mouth daily. Refills: 0    hydrALAZINE (APRESOLINE) 100 MG tablet Take 50-100 mg by mouth 2 (two) times daily. Take 100 mg every morning and 50 mg every night. Refills: 5    labetalol (NORMODYNE) 300 MG tablet Take 1 tablet (300 mg total) by mouth 2 (two) times daily.    pramoxine (PROCTOFOAM) 1 % foam Place 1 application rectally 3 (three) times daily as needed for itching. Qty: 15 g, Refills: 0    albuterol (PROVENTIL) (2.5 MG/3ML) 0.083% nebulizer solution Take 3 mLs (2.5 mg total) by nebulization every 4 (four) hours as needed for wheezing or shortness of breath. Qty: 75 mL, Refills: 0    docusate sodium (COLACE) 100 MG capsule Take 1 capsule (100 mg total) by mouth every 12 (twelve) hours. Qty: 14 capsule, Refills: 0    ipratropium-albuterol (DUONEB) 0.5-2.5 (3) MG/3ML SOLN Take 3 mLs by nebulization 2 (two) times daily. Qty: 360 mL      STOP taking these medications     cephALEXin (KEFLEX) 500 MG capsule      potassium chloride SA (K-DUR,KLOR-CON) 20 MEQ tablet        No Known Allergies Follow-up Information    Follow up with [name], MD. Schedule an appointment as soon as possible for a visit in 1 week.   Specialty:  Family Medicine   Contact  information:   [place] [address] [phone]        The results of significant diagnostics from this hospitalization (including imaging, microbiology, ancillary and laboratory) are listed below for reference.    Significant Diagnostic Studies: Dg Chest 2 View  11/11/2015  CLINICAL DATA:  74 year old male with shortness of breath. EXAM: CHEST  2 VIEW COMPARISON:  11/09/2015 and prior radiographs FINDINGS: The patient is rotated to the left. Decreased airspace opacities within the right lung noted. Slightly increased atelectasis/airspace disease/consolidation within the left lung noted. A probable small left pleural effusion is present. There is no evidence of pneumothorax. IMPRESSION: Slightly increased atelectasis/airspace disease/consolidation within the left lung. Probable small left pleural effusion. Improved right lung aeration. Electronically Signed   By: Dellis Filbert  Hu M.D.   On: 11/11/2015 09:28   Dg Chest 2 View  11/08/2015  CLINICAL DATA:  Aspiration pneumonia. EXAM: CHEST  2 VIEW COMPARISON:  11/07/2015. FINDINGS: Lungs are adequately inflated with worsening airspace opacification over the posterior mid to lower lungs right greater than left likely infection. Cannot exclude a small amount of posterior pleural fluid. Mild stable cardiomegaly. Calcified plaque is present over the aortic arch. Remainder of the exam is unchanged. IMPRESSION: Worsening airspace process over the and posterior mid to lower lungs right greater than left suggesting infection. Cannot exclude a small amount of pleural fluid. Electronically Signed   By: Marin Olp M.D.   On: 11/08/2015 11:37   Dg Chest 2 View  11/07/2015  CLINICAL DATA:  Weakness.  Dementia. EXAM: CHEST  2 VIEW COMPARISON:  04/04/2015. FINDINGS: Mediastinum and hilar structures are normal. Cardiomegaly. Improving bilateral pulmonary alveolar infiltrates are noted. No pleural effusion or pneumothorax. No acute bony  abnormality. Degenerative changes both shoulders. Exam IMPRESSION: 1. Bilateral pulmonary alveolar infiltrates, improved from prior exam . 2. Stable cardiomegaly. Electronically Signed   By: Marcello Moores  Register   On: 11/07/2015 12:07   Dg Chest Port 1 View  11/09/2015  CLINICAL DATA:  Shortness of Breath EXAM: PORTABLE CHEST 1 VIEW COMPARISON:  November 08, 2015 FINDINGS: Extensive airspace consolidation remains throughout both mid and lower lung zones. There are small pleural effusions bilaterally. Heart is enlarged with mild pulmonary venous hypertension. There is atherosclerotic calcification in aorta. No adenopathy. No pneumothorax. IMPRESSION: Widespread airspace consolidation persists. Small effusions with cardiomegaly and pulmonary venous hypertension suggest a degree of underlying congestive heart failure. Follow these changes could be due to congestive heart failure. The appearance of the lungs does suggests that there is superimposed pneumonia. Electronically Signed   By: Lowella Grip III M.D.   On: 11/09/2015 09:16    Microbiology: Recent Results (from the past 240 hour(s))  Urine culture     Status: None   Collection Time: 11/02/15  9:21 PM  Result Value Ref Range Status   Specimen Description URINE, CATHETERIZED  Final   Special Requests Normal  Final   Culture MULTIPLE SPECIES PRESENT, SUGGEST RECOLLECTION  Final   Report Status 11/04/2015 FINAL  Final  MRSA PCR Screening     Status: None   Collection Time: 11/09/15  9:50 AM  Result Value Ref Range Status   MRSA by PCR NEGATIVE NEGATIVE Final    Comment:        The GeneXpert MRSA Assay (FDA approved for NASAL specimens only), is one component of a comprehensive MRSA colonization surveillance program. It is not intended to diagnose MRSA infection nor to guide or monitor treatment for MRSA infections.   Culture, blood (routine x 2)     Status: None (Preliminary result)   Collection Time: 11/09/15  6:40 PM  Result Value  Ref Range Status   Specimen Description BLOOD RIGHT ARM  Final   Special Requests BOTTLES DRAWN AEROBIC AND ANAEROBIC  10CC  Final   Culture   Final    NO GROWTH 2 DAYS Performed at [facility]    Report Status PENDING  Incomplete  Culture, blood (routine x 2)     Status: None (Preliminary result)   Collection Time: 11/09/15  6:45 PM  Result Value Ref Range Status   Specimen Description BLOOD RIGHT ARM  Final   Special Requests BOTTLES DRAWN AEROBIC AND ANAEROBIC  10CC  Final   Culture   Final  NO GROWTH 2 DAYS Performed at [facility]    Report Status PENDING  Incomplete     Labs: Basic Metabolic Panel:  Recent Labs Lab 11/07/15 1143 11/08/15 0515 11/09/15 1045 11/10/15 0400  NA 146* 145 143 145  K 3.6 3.7 4.2 4.0  CL 112* 111 108 109  CO2 25 28 27 27   GLUCOSE 108* 112* 107* 98  BUN 21* 20 28* 32*  CREATININE 0.85 0.79 1.06 1.03  CALCIUM 9.9 9.9 9.6 9.8   Liver Function Tests:  Recent Labs Lab 11/09/15 1045  AST 33  ALT 47  ALKPHOS 90  BILITOT 0.9  PROT 7.2  ALBUMIN 3.2*   No results for input(s): LIPASE, AMYLASE in the last 168 hours. No results for input(s): AMMONIA in the last 168 hours. CBC:  Recent Labs Lab 11/07/15 1143 11/09/15 1045 11/10/15 0400  WBC 3.9* 5.6 4.4  HGB 9.9* 10.4* 9.5*  HCT 30.9* 33.3* 30.7*  MCV 84.9 84.5 83.2  PLT 145* 179 171   Cardiac Enzymes:  Recent Labs Lab 11/07/15 1143  TROPONINI 0.09*   BNP: BNP (last 3 results)  Recent Labs  04/02/15 1533  BNP 2635.5*    ProBNP (last 3 results)  Recent Labs  11/16/14 1842 12/11/14 1448 05/24/15 0906  PROBNP 5173.0* 1169.0* 789.0*    CBG:  Recent Labs Lab 11/09/15 2231  GLUCAP 103*       Signed:  Ikenna Ohms  [facility] 11/12/2015, 1:12 PM

## 2015-11-12 NOTE — Progress Notes (Signed)
Pharmacy Antibiotic Time-Out Note  Bradley [name] is a 74 y.o. year-old male admitted on 11/07/2015.  The patient is currently on Vancomycin and Zosyn for aspiration PNA.  Assessment/Plan: Chest Xray 11/11 suggests superimposed PNA.  Clinically improving per MD note with plan to transfer out of SDU.  F/u duration of therapy and narrowing of antibiotics.  If Vancomycin still on board 11/15, check VT and renal function.     Recent Labs Lab 11/07/15 1143 11/09/15 1045 11/10/15 0400  WBC 3.9* 5.6 4.4    Recent Labs Lab 11/07/15 1143 11/08/15 0515 11/09/15 1045 11/10/15 0400  CREATININE 0.85 0.79 1.06 1.03   Estimated Creatinine Clearance: 47.3 mL/min (by C-G formula based on Cr of 1.03). Tmax/24h 99  Antimicrobial allergies: None  Antimicrobials this admission: 11/10 >> Zosyn >> 11/11 >> Vanc >>  Levels/dose changes this admission: None  Microbiology Results: 11/11 blood: NGTD MRSA nasal swab: neg  Thank you for allowing pharmacy to be a part of this patient's care.  Ralene Bathe, PharmD, BCPS 11/12/2015, 12:50 PM  Pager: [phone]

## 2015-11-12 NOTE — Clinical Documentation Improvement (Signed)
Internal Medicine  Can the diagnosis of CHF be further specified?    Acuity - Acute, Chronic, Acute on Chronic   Type - Systolic, Diastolic, Systolic and Diastolic  Other  Clinically Undetermined  Supporting Information: Document any associated diagnoses/conditions: Current documentation reflects history of chf. 11/11/2015 progress note states diastolic dysfunction, left ventricle.  Lasix 20mg  ordered 11/09/15.    Please exercise your independent, professional judgment when responding. A specific answer is not anticipated or expected. Please update your documentation within the medical record to reflect your response to this query.  Thank you, Mateo Flow, RN [phone] Clinical Documentation Specialist

## 2015-11-12 NOTE — Progress Notes (Signed)
Date: November 12, 2015 Chart reviewed for concurrent status and case management needs. Will continue to follow patient for changes and needs: Rhonda Davis, RN, BSN, CCM   [phone] 

## 2015-11-14 LAB — CULTURE, BLOOD (ROUTINE X 2)
Culture: NO GROWTH
Culture: NO GROWTH

## 2015-12-10 ENCOUNTER — Telehealth: Payer: Self-pay | Admitting: Internal Medicine

## 2015-12-10 NOTE — Telephone Encounter (Signed)
New Message     Pt's daughter calling wanting to know if pt needs fasting labs for his upcoming appt w/ Tarri Fuller on 12/14/15. Please call back and advise.

## 2015-12-11 NOTE — Telephone Encounter (Signed)
No fasting labs Bradley [name], [name]

## 2015-12-13 NOTE — Telephone Encounter (Signed)
Informed patient's daughter, Particia Nearing.

## 2015-12-14 ENCOUNTER — Encounter: Payer: Self-pay | Admitting: Physician Assistant

## 2015-12-14 ENCOUNTER — Ambulatory Visit: Payer: Medicare Other | Admitting: Internal Medicine

## 2015-12-14 ENCOUNTER — Ambulatory Visit (INDEPENDENT_AMBULATORY_CARE_PROVIDER_SITE_OTHER): Payer: Medicare Other | Admitting: Physician Assistant

## 2015-12-14 VITALS — BP 130/82 | HR 68 | Ht 65.0 in | Wt 118.0 lb

## 2015-12-14 DIAGNOSIS — E785 Hyperlipidemia, unspecified: Secondary | ICD-10-CM

## 2015-12-14 DIAGNOSIS — I10 Essential (primary) hypertension: Secondary | ICD-10-CM

## 2015-12-14 DIAGNOSIS — I350 Nonrheumatic aortic (valve) stenosis: Secondary | ICD-10-CM

## 2015-12-14 NOTE — Patient Instructions (Signed)
Your physician recommends that you schedule a follow-up appointment in: 3 months with Dr Harrington Challenger. No changes were made in your therapy today.

## 2015-12-14 NOTE — Progress Notes (Signed)
Patient ID: Bradley [name], male   DOB: Oct 26, 1941, 74 y.o.   MRN: [id number]    Date:  12/14/2015   ID:  Bradley [name], DOB 1941-04-04, MRN [id number]  PCP:  [name], MD  Primary Cardiologist:  Harrington Challenger   Chief Complaint  Patient presents with  . Follow-up    no chest pain, no shortness of breath, occassional edema, no pain or cramping in legs, no lightheaded or dizziness     History of Present Illness: Bradley [name] is a 74 y.o. male with a history of severe AS and prior CVA. He was last admitted in April 2016 with UTI and confusion. He was seen by Dr Burt Knack for possible TAVR but was not felt to be a candidate- "I think in the setting of his poor functional capacity, the risk/benefit of pursuing further treatment for his aortic stenosis is unfavorable. There is a high likelihood that his functional capacity would not be significantly improved. The patient's family understands that without treatment, he will likely develop progressive congestive heart failure and that is overall prognosis is poor. The patient and his family strongly favor a conservative approach with palliative medical therapy."   His family brought him to the ER November 07, 2015  with mental status change and suspected UTI, same as his admission in April. His main problem was acute respiratory failure with hypoxia which is probably from aspiration pneumonia. Cardiology was consulted for a Troponin of 0.09.   Patient is here for evaluation.  The patient currently denies nausea, vomiting, fever, chest pain, shortness of breath, orthopnea, dizziness, PND, cough, congestion, abdominal pain, hematochezia, melena, lower extremity edema, claudication.  Wt Readings from Last 3 Encounters:  12/14/15 118 lb (53.524 kg)  11/12/15 115 lb 4.8 oz (52.3 kg)  05/24/15 124 lb 12.8 oz (56.609 kg)     Past Medical History  Diagnosis Date  . Stroke ([place])   . Hypertension   . Hyperlipidemia   . Aortic stenosis   . CHF (congestive  heart failure) ([place])   . MGUS (monoclonal gammopathy of unknown significance)   . Goiter   . Rhabdomyolysis   . UTI (urinary tract infection) 05/09/2014  . Dementia     Current Outpatient Prescriptions  Medication Sig Dispense Refill  . albuterol (PROVENTIL) (2.5 MG/3ML) 0.083% nebulizer solution Take 3 mLs (2.5 mg total) by nebulization every 4 (four) hours as needed for wheezing or shortness of breath. 75 mL 0  . ALPRAZolam (XANAX) 0.25 MG tablet Take 0.5-1 tablets by mouth daily as needed for anxiety.   0  . amLODipine (NORVASC) 10 MG tablet Take 10 mg by mouth daily.    Marland Kitchen amoxicillin-clavulanate (AUGMENTIN) 875-125 MG tablet Take 1 tablet by mouth 2 (two) times daily. 10 tablet 0  . aspirin 81 MG tablet Take 81 mg by mouth daily.    Marland Kitchen atorvastatin (LIPITOR) 10 MG tablet Take 10 mg by mouth daily.    Marland Kitchen docusate sodium (COLACE) 100 MG capsule Take 1 capsule (100 mg total) by mouth every 12 (twelve) hours. 14 capsule 0  . furosemide (LASIX) 40 MG tablet Take 40 mg by mouth daily.  0  . hydrALAZINE (APRESOLINE) 100 MG tablet Take 50-100 mg by mouth 2 (two) times daily. Take 100 mg every morning and 50 mg every night.  5  . ipratropium-albuterol (DUONEB) 0.5-2.5 (3) MG/3ML SOLN Take 3 mLs by nebulization 2 (two) times daily. 360 mL   . labetalol (NORMODYNE) 300 MG tablet Take 1 tablet (  300 mg total) by mouth 2 (two) times daily.    . Maltodextrin-Xanthan Gum (RESOURCE THICKENUP CLEAR) POWD With every meal    . pramoxine (PROCTOFOAM) 1 % foam Place 1 application rectally 3 (three) times daily as needed for itching. 15 g 0   No current facility-administered medications for this visit.    Allergies:   No Known Allergies  Social History:  The patient  reports that he has never smoked. He has never used smokeless tobacco. He reports that he does not drink alcohol or use illicit drugs.   Family history:   Family History  Problem Relation Age of Onset  . Family history unknown: Yes     ROS:  Please see the history of present illness.  All other systems reviewed and negative.   PHYSICAL EXAM: VS:  BP 130/82 mmHg  Pulse 68  Ht 5\' 5"  (1.651 m)  Wt 118 lb (53.524 kg)  BMI 19.64 kg/m2 Thin, well developed, in no acute distress, patient is sitting in a wheelchair with normal respiratory rate. HEENT: Pupils are equal round react to light accommodation extraocular movements are intact.  Neck: no JVDNo cervical lymphadenopathy. Cardiac: Regular rate and rhythm 3/6 systolic murmur loudest in the axilla Lungs:  clear to auscultation bilaterally, no wheezing, rhonchi or rales Abd: soft, nontender, positive bowel sounds all quadrants,  Ext: no lower extremity edema.  2+ radial pulses. Skin: warm and dry Neuro:  Grossly normal    ASSESSMENT AND PLAN:  Problem List Items Addressed This Visit    Severe aortic stenosis (Chronic)   Hypertension - Primary (Chronic)   Dyslipidemia (Chronic)     Plans for conservative management for the patient's severe aortic stenosis. He has already been evaluated by Dr. Burt Knack for TA VR.  His blood pressure is controlled. He is on a statin for dyslipidemia. His weight is stable and his daughter monitors it very closely. Signs or symptoms of acute heart failure.  Follow-up in 3 months with Dr. Harrington Challenger

## 2016-01-22 ENCOUNTER — Emergency Department (HOSPITAL_BASED_OUTPATIENT_CLINIC_OR_DEPARTMENT_OTHER): Payer: Medicare Other

## 2016-01-22 ENCOUNTER — Encounter (HOSPITAL_BASED_OUTPATIENT_CLINIC_OR_DEPARTMENT_OTHER): Payer: Self-pay

## 2016-01-22 ENCOUNTER — Emergency Department (HOSPITAL_BASED_OUTPATIENT_CLINIC_OR_DEPARTMENT_OTHER)
Admission: EM | Admit: 2016-01-22 | Discharge: 2016-01-22 | Disposition: A | Discharge status: Home / Self Care | Payer: Medicare Other | Attending: Emergency Medicine | Admitting: Emergency Medicine

## 2016-01-22 DIAGNOSIS — F0390 Unspecified dementia without behavioral disturbance: Secondary | ICD-10-CM | POA: Diagnosis not present

## 2016-01-22 DIAGNOSIS — R05 Cough: Secondary | ICD-10-CM | POA: Insufficient documentation

## 2016-01-22 DIAGNOSIS — N390 Urinary tract infection, site not specified: Secondary | ICD-10-CM | POA: Diagnosis not present

## 2016-01-22 DIAGNOSIS — Z8673 Personal history of transient ischemic attack (TIA), and cerebral infarction without residual deficits: Secondary | ICD-10-CM | POA: Insufficient documentation

## 2016-01-22 DIAGNOSIS — I10 Essential (primary) hypertension: Secondary | ICD-10-CM | POA: Diagnosis not present

## 2016-01-22 DIAGNOSIS — Z79899 Other long term (current) drug therapy: Secondary | ICD-10-CM | POA: Diagnosis not present

## 2016-01-22 DIAGNOSIS — E785 Hyperlipidemia, unspecified: Secondary | ICD-10-CM | POA: Diagnosis not present

## 2016-01-22 DIAGNOSIS — Z862 Personal history of diseases of the blood and blood-forming organs and certain disorders involving the immune mechanism: Secondary | ICD-10-CM | POA: Diagnosis not present

## 2016-01-22 DIAGNOSIS — Z7982 Long term (current) use of aspirin: Secondary | ICD-10-CM | POA: Diagnosis not present

## 2016-01-22 DIAGNOSIS — J3489 Other specified disorders of nose and nasal sinuses: Secondary | ICD-10-CM | POA: Diagnosis not present

## 2016-01-22 DIAGNOSIS — I509 Heart failure, unspecified: Secondary | ICD-10-CM | POA: Diagnosis not present

## 2016-01-22 DIAGNOSIS — Z8739 Personal history of other diseases of the musculoskeletal system and connective tissue: Secondary | ICD-10-CM | POA: Insufficient documentation

## 2016-01-22 DIAGNOSIS — R5383 Other fatigue: Secondary | ICD-10-CM | POA: Diagnosis present

## 2016-01-22 LAB — CBC WITH DIFFERENTIAL/PLATELET
BASOS ABS: 0 10*3/uL (ref 0.0–0.1)
BASOS PCT: 0 %
EOS ABS: 0.1 10*3/uL (ref 0.0–0.7)
EOS PCT: 1 %
HCT: 33.1 % — ABNORMAL LOW (ref 39.0–52.0)
Hemoglobin: 10.3 g/dL — ABNORMAL LOW (ref 13.0–17.0)
Lymphocytes Relative: 22 %
Lymphs Abs: 1.2 10*3/uL (ref 0.7–4.0)
MCH: 26.4 pg (ref 26.0–34.0)
MCHC: 31.1 g/dL (ref 30.0–36.0)
MCV: 84.9 fL (ref 78.0–100.0)
MONO ABS: 0.5 10*3/uL (ref 0.1–1.0)
Monocytes Relative: 9 %
Neutro Abs: 3.6 10*3/uL (ref 1.7–7.7)
Neutrophils Relative %: 68 %
PLATELETS: 157 10*3/uL (ref 150–400)
RBC: 3.9 MIL/uL — AB (ref 4.22–5.81)
RDW: 15.1 % (ref 11.5–15.5)
WBC: 5.3 10*3/uL (ref 4.0–10.5)

## 2016-01-22 LAB — BASIC METABOLIC PANEL
ANION GAP: 7 (ref 5–15)
BUN: 18 mg/dL (ref 6–20)
CALCIUM: 9.4 mg/dL (ref 8.9–10.3)
CO2: 31 mmol/L (ref 22–32)
Chloride: 107 mmol/L (ref 101–111)
Creatinine, Ser: 0.9 mg/dL (ref 0.61–1.24)
GFR calc Af Amer: >60 mL/min (ref >60)
GLUCOSE: 104 mg/dL — AB (ref 65–99)
POTASSIUM: 3.7 mmol/L (ref 3.5–5.1)
SODIUM: 145 mmol/L (ref 135–145)

## 2016-01-22 LAB — URINALYSIS, ROUTINE W REFLEX MICROSCOPIC
BILIRUBIN URINE: NEGATIVE
Glucose, UA: NEGATIVE mg/dL
Ketones, ur: NEGATIVE mg/dL
NITRITE: NEGATIVE
PH: 6 (ref 5.0–8.0)
Protein, ur: 100 mg/dL — AB
SPECIFIC GRAVITY, URINE: 1.016 (ref 1.005–1.030)

## 2016-01-22 LAB — URINE MICROSCOPIC-ADD ON

## 2016-01-22 MED ORDER — CIPROFLOXACIN HCL 500 MG PO TABS
500.0000 mg | ORAL_TABLET | Freq: Once | ORAL | Status: AC
  Administered 2016-01-22: 500 mg via ORAL
  Filled 2016-01-22: qty 1

## 2016-01-22 MED ORDER — CIPROFLOXACIN HCL 500 MG PO TABS: 500.0000 mg | ORAL_TABLET | Freq: Two times a day (BID) | ORAL | Status: DC

## 2016-01-22 MED ORDER — DEXTROSE 5 % IV SOLN: 2.0000 g | Freq: Once | INTRAVENOUS | Status: DC

## 2016-01-22 NOTE — Discharge Instructions (Signed)

## 2016-01-22 NOTE — ED Notes (Addendum)
Per daughter pt has been "sluggish" for the last several days, pt came from PCP office for evaluation.  She seems to think he has PNA or a UTI.  C/o cough and congestion, no fever.  Per daughter he is normally talkative and walking without assistance.  Per daughter, pt has a catheter because he "can't urinate on his own, his bladder fills up."  Unsure of cause

## 2016-01-22 NOTE — ED Provider Notes (Signed)
CSN: [id number]     Arrival date & time 01/22/16  1918 History  By signing my name below, I, [name], attest that this documentation has been prepared under the direction and in the presence of Tanna Furry, MD . Electronically Signed: [name], Scribe. 01/22/2016. 9:37 PM.   Chief Complaint  Patient presents with  . Fatigue   The history is provided by a relative. No language interpreter was used.   HPI Comments:  Bradley [name] is a 75 y.o. male with a PMHx of CVA, HTN, HLD, aortic stenosis, CHF, and dementia who presents to the Emergency Department complaining of gradual onset fatigue beginning two weeks ago. Daughter describes pt as being sluggish. Family reports associated nausea, cough, and rhinorrhea onset a few days ago. Pt was referred to ED by PCP earlier today.  Family states the pt's symptoms are similar to those of past UTI or PNA. No alleviating factors noted. Family also reports he finished a course of antibiotics four days ago as treatment for bronchitis dx on Jan 09, 2016 at urgent care. Pt has not had flu vaccine this season. Family denies fever.  Past Medical History  Diagnosis Date  . Stroke ([place])   . Hypertension   . Hyperlipidemia   . Aortic stenosis   . CHF (congestive heart failure) ([place])   . MGUS (monoclonal gammopathy of unknown significance)   . Goiter   . Rhabdomyolysis   . UTI (urinary tract infection) 05/09/2014  . Dementia    Past Surgical History  Procedure Laterality Date  . Circumcision     Family History  Problem Relation Age of Onset  . Family history unknown: Yes   Social History  Substance Use Topics  . Smoking status: Never Smoker   . Smokeless tobacco: Never Used  . Alcohol Use: No    Review of Systems  Constitutional: Positive for fatigue. Negative for fever, chills, diaphoresis and appetite change.  HENT: Positive for rhinorrhea. Negative for mouth sores, sore throat and trouble swallowing.   Eyes: Negative for visual  disturbance.  Respiratory: Positive for cough. Negative for chest tightness, shortness of breath and wheezing.   Cardiovascular: Negative for chest pain.  Gastrointestinal: Positive for nausea. Negative for vomiting, abdominal pain, diarrhea and abdominal distention.  Endocrine: Negative for polydipsia, polyphagia and polyuria.  Genitourinary: Negative for dysuria, frequency and hematuria.  Musculoskeletal: Negative for gait problem.  Skin: Negative for color change, pallor and rash.  Neurological: Negative for dizziness, syncope, light-headedness and headaches.  Hematological: Does not bruise/bleed easily.  Psychiatric/Behavioral: Negative for behavioral problems and confusion.   Allergies  Review of patient's allergies indicates no known allergies.  Home Medications   Prior to Admission medications   Medication Sig Start Date End Date Taking? Authorizing Provider  albuterol (PROVENTIL) (2.5 MG/3ML) 0.083% nebulizer solution Take 3 mLs (2.5 mg total) by nebulization every 4 (four) hours as needed for wheezing or shortness of breath. 04/02/15  Yes Shanker Kristeen Mans, MD  ALPRAZolam Duanne Moron) 0.25 MG tablet Take 0.5-1 tablets by mouth daily as needed for anxiety.  09/11/15  Yes Historical Provider, MD  amLODipine (NORVASC) 10 MG tablet Take 10 mg by mouth daily.   Yes Historical Provider, MD  aspirin 81 MG tablet Take 81 mg by mouth daily.   Yes Historical Provider, MD  atorvastatin (LIPITOR) 10 MG tablet Take 10 mg by mouth daily.   Yes Historical Provider, MD  furosemide (LASIX) 40 MG tablet Take 40 mg by mouth daily. 04/28/15  Yes Historical Provider, MD  hydrALAZINE (APRESOLINE) 100 MG tablet Take 50-100 mg by mouth 2 (two) times daily. Take 100 mg every morning and 50 mg every night. 06/14/15  Yes Historical Provider, MD  ipratropium-albuterol (DUONEB) 0.5-2.5 (3) MG/3ML SOLN Take 3 mLs by nebulization 2 (two) times daily. 04/05/15  Yes Shanker Kristeen Mans, MD  labetalol (NORMODYNE) 300 MG tablet  Take 1 tablet (300 mg total) by mouth 2 (two) times daily. 05/09/14  Yes [name] York, PA-C  Maltodextrin-Xanthan Gum (RESOURCE THICKENUP CLEAR) POWD With every meal 11/12/15  Yes Hosie Poisson, MD  pramoxine (PROCTOFOAM) 1 % foam Place 1 application rectally 3 (three) times daily as needed for itching. 06/19/15  Yes Dorie Rank, MD  amoxicillin-clavulanate (AUGMENTIN) 875-125 MG tablet Take 1 tablet by mouth 2 (two) times daily. 11/12/15   Hosie Poisson, MD  ciprofloxacin (CIPRO) 500 MG tablet Take 1 tablet (500 mg total) by mouth every 12 (twelve) hours. 01/22/16   Tanna Furry, MD  docusate sodium (COLACE) 100 MG capsule Take 1 capsule (100 mg total) by mouth every 12 (twelve) hours. Patient not taking: Reported on 01/22/2016 06/19/15   Dorie Rank, MD   BP 150/74 mmHg  Pulse 75  Temp(Src) 98 F (36.7 C) (Oral)  Resp 16  Ht 5\' 4"  (1.626 m)  Wt 120 lb (54.432 kg)  BMI 20.59 kg/m2  SpO2 95%   Physical Exam  Constitutional: He is oriented to person, place, and time. He appears well-developed and well-nourished. No distress.  HENT:  Head: Normocephalic.  Eyes: Conjunctivae are normal. Pupils are equal, round, and reactive to light. No scleral icterus.  Neck: Normal range of motion. Neck supple. No thyromegaly present.  Cardiovascular: Normal rate and regular rhythm.  Exam reveals no gallop and no friction rub.   No murmur heard. Pulmonary/Chest: Effort normal and breath sounds normal. No respiratory distress. He has no wheezes. He has no rales.  Abdominal: Soft. Bowel sounds are normal. He exhibits no distension. There is no tenderness. There is no rebound.  Musculoskeletal: Normal range of motion.  Neurological: He is alert and oriented to person, place, and time.  Skin: Skin is warm and dry. No rash noted.  Psychiatric: He has a normal mood and affect. His behavior is normal.   ED Course  Procedures  DIAGNOSTIC STUDIES: Oxygen Saturation is 97% on RA, normal by my interpretation.     COORDINATION OF CARE: 8:16 PM Discussed treatment plan with pt at bedside and pt agreed to plan.  Labs Review Labs Reviewed  URINALYSIS, ROUTINE W REFLEX MICROSCOPIC (NOT AT [facility]) - Abnormal; Notable for the following:    APPearance CLOUDY (*)    Hgb urine dipstick MODERATE (*)    Protein, ur 100 (*)    Leukocytes, UA MODERATE (*)    All other components within normal limits  URINE MICROSCOPIC-ADD ON - Abnormal; Notable for the following:    Squamous Epithelial / LPF 6-30 (*)    Bacteria, UA MANY (*)    All other components within normal limits  CBC WITH DIFFERENTIAL/PLATELET - Abnormal; Notable for the following:    RBC 3.90 (*)    Hemoglobin 10.3 (*)    HCT 33.1 (*)    All other components within normal limits  BASIC METABOLIC PANEL - Abnormal; Notable for the following:    Glucose, Bld 104 (*)    All other components within normal limits  URINE CULTURE    Imaging Review Dg Chest 2 View  01/22/2016  CLINICAL  DATA:  Patient is nonverbal. Patient has been sluggish for several days. Fatigue. EXAM: CHEST  2 VIEW COMPARISON:  01/22/2016 FINDINGS: Shallow inspiration. Mild cardiac enlargement without vascular congestion. No focal airspace disease or consolidation in the lungs. No blunting of costophrenic angles. No pneumothorax. Calcified and tortuous aorta. Degenerative changes in the spine. IMPRESSION: Cardiac enlargement.  No evidence of active pulmonary disease. Electronically Signed   By: Lucienne Capers M.D.   On: 01/22/2016 20:32   I have personally reviewed and evaluated these images and lab results as part of my medical decision-making.   EKG Interpretation None     MDM   Final diagnoses:  UTI (lower urinary tract infection)    Patient does not appear septic toxic. Family comfortable with him at home. Given by mouth Cipro crushed in applesauce and he was able to take it without difficulty. Plan is discharge home.  I personally performed the services described in  this documentation, which was scribed in my presence. The recorded information has been reviewed and is accurate.   Tanna Furry, MD 01/22/16 2242

## 2016-01-22 NOTE — ED Notes (Signed)
Pt returned from xray

## 2016-01-24 LAB — URINE CULTURE: Culture: >=100000

## 2016-01-25 ENCOUNTER — Telehealth (HOSPITAL_BASED_OUTPATIENT_CLINIC_OR_DEPARTMENT_OTHER): Payer: Self-pay | Admitting: Emergency Medicine

## 2016-01-25 NOTE — Progress Notes (Signed)
ED Antimicrobial Stewardship Positive Culture Follow Up   Bradley [name] is an 75 y.o. male who presented to [facility] on 01/22/2016 with a chief complaint of  Chief Complaint  Patient presents with  . Fatigue    Recent Results (from the past 720 hour(s))  Urine culture     Status: None   Collection Time: 01/22/16  9:00 PM  Result Value Ref Range Status   Specimen Description URINE, CLEAN CATCH  Final   Special Requests NONE  Final   Culture   Final    >=100,000 COLONIES/mL YEAST Performed at [facility]    Report Status 01/24/2016 FINAL  Final     [x]  Patient discharged originally without antimicrobial agent and treatment is now indicated  New antibiotic prescription: fluconazole 200mg  daily for 2 weeks  ED Provider: Lenn Sink, PA-C   Melburn Popper, PharmD Clinical Pharmacy Resident Pager: [phone] 01/25/2016 9:22 AM

## 2016-01-25 NOTE — Telephone Encounter (Signed)
Post ED Visit - Positive Culture Follow-up: Successful Patient Follow-Up  Culture assessed and recommendations reviewed by: []  Elenor Quinones, Pharm.D. []  Heide Guile, Pharm.D., BCPS []  Parks Neptune, Pharm.D. []  Alycia Rossetti, Pharm.D., BCPS []  [place], [place].D., BCPS, AAHIVP []  Legrand Como, Pharm.D., BCPS, AAHIVP []  Milus Glazier, Pharm.D. []  Stephens November, Pharm.D.  Positive urine culture Yeast  [x]  Patient discharged without antimicrobial prescription and treatment is now indicated []  Organism is resistant to prescribed ED discharge antimicrobial []  Patient with positive blood cultures  Changes discussed with ED provider: Okey Regal PA Called to [place] Fluconazole 200mg  po daily for 2 weeks  01/25/16 1109   Hazle Nordmann 01/25/2016, 10:51 AM

## 2016-02-26 ENCOUNTER — Emergency Department (HOSPITAL_BASED_OUTPATIENT_CLINIC_OR_DEPARTMENT_OTHER)
Admission: EM | Admit: 2016-02-26 | Discharge: 2016-02-26 | Disposition: A | Discharge status: Home / Self Care | Payer: Medicare Other | Attending: Emergency Medicine | Admitting: Emergency Medicine

## 2016-02-26 ENCOUNTER — Encounter (HOSPITAL_BASED_OUTPATIENT_CLINIC_OR_DEPARTMENT_OTHER): Payer: Self-pay | Admitting: *Deleted

## 2016-02-26 DIAGNOSIS — F0390 Unspecified dementia without behavioral disturbance: Secondary | ICD-10-CM | POA: Diagnosis not present

## 2016-02-26 DIAGNOSIS — Z8673 Personal history of transient ischemic attack (TIA), and cerebral infarction without residual deficits: Secondary | ICD-10-CM | POA: Diagnosis not present

## 2016-02-26 DIAGNOSIS — Z7982 Long term (current) use of aspirin: Secondary | ICD-10-CM | POA: Diagnosis not present

## 2016-02-26 DIAGNOSIS — I509 Heart failure, unspecified: Secondary | ICD-10-CM | POA: Diagnosis not present

## 2016-02-26 DIAGNOSIS — Z79899 Other long term (current) drug therapy: Secondary | ICD-10-CM | POA: Insufficient documentation

## 2016-02-26 DIAGNOSIS — Z792 Long term (current) use of antibiotics: Secondary | ICD-10-CM | POA: Insufficient documentation

## 2016-02-26 DIAGNOSIS — R011 Cardiac murmur, unspecified: Secondary | ICD-10-CM | POA: Diagnosis not present

## 2016-02-26 DIAGNOSIS — T83098A Other mechanical complication of other indwelling urethral catheter, initial encounter: Secondary | ICD-10-CM | POA: Diagnosis present

## 2016-02-26 DIAGNOSIS — Y658 Other specified misadventures during surgical and medical care: Secondary | ICD-10-CM | POA: Diagnosis not present

## 2016-02-26 DIAGNOSIS — Z87440 Personal history of urinary (tract) infections: Secondary | ICD-10-CM | POA: Diagnosis not present

## 2016-02-26 DIAGNOSIS — E785 Hyperlipidemia, unspecified: Secondary | ICD-10-CM | POA: Insufficient documentation

## 2016-02-26 DIAGNOSIS — I10 Essential (primary) hypertension: Secondary | ICD-10-CM | POA: Insufficient documentation

## 2016-02-26 DIAGNOSIS — Z8739 Personal history of other diseases of the musculoskeletal system and connective tissue: Secondary | ICD-10-CM | POA: Insufficient documentation

## 2016-02-26 DIAGNOSIS — Z862 Personal history of diseases of the blood and blood-forming organs and certain disorders involving the immune mechanism: Secondary | ICD-10-CM | POA: Diagnosis not present

## 2016-02-26 DIAGNOSIS — T839XXA Unspecified complication of genitourinary prosthetic device, implant and graft, initial encounter: Secondary | ICD-10-CM

## 2016-02-26 NOTE — ED Notes (Signed)
He has a home health nurse that changes his foley once a month. Yesterday he was not able to replace it.

## 2016-02-26 NOTE — ED Provider Notes (Signed)
CSN: [id number]     Arrival date & time 02/26/16  1647 History  By signing my name below, I, Bradley [name], attest that this documentation has been prepared under the direction and in the presence of [name], MD . Electronically Signed: Rowan [name], Scribe. 02/26/2016. 7:55 PM.   Chief Complaint  Patient presents with  . Needs Urinary Catheter insertion    The history is provided by a relative. No language interpreter was used.   HPI Comments:  Bradley [name] is a 75 y.o. male with PMHx of HTN, HLD, and CHF who presents to the Emergency Department seeking urinary catheter insertion. Pt has a home health nurse that usually changes his foley once a month; the nurse attempted to change it today during a routine changing but was unable to do so. ED nurse successfully inserted the foley prior to exam. Pt has no other symptoms or complaints at this time.  Hx obtained from daughter as pt has dementia.  Past Medical History  Diagnosis Date  . Stroke ([place])   . Hypertension   . Hyperlipidemia   . Aortic stenosis   . CHF (congestive heart failure) ([place])   . MGUS (monoclonal gammopathy of unknown significance)   . Goiter   . Rhabdomyolysis   . UTI (urinary tract infection) 05/09/2014  . Dementia    Past Surgical History  Procedure Laterality Date  . Circumcision     Family History  Problem Relation Age of Onset  . Family history unknown: Yes   Social History  Substance Use Topics  . Smoking status: Never Smoker   . Smokeless tobacco: Never Used  . Alcohol Use: No    Review of Systems  Unable to perform ROS: Dementia      Allergies  Review of patient's allergies indicates no known allergies.  Home Medications   Prior to Admission medications   Medication Sig Start Date End Date Taking? Authorizing Provider  albuterol (PROVENTIL) (2.5 MG/3ML) 0.083% nebulizer solution Take 3 mLs (2.5 mg total) by nebulization every 4 (four) hours as needed for wheezing or  shortness of breath. 04/02/15   Shanker Kristeen Mans, MD  ALPRAZolam Duanne Moron) 0.25 MG tablet Take 0.5-1 tablets by mouth daily as needed for anxiety.  09/11/15   Historical Provider, MD  amLODipine (NORVASC) 10 MG tablet Take 10 mg by mouth daily.    Historical Provider, MD  amoxicillin-clavulanate (AUGMENTIN) 875-125 MG tablet Take 1 tablet by mouth 2 (two) times daily. 11/12/15   Hosie Poisson, MD  aspirin 81 MG tablet Take 81 mg by mouth daily.    Historical Provider, MD  atorvastatin (LIPITOR) 10 MG tablet Take 10 mg by mouth daily.    Historical Provider, MD  ciprofloxacin (CIPRO) 500 MG tablet Take 1 tablet (500 mg total) by mouth every 12 (twelve) hours. 01/22/16   Tanna Furry, MD  docusate sodium (COLACE) 100 MG capsule Take 1 capsule (100 mg total) by mouth every 12 (twelve) hours. Patient not taking: Reported on 01/22/2016 06/19/15   Dorie Rank, MD  furosemide (LASIX) 40 MG tablet Take 40 mg by mouth daily. 04/28/15   Historical Provider, MD  hydrALAZINE (APRESOLINE) 100 MG tablet Take 50-100 mg by mouth 2 (two) times daily. Take 100 mg every morning and 50 mg every night. 06/14/15   Historical Provider, MD  ipratropium-albuterol (DUONEB) 0.5-2.5 (3) MG/3ML SOLN Take 3 mLs by nebulization 2 (two) times daily. 04/05/15   Shanker Kristeen Mans, MD  labetalol (NORMODYNE) 300 MG tablet Take  1 tablet (300 mg total) by mouth 2 (two) times daily. 05/09/14   Melton Alar, PA-C  Maltodextrin-Xanthan Gum ([place]) POWD With every meal 11/12/15   Hosie Poisson, MD  pramoxine (PROCTOFOAM) 1 % foam Place 1 application rectally 3 (three) times daily as needed for itching. 06/19/15   Dorie Rank, MD   BP 138/89 mmHg  Pulse 90  Temp(Src) 98.1 F (36.7 C) (Oral)  Resp 16  Ht 5\' 5"  (1.651 m)  Wt 120 lb (54.432 kg)  BMI 19.97 kg/m2  SpO2 98% Physical Exam  Constitutional: He appears well-developed. No distress.  Thin, frail, elderly man  HENT:  Head: Normocephalic and atraumatic.  Moist mucous  membranes  Eyes: Conjunctivae are normal.  Neck: Neck supple.  Cardiovascular: Normal rate and regular rhythm.   Murmur heard. 4/6 systolic murmur  Pulmonary/Chest: Effort normal and breath sounds normal.  Abdominal: Soft. Bowel sounds are normal. He exhibits no distension. There is no tenderness.  Genitourinary:  Foley catheter in place draining clear yellow urine  Musculoskeletal: He exhibits no edema.  Neurological: He is alert.  Fluent speech  Skin: Skin is warm and dry.  Nursing note and vitals reviewed.   ED Course  Procedures DIAGNOSTIC STUDIES:  Oxygen Saturation is 98% on RA, normal by my interpretation.    COORDINATION OF CARE:  7:55 PM Discussed treatment plan with pt at bedside and daughter agreed to plan.    EKG Interpretation None     MDM   Final diagnoses:  Complication of Foley catheter, initial encounter ([place])   Pt with chronic indwelling Foley catheter which was unable to be replaced today by his home health nurse. Foley replaced successfully in the ED with 955ml clear yellow urine output. Patient comfortable at time of discharge. All questions answered.   I personally performed the services described in this documentation, which was scribed in my presence. The recorded information has been reviewed and is accurate.   [name], MD 02/26/16 2014

## 2016-02-29 ENCOUNTER — Ambulatory Visit: Payer: Medicare Other | Admitting: Internal Medicine

## 2016-04-18 ENCOUNTER — Emergency Department (HOSPITAL_BASED_OUTPATIENT_CLINIC_OR_DEPARTMENT_OTHER)
Admission: EM | Admit: 2016-04-18 | Discharge: 2016-04-18 | Disposition: A | Discharge status: Home / Self Care | Payer: Medicare Other | Attending: Emergency Medicine | Admitting: Emergency Medicine

## 2016-04-18 ENCOUNTER — Encounter (HOSPITAL_BASED_OUTPATIENT_CLINIC_OR_DEPARTMENT_OTHER): Payer: Self-pay | Admitting: Emergency Medicine

## 2016-04-18 ENCOUNTER — Emergency Department (HOSPITAL_BASED_OUTPATIENT_CLINIC_OR_DEPARTMENT_OTHER): Payer: Medicare Other

## 2016-04-18 DIAGNOSIS — I110 Hypertensive heart disease with heart failure: Secondary | ICD-10-CM | POA: Diagnosis not present

## 2016-04-18 DIAGNOSIS — R05 Cough: Secondary | ICD-10-CM | POA: Diagnosis present

## 2016-04-18 DIAGNOSIS — E785 Hyperlipidemia, unspecified: Secondary | ICD-10-CM | POA: Diagnosis not present

## 2016-04-18 DIAGNOSIS — I639 Cerebral infarction, unspecified: Secondary | ICD-10-CM | POA: Insufficient documentation

## 2016-04-18 DIAGNOSIS — T83511A Infection and inflammatory reaction due to indwelling urethral catheter, initial encounter: Secondary | ICD-10-CM

## 2016-04-18 DIAGNOSIS — N390 Urinary tract infection, site not specified: Secondary | ICD-10-CM

## 2016-04-18 DIAGNOSIS — I509 Heart failure, unspecified: Secondary | ICD-10-CM | POA: Insufficient documentation

## 2016-04-18 DIAGNOSIS — R059 Cough, unspecified: Secondary | ICD-10-CM

## 2016-04-18 LAB — URINE MICROSCOPIC-ADD ON

## 2016-04-18 LAB — CBC WITH DIFFERENTIAL/PLATELET
Basophils Absolute: 0 10*3/uL (ref 0.0–0.1)
Basophils Relative: 0 %
Eosinophils Absolute: 0 10*3/uL (ref 0.0–0.7)
Eosinophils Relative: 1 %
HCT: 34.3 % — ABNORMAL LOW (ref 39.0–52.0)
Hemoglobin: 11.3 g/dL — ABNORMAL LOW (ref 13.0–17.0)
Lymphocytes Relative: 39 %
Lymphs Abs: 1.1 10*3/uL (ref 0.7–4.0)
MCH: 28.7 pg (ref 26.0–34.0)
MCHC: 32.9 g/dL (ref 30.0–36.0)
MCV: 87.1 fL (ref 78.0–100.0)
Monocytes Absolute: 0.4 10*3/uL (ref 0.1–1.0)
Monocytes Relative: 14 %
Neutro Abs: 1.3 10*3/uL — ABNORMAL LOW (ref 1.7–7.7)
Neutrophils Relative %: 46 %
Platelets: 169 10*3/uL (ref 150–400)
RBC: 3.94 MIL/uL — ABNORMAL LOW (ref 4.22–5.81)
RDW: 13.5 % (ref 11.5–15.5)
WBC: 2.9 10*3/uL — ABNORMAL LOW (ref 4.0–10.5)

## 2016-04-18 LAB — URINALYSIS, ROUTINE W REFLEX MICROSCOPIC
Bilirubin Urine: NEGATIVE
Glucose, UA: NEGATIVE mg/dL
Hgb urine dipstick: NEGATIVE
Ketones, ur: NEGATIVE mg/dL
Nitrite: POSITIVE — AB
Protein, ur: 30 mg/dL — AB
Specific Gravity, Urine: 1.017 (ref 1.005–1.030)
pH: 5 (ref 5.0–8.0)

## 2016-04-18 LAB — COMPREHENSIVE METABOLIC PANEL
ALT: 36 U/L (ref 17–63)
AST: 35 U/L (ref 15–41)
Albumin: 3.5 g/dL (ref 3.5–5.0)
Alkaline Phosphatase: 90 U/L (ref 38–126)
Anion gap: 6 (ref 5–15)
BUN: 21 mg/dL — ABNORMAL HIGH (ref 6–20)
CO2: 30 mmol/L (ref 22–32)
Calcium: 9.7 mg/dL (ref 8.9–10.3)
Chloride: 101 mmol/L (ref 101–111)
Creatinine, Ser: 0.86 mg/dL (ref 0.61–1.24)
GFR calc Af Amer: >60 mL/min (ref >60)
GFR calc non Af Amer: >60 mL/min (ref >60)
Glucose, Bld: 113 mg/dL — ABNORMAL HIGH (ref 65–99)
Potassium: 4.4 mmol/L (ref 3.5–5.1)
Sodium: 137 mmol/L (ref 135–145)
Total Bilirubin: 0.5 mg/dL (ref 0.3–1.2)
Total Protein: 7.2 g/dL (ref 6.5–8.1)

## 2016-04-18 MED ORDER — CETIRIZINE HCL 10 MG PO TABS: 10.0000 mg | ORAL_TABLET | Freq: Every day | ORAL | Status: AC

## 2016-04-18 MED ORDER — BENZONATATE 100 MG PO CAPS: 100.0000 mg | ORAL_CAPSULE | Freq: Three times a day (TID) | ORAL | Status: AC

## 2016-04-18 MED ORDER — CEPHALEXIN 250 MG PO CAPS
500.0000 mg | ORAL_CAPSULE | Freq: Once | ORAL | Status: AC
  Administered 2016-04-18: 500 mg via ORAL
  Filled 2016-04-18: qty 2

## 2016-04-18 MED ORDER — CEPHALEXIN 500 MG PO CAPS: 500.0000 mg | ORAL_CAPSULE | Freq: Four times a day (QID) | ORAL | Status: AC

## 2016-04-18 NOTE — ED Notes (Signed)
Patient was brought in by family because he had had a cough and they were concerned he may have PNA - patient also becoming more and more belligerent and they are concerned he may have a UTI from his indwelling catheter.

## 2016-04-18 NOTE — ED Notes (Addendum)
Daughter states his mental status is unchanged from his norm. She is questioning why I am getting prepared to start an IV. IV will be deferred until after PA exam and if she feels IV is necessary for pts cough.

## 2016-04-18 NOTE — ED Provider Notes (Signed)
CSN: [id number]     Arrival date & time 04/18/16  1807 History   First MD Initiated Contact with Patient 04/18/16 1818     Chief Complaint  Patient presents with  . Cough  . Altered Mental Status     (Consider location/radiation/quality/duration/timing/severity/associated sxs/prior Treatment) HPI Comments: Patient is a 75 year old male with history of CHF and multiple strokes who presents today with cough. Patient has had a productive cough with clear mucus for the past one week. Daughter states that the patient is at his baseline but is just more belligerent than usual. He has been taking Mucinex for the cough with no improvement. Patient has had normal appetite and fluid intake. He denies fever or hemoptysis. Patient denies chest pain, shortness of breath, abdominal pain, nausea, vomiting, sore throat, headache. He denies pain anywhere. Patient has history of strokes with right-sided deficits where he is slower to move his right side, but the patient is at baseline according to family.   Patient is a 75 y.o. male presenting with cough and altered mental status. The history is provided by the patient and a relative.  Cough Associated symptoms: no chest pain, no chills, no fever, no headaches, no rash, no shortness of breath and no sore throat   Altered Mental Status Associated symptoms: no abdominal pain, no fever, no headaches, no nausea, no rash and no vomiting     Past Medical History  Diagnosis Date  . Stroke ([place])   . Hypertension   . Hyperlipidemia   . Aortic stenosis   . CHF (congestive heart failure) ([place])   . MGUS (monoclonal gammopathy of unknown significance)   . Goiter   . Rhabdomyolysis   . UTI (urinary tract infection) 05/09/2014  . Dementia    Past Surgical History  Procedure Laterality Date  . Circumcision     Family History  Problem Relation Age of Onset  . Family history unknown: Yes   Social History  Substance Use Topics  . Smoking status: Never Smoker    . Smokeless tobacco: Never Used  . Alcohol Use: No    Review of Systems  Constitutional: Negative for fever and chills.  HENT: Negative for facial swelling and sore throat.   Respiratory: Positive for cough. Negative for shortness of breath.   Cardiovascular: Negative for chest pain and leg swelling.  Gastrointestinal: Negative for nausea, vomiting and abdominal pain.  Genitourinary: Negative for dysuria.  Musculoskeletal: Negative for back pain.  Skin: Negative for rash and wound.  Neurological: Negative for headaches.  Psychiatric/Behavioral: The patient is not nervous/anxious.       Allergies  Review of patient's allergies indicates no known allergies.  Home Medications   Prior to Admission medications   Medication Sig Start Date End Date Taking? Authorizing Provider  albuterol (PROVENTIL) (2.5 MG/3ML) 0.083% nebulizer solution Take 3 mLs (2.5 mg total) by nebulization every 4 (four) hours as needed for wheezing or shortness of breath. 04/02/15   Shanker Kristeen Mans, MD  ALPRAZolam Duanne Moron) 0.25 MG tablet Take 0.5-1 tablets by mouth daily as needed for anxiety.  09/11/15   Historical Provider, MD  amLODipine (NORVASC) 10 MG tablet Take 10 mg by mouth daily.    Historical Provider, MD  amoxicillin-clavulanate (AUGMENTIN) 875-125 MG tablet Take 1 tablet by mouth 2 (two) times daily. 11/12/15   Hosie Poisson, MD  aspirin 81 MG tablet Take 81 mg by mouth daily.    Historical Provider, MD  atorvastatin (LIPITOR) 10 MG tablet Take 10 mg by mouth  daily.    Historical Provider, MD  benzonatate (TESSALON) 100 MG capsule Take 1 capsule (100 mg total) by mouth every 8 (eight) hours. 04/18/16   Frederica Kuster, PA-C  cephALEXin (KEFLEX) 500 MG capsule Take 1 capsule (500 mg total) by mouth 4 (four) times daily. 04/18/16 04/25/16  Frederica Kuster, PA-C  cetirizine (ZYRTEC ALLERGY) 10 MG tablet Take 1 tablet (10 mg total) by mouth daily. 04/18/16   Frederica Kuster, PA-C  ciprofloxacin (CIPRO) 500 MG  tablet Take 1 tablet (500 mg total) by mouth every 12 (twelve) hours. 01/22/16   Tanna Furry, MD  docusate sodium (COLACE) 100 MG capsule Take 1 capsule (100 mg total) by mouth every 12 (twelve) hours. Patient not taking: Reported on 01/22/2016 06/19/15   Dorie Rank, MD  furosemide (LASIX) 40 MG tablet Take 40 mg by mouth daily. 04/28/15   Historical Provider, MD  hydrALAZINE (APRESOLINE) 100 MG tablet Take 50-100 mg by mouth 2 (two) times daily. Take 100 mg every morning and 50 mg every night. 06/14/15   Historical Provider, MD  ipratropium-albuterol (DUONEB) 0.5-2.5 (3) MG/3ML SOLN Take 3 mLs by nebulization 2 (two) times daily. 04/05/15   Shanker Kristeen Mans, MD  labetalol (NORMODYNE) 300 MG tablet Take 1 tablet (300 mg total) by mouth 2 (two) times daily. 05/09/14   Melton Alar, PA-C  Maltodextrin-Xanthan Gum ([place]) POWD With every meal 11/12/15   Hosie Poisson, MD  pramoxine (PROCTOFOAM) 1 % foam Place 1 application rectally 3 (three) times daily as needed for itching. 06/19/15   Dorie Rank, MD   BP 131/74 mmHg  Pulse 65  Temp(Src) 98.8 F (37.1 C) (Oral)  Resp 18  Ht 5\' 5"  (1.651 m)  Wt 53.524 kg  BMI 19.64 kg/m2  SpO2 99% Physical Exam  Constitutional: He appears well-developed and well-nourished. No distress.  HENT:  Head: Normocephalic and atraumatic.  Mouth/Throat: Oropharynx is clear and moist. No oropharyngeal exudate.  Eyes: Conjunctivae are normal. Pupils are equal, round, and reactive to light. Right eye exhibits no discharge. Left eye exhibits no discharge. No scleral icterus.  Neck: Normal range of motion. Neck supple. No thyromegaly present.  Cardiovascular: Normal rate, regular rhythm and intact distal pulses.  Exam reveals no gallop and no friction rub.   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 Pulmonary/Chest: Effort normal and breath sounds normal. No stridor. No respiratory distress.  He has no wheezes. He has no rales. He exhibits no tenderness.  Abdominal: Soft. Bowel sounds are normal. He exhibits no distension. There is no tenderness. There is no rebound and no guarding.  Musculoskeletal: He exhibits no edema.  Lymphadenopathy:    He has no cervical adenopathy.  Neurological: He is alert. Coordination normal.  Skin: Skin is warm and dry. No rash noted. He is not diaphoretic. No pallor.  Psychiatric: He has a normal mood and affect.  Nursing note and vitals reviewed.   ED Course  Procedures (including critical care time) Labs Review Labs Reviewed  CBC WITH DIFFERENTIAL/PLATELET - Abnormal; Notable for the following:    WBC 2.9 (*)    RBC 3.94 (*)    Hemoglobin 11.3 (*)    HCT 34.3 (*)    Neutro Abs 1.3 (*)    All other components within normal limits  COMPREHENSIVE METABOLIC PANEL - Abnormal; Notable for the following:    Glucose, Bld 113 (*)    BUN 21 (*)  All other components within normal limits  URINALYSIS, ROUTINE W REFLEX MICROSCOPIC (NOT AT [facility]) - Abnormal; Notable for the following:    APPearance CLOUDY (*)    Protein, ur 30 (*)    Nitrite POSITIVE (*)    Leukocytes, UA LARGE (*)    All other components within normal limits  URINE MICROSCOPIC-ADD ON - Abnormal; Notable for the following:    Squamous Epithelial / LPF 0-5 (*)    Bacteria, UA MANY (*)    Casts HYALINE CASTS (*)    All other components within normal limits  URINE CULTURE    Imaging Review Dg Chest 2 View  04/18/2016  CLINICAL DATA:  Cough for 1 week. Altered mental status. Previous stroke, dementia, and congestive heart failure. EXAM: CHEST  2 VIEW COMPARISON:  01/22/2016 FINDINGS: Mild cardiomegaly remains stable. Both lungs are clear. No evidence of pneumothorax or pleural effusion. IMPRESSION: Stable mild cardiomegaly.  No active lung disease. Electronically Signed   By: Earle Gell M.D.   On: 04/18/2016 19:15   I have personally reviewed and evaluated these images and lab  results as part of my medical decision-making.   EKG Interpretation None      MDM   UA shows large leukocytes and positive nitrites, many bacteria and hyaline casts. Urine culture sent. Patient's CBC is unchanged from prior labs. CMP is unremarkable, with the exception of view and 21 which is slightly elevated from past collection. Chest x-ray shows stable mild cardiomegaly, but no active lung disease. Patient given Keflex in ED. Patient discharged home with Keflex, Tessalon, Zyrtec. Patient to follow up with PCP in one week for recheck, or sooner if symptoms are not improving or worsening. Patient vitals stable and in satisfactory condition at discharge. Patient and family understand and are in agreement with plan. Patient discussed with Dr. Jeneen Rinks who is in agreement with plan.  Final diagnoses:  Cough  Urinary tract infection associated with catheterization of urinary tract, initial encounter       Frederica Kuster, PA-C 04/18/16 2046  Tanna Furry, MD 05/02/16 [phone]

## 2016-04-18 NOTE — Discharge Instructions (Signed)
Medications: Keflex, Tessalon, Zyrtec  Treatment: Take Keflex as prescribed for urinary tract infection. Take Tessalon every 8 hours as needed for cough. Take Zyrtec once by mouth daily. Drink lots of fluids to help clear your urinary tract infection.  Follow-up: Please follow-up with your primary care provider, Dr. Rex Kras, in one week or sooner if your symptoms are worsening or not improving. Please return to emergency department if you develop any new or concerning symptoms, such as fever, altered mental status, shortness of breath, chest pain.   Urinary Tract Infection Urinary tract infections (UTIs) can develop anywhere along your urinary tract. Your urinary tract is your body's drainage system for removing wastes and extra water. Your urinary tract includes two kidneys, two ureters, a bladder, and a urethra. Your kidneys are a pair of bean-shaped organs. Each kidney is about the size of your fist. They are located below your ribs, one on each side of your spine. CAUSES Infections are caused by microbes, which are microscopic organisms, including fungi, viruses, and bacteria. These organisms are so small that they can only be seen through a microscope. Bacteria are the microbes that most commonly cause UTIs. SYMPTOMS  Symptoms of UTIs may vary by age and gender of the patient and by the location of the infection. Symptoms in young women typically include a frequent and intense urge to urinate and a painful, burning feeling in the bladder or urethra during urination. Older women and men are more likely to be tired, shaky, and weak and have muscle aches and abdominal pain. A fever may mean the infection is in your kidneys. Other symptoms of a kidney infection include pain in your back or sides below the ribs, nausea, and vomiting. DIAGNOSIS To diagnose a UTI, your caregiver will ask you about your symptoms. Your caregiver will also ask you to provide a urine sample. The urine sample will be tested  for bacteria and white blood cells. White blood cells are made by your body to help fight infection. TREATMENT  Typically, UTIs can be treated with medication. Because most UTIs are caused by a bacterial infection, they usually can be treated with the use of antibiotics. The choice of antibiotic and length of treatment depend on your symptoms and the type of bacteria causing your infection. HOME CARE INSTRUCTIONS  If you were prescribed antibiotics, take them exactly as your caregiver instructs you. Finish the medication even if you feel better after you have only taken some of the medication.  Drink enough water and fluids to keep your urine clear or pale yellow.  Avoid caffeine, tea, and carbonated beverages. They tend to irritate your bladder.  Empty your bladder often. Avoid holding urine for long periods of time.  Empty your bladder before and after sexual intercourse.  After a bowel movement, women should cleanse from front to back. Use each tissue only once. SEEK MEDICAL CARE IF:   You have back pain.  You develop a fever.  Your symptoms do not begin to resolve within 3 days. SEEK IMMEDIATE MEDICAL CARE IF:   You have severe back pain or lower abdominal pain.  You develop chills.  You have nausea or vomiting.  You have continued burning or discomfort with urination. MAKE SURE YOU:   Understand these instructions.  Will watch your condition.  Will get help right away if you are not doing well or get worse.   This information is not intended to replace advice given to you by your health care provider. Make sure  you discuss any questions you have with your health care provider.   Document Released: 09/24/2005 Document Revised: 09/05/2015 Document Reviewed: 01/23/2012 Elsevier Interactive Patient Education 2016 Elsevier Inc.  Cough, Adult Coughing is a reflex that clears your throat and your airways. Coughing helps to heal and protect your lungs. It is normal to  cough occasionally, but a cough that happens with other symptoms or lasts a long time may be a sign of a condition that needs treatment. A cough may last only 2-3 weeks (acute), or it may last longer than 8 weeks (chronic). CAUSES Coughing is commonly caused by:  Breathing in substances that irritate your lungs.  A viral or bacterial respiratory infection.  Allergies.  Asthma.  Postnasal drip.  Smoking.  Acid backing up from the stomach into the esophagus (gastroesophageal reflux).  Certain medicines.  Chronic lung problems, including COPD (or rarely, lung cancer).  Other medical conditions such as heart failure. HOME CARE INSTRUCTIONS  Pay attention to any changes in your symptoms. Take these actions to help with your discomfort:  Take medicines only as told by your health care provider.  If you were prescribed an antibiotic medicine, take it as told by your health care provider. Do not stop taking the antibiotic even if you start to feel better.  Talk with your health care provider before you take a cough suppressant medicine.  Drink enough fluid to keep your urine clear or pale yellow.  If the air is dry, use a cold steam vaporizer or humidifier in your bedroom or your home to help loosen secretions.  Avoid anything that causes you to cough at work or at home.  If your cough is worse at night, try sleeping in a semi-upright position.  Avoid cigarette smoke. If you smoke, quit smoking. If you need help quitting, ask your health care provider.  Avoid caffeine.  Avoid alcohol.  Rest as needed. SEEK MEDICAL CARE IF:   You have new symptoms.  You cough up pus.  Your cough does not get better after 2-3 weeks, or your cough gets worse.  You cannot control your cough with suppressant medicines and you are losing sleep.  You develop pain that is getting worse or pain that is not controlled with pain medicines.  You have a fever.  You have unexplained weight  loss.  You have night sweats. SEEK IMMEDIATE MEDICAL CARE IF:  You cough up blood.  You have difficulty breathing.  Your heartbeat is very fast.   This information is not intended to replace advice given to you by your health care provider. Make sure you discuss any questions you have with your health care provider.   Document Released: 06/13/2011 Document Revised: 09/05/2015 Document Reviewed: 02/21/2015 Elsevier Interactive Patient Education [employer].

## 2016-04-20 LAB — URINE CULTURE

## 2016-05-27 ENCOUNTER — Emergency Department (HOSPITAL_BASED_OUTPATIENT_CLINIC_OR_DEPARTMENT_OTHER)
Admission: EM | Admit: 2016-05-27 | Discharge: 2016-05-27 | Disposition: A | Discharge status: Home / Self Care | Payer: Medicare Other | Source: Home / Self Care | Attending: Emergency Medicine | Admitting: Emergency Medicine

## 2016-05-27 ENCOUNTER — Emergency Department (HOSPITAL_BASED_OUTPATIENT_CLINIC_OR_DEPARTMENT_OTHER): Payer: Medicare Other

## 2016-05-27 DIAGNOSIS — Z8673 Personal history of transient ischemic attack (TIA), and cerebral infarction without residual deficits: Secondary | ICD-10-CM

## 2016-05-27 DIAGNOSIS — E785 Hyperlipidemia, unspecified: Secondary | ICD-10-CM | POA: Insufficient documentation

## 2016-05-27 DIAGNOSIS — I110 Hypertensive heart disease with heart failure: Secondary | ICD-10-CM

## 2016-05-27 DIAGNOSIS — E870 Hyperosmolality and hypernatremia: Secondary | ICD-10-CM | POA: Diagnosis not present

## 2016-05-27 DIAGNOSIS — I509 Heart failure, unspecified: Secondary | ICD-10-CM

## 2016-05-27 DIAGNOSIS — Z7982 Long term (current) use of aspirin: Secondary | ICD-10-CM | POA: Insufficient documentation

## 2016-05-27 DIAGNOSIS — I130 Hypertensive heart and chronic kidney disease with heart failure and stage 1 through stage 4 chronic kidney disease, or unspecified chronic kidney disease: Secondary | ICD-10-CM | POA: Diagnosis not present

## 2016-05-27 DIAGNOSIS — Z79899 Other long term (current) drug therapy: Secondary | ICD-10-CM

## 2016-05-27 DIAGNOSIS — J189 Pneumonia, unspecified organism: Secondary | ICD-10-CM | POA: Insufficient documentation

## 2016-05-27 MED ORDER — DOXYCYCLINE HYCLATE 100 MG PO TABS
100.0000 mg | ORAL_TABLET | Freq: Once | ORAL | Status: AC
  Administered 2016-05-27: 100 mg via ORAL
  Filled 2016-05-27: qty 1

## 2016-05-27 MED ORDER — DOXYCYCLINE HYCLATE 100 MG PO CAPS: 100.0000 mg | ORAL_CAPSULE | Freq: Two times a day (BID) | ORAL | Status: DC

## 2016-05-27 MED FILL — DOXYCYCLINE HYC 100 MG CAP: 100 | 7 days supply | Qty: 14 | Fill #0

## 2016-05-27 NOTE — Discharge Instructions (Signed)

## 2016-05-27 NOTE — ED Notes (Signed)
Daughter states has had intermittant cough that has worsen over the last few days  Along with increased nasal congestion and nasal drainage. Pt has decreased abiltiy to move

## 2016-05-27 NOTE — ED Provider Notes (Signed)
CSN: [id number]     Arrival date & time 05/27/16  0626 History   First MD Initiated Contact with Patient 05/27/16 [phone]     Chief Complaint  Patient presents with  . Cough  . Nasal Congestion     (Consider location/radiation/quality/duration/timing/severity/associated sxs/prior Treatment) HPI   75 y.o. male with a PMHx of CVA, HTN, HLD, aortic stenosis, CHF, and dementia brought in for evaluation by his daughter. He is pleasant, but a poor historian. She reports that over the past day his appetite has not been as good as he is seem to move a little bit slower and has seemed congested. Patient himself has no complaints but his daughter states that she typically doesn't complain even if there is something significant going on. His had a cough but has not seemed short of breath. No unusual swelling. No vomiting. No reported fever.  Past Medical History  Diagnosis Date  . Stroke ([place])   . Hypertension   . Hyperlipidemia   . Aortic stenosis   . CHF (congestive heart failure) ([place])   . MGUS (monoclonal gammopathy of unknown significance)   . Goiter   . Rhabdomyolysis   . UTI (urinary tract infection) 05/09/2014  . Dementia    Past Surgical History  Procedure Laterality Date  . Circumcision     Family History  Problem Relation Age of Onset  . Family history unknown: Yes   Social History  Substance Use Topics  . Smoking status: Never Smoker   . Smokeless tobacco: Never Used  . Alcohol Use: No    Review of Systems  All systems reviewed and negative, other than as noted in HPI.   Allergies  Review of patient's allergies indicates no known allergies.  Home Medications   Prior to Admission medications   Medication Sig Start Date End Date Taking? Authorizing Provider  albuterol (PROVENTIL) (2.5 MG/3ML) 0.083% nebulizer solution Take 3 mLs (2.5 mg total) by nebulization every 4 (four) hours as needed for wheezing or shortness of breath. 04/02/15   Shanker Kristeen Mans, MD   ALPRAZolam Duanne Moron) 0.25 MG tablet Take 0.5-1 tablets by mouth daily as needed for anxiety.  09/11/15   Historical Provider, MD  amLODipine (NORVASC) 10 MG tablet Take 10 mg by mouth daily.    Historical Provider, MD  amoxicillin-clavulanate (AUGMENTIN) 875-125 MG tablet Take 1 tablet by mouth 2 (two) times daily. 11/12/15   Hosie Poisson, MD  aspirin 81 MG tablet Take 81 mg by mouth daily.    Historical Provider, MD  atorvastatin (LIPITOR) 10 MG tablet Take 10 mg by mouth daily.    Historical Provider, MD  benzonatate (TESSALON) 100 MG capsule Take 1 capsule (100 mg total) by mouth every 8 (eight) hours. 04/18/16   Frederica Kuster, PA-C  cetirizine (ZYRTEC ALLERGY) 10 MG tablet Take 1 tablet (10 mg total) by mouth daily. 04/18/16   Frederica Kuster, PA-C  ciprofloxacin (CIPRO) 500 MG tablet Take 1 tablet (500 mg total) by mouth every 12 (twelve) hours. 01/22/16   Tanna Furry, MD  docusate sodium (COLACE) 100 MG capsule Take 1 capsule (100 mg total) by mouth every 12 (twelve) hours. Patient not taking: Reported on 01/22/2016 06/19/15   Dorie Rank, MD  furosemide (LASIX) 40 MG tablet Take 40 mg by mouth daily. 04/28/15   Historical Provider, MD  hydrALAZINE (APRESOLINE) 100 MG tablet Take 50-100 mg by mouth 2 (two) times daily. Take 100 mg every morning and 50 mg every night. 06/14/15   Historical  Provider, MD  ipratropium-albuterol (DUONEB) 0.5-2.5 (3) MG/3ML SOLN Take 3 mLs by nebulization 2 (two) times daily. 04/05/15   Shanker Kristeen Mans, MD  labetalol (NORMODYNE) 300 MG tablet Take 1 tablet (300 mg total) by mouth 2 (two) times daily. 05/09/14   Melton Alar, PA-C  Maltodextrin-Xanthan Gum ([place]) POWD With every meal 11/12/15   Hosie Poisson, MD  pramoxine (PROCTOFOAM) 1 % foam Place 1 application rectally 3 (three) times daily as needed for itching. 06/19/15   Dorie Rank, MD   BP 126/65 mmHg  Pulse 62  Temp(Src) 97.5 F (36.4 C)  Resp 20  SpO2 94% Physical Exam  Constitutional: He  appears well-developed and well-nourished. No distress.  Frail/elderly-appearing male sitting in stretcher. He appears comfortable.  HENT:  Head: Normocephalic and atraumatic.  Eyes: Conjunctivae are normal. Right eye exhibits no discharge. Left eye exhibits no discharge.  Neck: Neck supple.  Cardiovascular: Normal rate and regular rhythm.  Exam reveals no gallop and no friction rub.   Murmur heard. Harsh systolic murmur  Pulmonary/Chest: Effort normal and breath sounds normal. No respiratory distress.  Abdominal: Soft. He exhibits no distension. There is no tenderness.  Genitourinary:  Foley with pale yellow urine  Musculoskeletal: He exhibits no edema or tenderness.  Neurological: He is alert.  Some right-sided weakness which his daughter reports is chronic.  Skin: Skin is warm and dry.  Psychiatric: He has a normal mood and affect. His behavior is normal. Thought content normal.  Nursing note and vitals reviewed.   ED Course  Procedures (including critical care time) Labs Review Labs Reviewed - No data to display  Imaging Review Dg Chest 2 View  05/27/2016  CLINICAL DATA:  Worsening cough EXAM: CHEST  2 VIEW COMPARISON:  04/18/2016 FINDINGS: Cardiac shadow is stable. The lungs are well aerated bilaterally. New left mid and lower lung infiltrate is noted. No bony abnormality is noted. IMPRESSION: New left mid and lower lung infiltrate. Electronically Signed   By: Inez Catalina M.D.   On: 05/27/2016 07:54   I have personally reviewed and evaluated these images and lab results as part of my medical decision-making.   EKG Interpretation   Date/Time:  Tuesday May 27 2016 07:40:54 EDT Ventricular Rate:  63 PR Interval:  409 QRS Duration: 78 QT Interval:  415 QTC Calculation: 425 R Axis:   73 Text Interpretation:  Sinus rhythm Prolonged PR interval Probable anterior  infarct, age indeterminate No significant change since last tracing  Confirmed by Knoblock Singer  MD, [place] ([id number]) on  05/27/2016 7:45:36 AM Also  confirmed by Mccranie Singer  MD, [place] ([id number]), editor WALKER, CCT, [place] ([id number])   on 05/27/2016 7:55:16 AM      MDM   Final diagnoses:  Recurrent pneumonia    75yM with cough/congestion. Consider HF with severe AS. Does not appear volume overloaded. Lungs sounds clear. Clinically doubt. Hx of aspiration pneumonia. Afebrile. No increased WOB. Hx limited as he is not a good historian, but he generally appears pretty comfortable. Will check XR.   CXR does have new infiltrate. Will place on doxy. Despite his age and other underlying medical problems, I do not feel he requires hospitalization at this point. Return precautions discussed with daughter.    Virgel Manifold, MD 05/28/16 [phone]

## 2016-05-29 ENCOUNTER — Encounter (HOSPITAL_COMMUNITY): Payer: Self-pay | Admitting: Emergency Medicine

## 2016-05-29 ENCOUNTER — Emergency Department (HOSPITAL_COMMUNITY): Payer: Medicare Other

## 2016-05-29 ENCOUNTER — Inpatient Hospital Stay (HOSPITAL_COMMUNITY)
Admission: EM | Admit: 2016-05-29 | Discharge: 2016-05-31 | DRG: 291 | Disposition: A | Discharge status: Home Health | Payer: Medicare Other | Attending: Internal Medicine | Admitting: Internal Medicine

## 2016-05-29 DIAGNOSIS — I10 Essential (primary) hypertension: Secondary | ICD-10-CM | POA: Diagnosis not present

## 2016-05-29 DIAGNOSIS — Z8673 Personal history of transient ischemic attack (TIA), and cerebral infarction without residual deficits: Secondary | ICD-10-CM

## 2016-05-29 DIAGNOSIS — R64 Cachexia: Secondary | ICD-10-CM | POA: Diagnosis present

## 2016-05-29 DIAGNOSIS — Z79899 Other long term (current) drug therapy: Secondary | ICD-10-CM | POA: Diagnosis not present

## 2016-05-29 DIAGNOSIS — N189 Chronic kidney disease, unspecified: Secondary | ICD-10-CM | POA: Diagnosis not present

## 2016-05-29 DIAGNOSIS — Z7982 Long term (current) use of aspirin: Secondary | ICD-10-CM | POA: Diagnosis not present

## 2016-05-29 DIAGNOSIS — I350 Nonrheumatic aortic (valve) stenosis: Secondary | ICD-10-CM

## 2016-05-29 DIAGNOSIS — Z681 Body mass index (BMI) 19 or less, adult: Secondary | ICD-10-CM

## 2016-05-29 DIAGNOSIS — E876 Hypokalemia: Secondary | ICD-10-CM | POA: Diagnosis present

## 2016-05-29 DIAGNOSIS — N183 Chronic kidney disease, stage 3 unspecified: Secondary | ICD-10-CM | POA: Diagnosis present

## 2016-05-29 DIAGNOSIS — F0390 Unspecified dementia without behavioral disturbance: Secondary | ICD-10-CM | POA: Diagnosis present

## 2016-05-29 DIAGNOSIS — I130 Hypertensive heart and chronic kidney disease with heart failure and stage 1 through stage 4 chronic kidney disease, or unspecified chronic kidney disease: Secondary | ICD-10-CM | POA: Diagnosis present

## 2016-05-29 DIAGNOSIS — D472 Monoclonal gammopathy: Secondary | ICD-10-CM | POA: Diagnosis present

## 2016-05-29 DIAGNOSIS — J189 Pneumonia, unspecified organism: Secondary | ICD-10-CM | POA: Diagnosis present

## 2016-05-29 DIAGNOSIS — R0600 Dyspnea, unspecified: Secondary | ICD-10-CM | POA: Diagnosis present

## 2016-05-29 DIAGNOSIS — E785 Hyperlipidemia, unspecified: Secondary | ICD-10-CM | POA: Diagnosis present

## 2016-05-29 DIAGNOSIS — N2889 Other specified disorders of kidney and ureter: Secondary | ICD-10-CM | POA: Diagnosis present

## 2016-05-29 DIAGNOSIS — E870 Hyperosmolality and hypernatremia: Secondary | ICD-10-CM | POA: Diagnosis present

## 2016-05-29 DIAGNOSIS — R0603 Acute respiratory distress: Secondary | ICD-10-CM

## 2016-05-29 DIAGNOSIS — I5033 Acute on chronic diastolic (congestive) heart failure: Secondary | ICD-10-CM | POA: Diagnosis present

## 2016-05-29 DIAGNOSIS — I639 Cerebral infarction, unspecified: Secondary | ICD-10-CM | POA: Diagnosis present

## 2016-05-29 LAB — BASIC METABOLIC PANEL
Anion gap: 9 (ref 5–15)
BUN: 43 mg/dL — ABNORMAL HIGH (ref 6–20)
CALCIUM: 11 mg/dL — AB (ref 8.9–10.3)
CHLORIDE: 111 mmol/L (ref 101–111)
CO2: 31 mmol/L (ref 22–32)
CREATININE: 1.02 mg/dL (ref 0.61–1.24)
GFR calc Af Amer: >60 mL/min (ref >60)
GFR calc non Af Amer: >60 mL/min (ref >60)
GLUCOSE: 129 mg/dL — AB (ref 65–99)
Potassium: 3.2 mmol/L — ABNORMAL LOW (ref 3.5–5.1)
Sodium: 151 mmol/L — ABNORMAL HIGH (ref 135–145)

## 2016-05-29 LAB — CBC WITH DIFFERENTIAL/PLATELET
Basophils Absolute: 0 10*3/uL (ref 0.0–0.1)
Basophils Relative: 0 %
Eosinophils Absolute: 0 10*3/uL (ref 0.0–0.7)
Eosinophils Relative: 0 %
HEMATOCRIT: 34.9 % — AB (ref 39.0–52.0)
Hemoglobin: 10.7 g/dL — ABNORMAL LOW (ref 13.0–17.0)
LYMPHS ABS: 0.5 10*3/uL — AB (ref 0.7–4.0)
LYMPHS PCT: 7 %
MCH: 26.6 pg (ref 26.0–34.0)
MCHC: 30.7 g/dL (ref 30.0–36.0)
MCV: 86.6 fL (ref 78.0–100.0)
MONO ABS: 0.5 10*3/uL (ref 0.1–1.0)
MONOS PCT: 8 %
NEUTROS ABS: 5.6 10*3/uL (ref 1.7–7.7)
Neutrophils Relative %: 85 %
Platelets: 145 10*3/uL — ABNORMAL LOW (ref 150–400)
RBC: 4.03 MIL/uL — ABNORMAL LOW (ref 4.22–5.81)
RDW: 13.3 % (ref 11.5–15.5)
WBC: 6.6 10*3/uL (ref 4.0–10.5)

## 2016-05-29 LAB — BRAIN NATRIURETIC PEPTIDE: B Natriuretic Peptide: >4500 pg/mL — ABNORMAL HIGH (ref 0.0–100.0)

## 2016-05-29 LAB — I-STAT CG4 LACTIC ACID, ED: Lactic Acid, Venous: 2.26 mmol/L (ref 0.5–2.0)

## 2016-05-29 LAB — LACTIC ACID, PLASMA: LACTIC ACID, VENOUS: 1.2 mmol/L (ref 0.5–2.0)

## 2016-05-29 MED ORDER — POTASSIUM CHLORIDE CRYS ER 20 MEQ PO TBCR: 40.0000 meq | EXTENDED_RELEASE_TABLET | Freq: Once | ORAL | Status: DC

## 2016-05-29 MED ORDER — BENZONATATE 100 MG PO CAPS
100.0000 mg | ORAL_CAPSULE | Freq: Three times a day (TID) | ORAL | Status: DC
  Administered 2016-05-29 – 2016-05-31 (×5): 100 mg via ORAL
  Filled 2016-05-29 (×6): qty 1

## 2016-05-29 MED ORDER — DEXTROSE 5 % IV SOLN
1.0000 g | INTRAVENOUS | Status: DC
  Administered 2016-05-30: 1 g via INTRAVENOUS
  Filled 2016-05-29 (×2): qty 10

## 2016-05-29 MED ORDER — AMLODIPINE BESYLATE 5 MG PO TABS
10.0000 mg | ORAL_TABLET | Freq: Every day | ORAL | Status: DC
  Administered 2016-05-30 – 2016-05-31 (×2): 10 mg via ORAL
  Filled 2016-05-29 (×2): qty 2

## 2016-05-29 MED ORDER — IPRATROPIUM-ALBUTEROL 0.5-2.5 (3) MG/3ML IN SOLN
3.0000 mL | Freq: Four times a day (QID) | RESPIRATORY_TRACT | Status: DC
  Administered 2016-05-29: 3 mL via RESPIRATORY_TRACT
  Filled 2016-05-29: qty 3

## 2016-05-29 MED ORDER — HYDRALAZINE HCL 100 MG PO TABS
50.0000 mg | ORAL_TABLET | Freq: Two times a day (BID) | ORAL | Status: DC
Start: 2016-05-29 — End: 2016-05-29

## 2016-05-29 MED ORDER — ACETAMINOPHEN 325 MG PO TABS: 650.0000 mg | ORAL_TABLET | Freq: Four times a day (QID) | ORAL | Status: DC | PRN

## 2016-05-29 MED ORDER — RESOURCE THICKENUP CLEAR PO POWD
ORAL | Status: DC | PRN
  Filled 2016-05-29: qty 125

## 2016-05-29 MED ORDER — SODIUM CHLORIDE 0.9 % IV SOLN
Freq: Once | INTRAVENOUS | Status: AC
  Administered 2016-05-29: 12:00:00 via INTRAVENOUS

## 2016-05-29 MED ORDER — ALBUTEROL SULFATE (2.5 MG/3ML) 0.083% IN NEBU: 2.5000 mg | INHALATION_SOLUTION | RESPIRATORY_TRACT | Status: DC | PRN

## 2016-05-29 MED ORDER — LABETALOL HCL 200 MG PO TABS
300.0000 mg | ORAL_TABLET | Freq: Two times a day (BID) | ORAL | Status: DC
  Administered 2016-05-29 – 2016-05-31 (×4): 300 mg via ORAL
  Filled 2016-05-29 (×4): qty 2

## 2016-05-29 MED ORDER — ATORVASTATIN CALCIUM 10 MG PO TABS
10.0000 mg | ORAL_TABLET | Freq: Every day | ORAL | Status: DC
  Administered 2016-05-29 – 2016-05-31 (×3): 10 mg via ORAL
  Filled 2016-05-29 (×3): qty 1

## 2016-05-29 MED ORDER — IPRATROPIUM-ALBUTEROL 0.5-2.5 (3) MG/3ML IN SOLN: 3.0000 mL | Freq: Two times a day (BID) | RESPIRATORY_TRACT | Status: DC

## 2016-05-29 MED ORDER — PRAMOXINE HCL 1 % RE FOAM
1.0000 "application " | Freq: Three times a day (TID) | RECTAL | Status: DC | PRN
  Filled 2016-05-29: qty 15

## 2016-05-29 MED ORDER — ALPRAZOLAM 0.25 MG PO TABS
0.1250 mg | ORAL_TABLET | Freq: Every day | ORAL | Status: DC | PRN
  Administered 2016-05-31: 0.25 mg via ORAL
  Filled 2016-05-29: qty 1

## 2016-05-29 MED ORDER — DOCUSATE SODIUM 100 MG PO CAPS: 100.0000 mg | ORAL_CAPSULE | Freq: Two times a day (BID) | ORAL | Status: DC

## 2016-05-29 MED ORDER — HYDRALAZINE HCL 50 MG PO TABS
50.0000 mg | ORAL_TABLET | Freq: Every day | ORAL | Status: DC
  Administered 2016-05-29 – 2016-05-30 (×2): 50 mg via ORAL
  Filled 2016-05-29 (×2): qty 1

## 2016-05-29 MED ORDER — ACETAMINOPHEN 650 MG RE SUPP: 650.0000 mg | Freq: Four times a day (QID) | RECTAL | Status: DC | PRN

## 2016-05-29 MED ORDER — ONDANSETRON HCL 4 MG PO TABS
4.0000 mg | ORAL_TABLET | Freq: Four times a day (QID) | ORAL | Status: DC | PRN
Start: 2016-05-29 — End: 2016-05-31

## 2016-05-29 MED ORDER — ASPIRIN 81 MG PO CHEW
81.0000 mg | CHEWABLE_TABLET | Freq: Every day | ORAL | Status: DC
  Administered 2016-05-30 – 2016-05-31 (×2): 81 mg via ORAL
  Filled 2016-05-29 (×3): qty 1

## 2016-05-29 MED ORDER — FUROSEMIDE 20 MG PO TABS
40.0000 mg | ORAL_TABLET | Freq: Every day | ORAL | Status: DC
Start: 2016-05-29 — End: 2016-05-30

## 2016-05-29 MED ORDER — DEXTROSE 5 % IV SOLN
500.0000 mg | Freq: Once | INTRAVENOUS | Status: AC
  Administered 2016-05-29: 500 mg via INTRAVENOUS
  Filled 2016-05-29: qty 500

## 2016-05-29 MED ORDER — ENOXAPARIN SODIUM 40 MG/0.4ML ~~LOC~~ SOLN: 40.0000 mg | SUBCUTANEOUS | Status: DC

## 2016-05-29 MED ORDER — IPRATROPIUM-ALBUTEROL 0.5-2.5 (3) MG/3ML IN SOLN
3.0000 mL | Freq: Two times a day (BID) | RESPIRATORY_TRACT | Status: DC
  Administered 2016-05-30: 3 mL via RESPIRATORY_TRACT
  Filled 2016-05-29: qty 3

## 2016-05-29 MED ORDER — DEXTROSE 5 % IV SOLN
500.0000 mg | INTRAVENOUS | Status: DC
  Administered 2016-05-30: 500 mg via INTRAVENOUS
  Filled 2016-05-29: qty 500

## 2016-05-29 MED ORDER — HYDRALAZINE HCL 50 MG PO TABS
100.0000 mg | ORAL_TABLET | Freq: Every day | ORAL | Status: DC
  Administered 2016-05-30 – 2016-05-31 (×2): 100 mg via ORAL
  Filled 2016-05-29 (×2): qty 2

## 2016-05-29 MED ORDER — ALBUTEROL SULFATE (2.5 MG/3ML) 0.083% IN NEBU
5.0000 mg | INHALATION_SOLUTION | Freq: Once | RESPIRATORY_TRACT | Status: AC
  Administered 2016-05-29: 5 mg via RESPIRATORY_TRACT
  Filled 2016-05-29: qty 6

## 2016-05-29 MED ORDER — ONDANSETRON HCL 4 MG/2ML IJ SOLN: 4.0000 mg | Freq: Four times a day (QID) | INTRAMUSCULAR | Status: DC | PRN

## 2016-05-29 MED ORDER — BENZONATATE 100 MG PO CAPS: 100.0000 mg | ORAL_CAPSULE | Freq: Three times a day (TID) | ORAL | Status: DC

## 2016-05-29 MED ORDER — ENOXAPARIN SODIUM 40 MG/0.4ML ~~LOC~~ SOLN
40.0000 mg | SUBCUTANEOUS | Status: DC
  Filled 2016-05-29: qty 0.4

## 2016-05-29 MED ORDER — DEXTROSE 5 % IV SOLN
1.0000 g | Freq: Once | INTRAVENOUS | Status: AC
  Administered 2016-05-29: 1 g via INTRAVENOUS
  Filled 2016-05-29: qty 10

## 2016-05-29 NOTE — ED Notes (Signed)
Pt arrives from home via [facility].  EMS reports family states pt had increased work of breathing, pursed lip breathing this morning.  Pt recently diagnosed with PNA.  Pt has hx COPD, stroke with L sided deficits and dementia.  Pt denies CP.

## 2016-05-29 NOTE — ED Notes (Signed)
Dr. Kathrynn Humble MD at bedside.

## 2016-05-29 NOTE — H&P (Signed)
History and Physical  Bradley [name] [id number] DOB: 1941-08-26 DOA: 05/29/2016   PCP: [name], MD  Patient coming from: Home, is taken care of by family. Chief Complaint: SOB   HPI: Bradley [name] is a 75 y.o. male with medical history significant of prior CVA, HTN, stage III CKD and recent dx of PNA as an outpatient on 05/27/16 who is being admitted for shortness of breath. Most of the history is taken from the patient's daughter via phone as the patient has significant slurring of his speech chronically. Apparently the patient was in his usual state of health until 2 days ago when he had cough, shortness of breath. Was evaluated at [facility] on 5/30 and found to have PNA, for which he was started on Doxycycline PO. He has been taking the medication but today had sudden onset of shortness of breath. Patient and daughter deny chest pain, fevers. He does have a productive cough. No pleuritic chest pain. No sick contacts. No recent changes in medications or diet. Patient endorses worsening shortness of breath when he lies back.  ED Course: In the ER, chest XR demonstrated worsening left sided PNA. He was given gentle IVF, as well as abx and hospitalist group was consulted for admission.  Review of Systems: Please see pertinent ROS in HPI above.  Review of systems are otherwise negative.  Past Medical History  Diagnosis Date  . Stroke ([place])   . Hypertension   . Hyperlipidemia   . Aortic stenosis   . CHF (congestive heart failure) ([place])   . MGUS (monoclonal gammopathy of unknown significance)   . Goiter   . Rhabdomyolysis   . UTI (urinary tract infection) 05/09/2014  . Dementia    Past Surgical History  Procedure Laterality Date  . Circumcision      Social History:  reports that he has never smoked. He has never used smokeless tobacco. He reports that he does not drink alcohol or use illicit drugs.   No Known Allergies  Family History  Problem Relation Age of Onset  . Family  history unknown: Yes     Prior to Admission medications   Medication Sig Start Date End Date Taking? Authorizing Provider  albuterol (PROVENTIL) (2.5 MG/3ML) 0.083% nebulizer solution Take 3 mLs (2.5 mg total) by nebulization every 4 (four) hours as needed for wheezing or shortness of breath. 04/02/15  Yes Shanker Kristeen Mans, MD  ALPRAZolam Duanne Moron) 0.25 MG tablet Take 0.5-1 tablets by mouth daily as needed for anxiety.  09/11/15  Yes Historical Provider, MD  amLODipine (NORVASC) 10 MG tablet Take 10 mg by mouth daily.   Yes Historical Provider, MD  aspirin 81 MG tablet Take 81 mg by mouth daily.   Yes Historical Provider, MD  atorvastatin (LIPITOR) 10 MG tablet Take 10 mg by mouth daily.   Yes Historical Provider, MD  benzonatate (TESSALON) 100 MG capsule Take 1 capsule (100 mg total) by mouth every 8 (eight) hours. 04/18/16  Yes Alexandra M Law, PA-C  cetirizine (ZYRTEC ALLERGY) 10 MG tablet Take 1 tablet (10 mg total) by mouth daily. 04/18/16  Yes Alexandra M Law, PA-C  docusate sodium (COLACE) 100 MG capsule Take 1 capsule (100 mg total) by mouth every 12 (twelve) hours. 06/19/15  Yes Dorie Rank, MD  doxycycline (VIBRAMYCIN) 100 MG capsule Take 1 capsule (100 mg total) by mouth 2 (two) times daily. 05/27/16  Yes Virgel Manifold, MD  furosemide (LASIX) 40 MG tablet Take 40 mg by mouth daily. 04/28/15  Yes Historical Provider, MD  hydrALAZINE (APRESOLINE) 100 MG tablet Take 50-100 mg by mouth 2 (two) times daily. Take 100 mg every morning and 50 mg every night. 06/14/15  Yes Historical Provider, MD  ipratropium-albuterol (DUONEB) 0.5-2.5 (3) MG/3ML SOLN Take 3 mLs by nebulization 2 (two) times daily. 04/05/15  Yes Shanker Kristeen Mans, MD  labetalol (NORMODYNE) 300 MG tablet Take 1 tablet (300 mg total) by mouth 2 (two) times daily. 05/09/14  Yes [name] York, PA-C  Maltodextrin-Xanthan Gum (RESOURCE THICKENUP CLEAR) POWD With every meal 11/12/15  Yes Hosie Poisson, MD  pramoxine (PROCTOFOAM) 1 % foam Place 1  application rectally 3 (three) times daily as needed for itching. 06/19/15  Yes Dorie Rank, MD  amoxicillin-clavulanate (AUGMENTIN) 875-125 MG tablet Take 1 tablet by mouth 2 (two) times daily. 11/12/15   Hosie Poisson, MD  ciprofloxacin (CIPRO) 500 MG tablet Take 1 tablet (500 mg total) by mouth every 12 (twelve) hours. 01/22/16   Tanna Furry, MD    Physical Exam: BP 118/75 mmHg  Pulse 67  Temp(Src) 97.1 F (36.2 C) (Oral)  Resp 22  SpO2 95%  General:  Alert, oriented, calm, in no acute distress; very cachectic Eyes: pupils round and reactive to light and accomodation, clear sclerea Neck: supple, no masses, trachea mildline  Cardiovascular: RRR, systolic murmur, no rub, no peripheral edema  Respiratory: diminished breath sounds, crackles at bases, no wheezing Abdomen: soft, nontender, nondistended, normal bowel tones heard, scaphoid Skin: dry, no rashes  Musculoskeletal: no joint effusions, normal range of motion  Psychiatric: appropriate affect, normal speech  Neurologic: extraocular muscles intact, clear speech, moving all extremities with intact sensorium            Labs on Admission:  Basic Metabolic Panel:  Recent Labs Lab 05/29/16 1007  NA 151*  K 3.2*  CL 111  CO2 31  GLUCOSE 129*  BUN 43*  CREATININE 1.02  CALCIUM 11.0*   Liver Function Tests: No results for input(s): AST, ALT, ALKPHOS, BILITOT, PROT, ALBUMIN in the last 168 hours. No results for input(s): LIPASE, AMYLASE in the last 168 hours. No results for input(s): AMMONIA in the last 168 hours. CBC:  Recent Labs Lab 05/29/16 1007  WBC 6.6  NEUTROABS 5.6  HGB 10.7*  HCT 34.9*  MCV 86.6  PLT 145*   Cardiac Enzymes: No results for input(s): CKTOTAL, CKMB, CKMBINDEX, TROPONINI in the last 168 hours.  BNP (last 3 results)  Recent Labs  05/29/16 1007  BNP >4500.0*    ProBNP (last 3 results) No results for input(s): PROBNP in the last 8760 hours.  CBG: No results for input(s): GLUCAP in the  last 168 hours.  Radiological Exams on Admission: Dg Chest 2 View  05/29/2016  CLINICAL DATA:  Patient with shortness of breath and history of CHF. EXAM: CHEST  2 VIEW COMPARISON:  Chest radiograph 05/27/2016 FINDINGS: Interval worsening left mid and lower lung pulmonary consolidation. Probable small left pleural effusion. Stable enlarged cardiac and mediastinal contours. No pneumothorax. Thoracic spine degenerative changes. IMPRESSION: Interval worsening left mid lower lung pulmonary consolidation concerning for pneumonia. Followup PA and lateral chest X-ray is recommended in 3-4 weeks following trial of antibiotic therapy to ensure resolution and exclude underlying malignancy. Electronically Signed   By: Lovey Newcomer M.D.   On: 05/29/2016 09:29    EKG: Independently reviewed.    Assessment/Plan Present on Admission:  . Acute dyspnea . CAP (community acquired pneumonia) . Hypertension . Chronic renal insufficiency, stage III (moderate) .  Stroke, small vessel (HCC) Active Problems:  Acute dyspnea - likely combination of PNA as well as fluid overload from known diastolic heart failure and aortic stenosis, given elevated BNP. - was given IVF due to elevated lactate - will check TTE - treat PNA as below - consider IV lasix administration once acute infection is addressed, depending on vitals  CAP (community acquired pneumonia) - with cough, sob, and worsening CXR - agree with IV Azithromycin and rocephin - pulmonary toilet - follow CXR in AM    Stroke, small vessel ([place]) - cont supp care - will consult SLP given hx dysphagia - consult RD given cachexia    Hypertension - cont home meds   Chronic renal insufficiency, stage III (moderate) - follow daily    Severe aortic stenosis    DVT prophylaxis: Lovenox ppx   Code Status: FULL   Family Communication: Discussed with daughter on the phone, as well as two family members/ care givers in the room in ER this AM.   Disposition  Plan: Likely to home with [facility].   Consults called: None   Admission status: Inpt telemetry   Time spent: 41   Cosby Proby Marry Guan MD [facility] Pager [phone]  If 7PM-7AM, please contact night-coverage [email or web address] Password [email or web address]  05/29/2016, 12:22 PM

## 2016-05-29 NOTE — ED Provider Notes (Signed)
CSN: [id number]     Arrival date & time 05/29/16  [id number] History   First MD Initiated Contact with Patient 05/29/16 [phone]     Chief Complaint  Patient presents with  . Shortness of Breath     (Consider location/radiation/quality/duration/timing/severity/associated sxs/prior Treatment) HPI Comments: Pt comes in with c c of DIB. Pt with h/o hypertension, hyperlipidemia, stroke, MGUS, Aortic Stenosis. Pt was seen in the ER yday, diagnosed with CAP and sent home with doxycycline. Pt's daughter reports that pt got dyspneic this AM, and so he was brought to the ER. Pt has cough - mostly clear. No n/v/f/c. Pt has no known hx of aspiration. He denies new leg swelling. Pt doesn't ambulate. Pt has no chest pain. Pt has no hx of PE, DVT and denies any exogenous hormone use, long distance travels or surgery in the past 6 weeks, active cancer, recent immobilization.    ROS 10 Systems reviewed and are negative for acute change except as noted in the HPI.     Patient is a 75 y.o. male presenting with shortness of breath. The history is provided by the patient.  Shortness of Breath   Past Medical History  Diagnosis Date  . Stroke ([place])   . Hypertension   . Hyperlipidemia   . Aortic stenosis   . CHF (congestive heart failure) ([place])   . MGUS (monoclonal gammopathy of unknown significance)   . Goiter   . Rhabdomyolysis   . UTI (urinary tract infection) 05/09/2014  . Dementia    Past Surgical History  Procedure Laterality Date  . Circumcision     Family History  Problem Relation Age of Onset  . Family history unknown: Yes   Social History  Substance Use Topics  . Smoking status: Never Smoker   . Smokeless tobacco: Never Used  . Alcohol Use: No    Review of Systems  Respiratory: Positive for shortness of breath.       Allergies  Review of patient's allergies indicates no known allergies.  Home Medications   Prior to Admission medications   Medication Sig Start Date End Date  Taking? Authorizing Provider  albuterol (PROVENTIL) (2.5 MG/3ML) 0.083% nebulizer solution Take 3 mLs (2.5 mg total) by nebulization every 4 (four) hours as needed for wheezing or shortness of breath. 04/02/15  Yes Shanker Kristeen Mans, MD  ALPRAZolam Duanne Moron) 0.25 MG tablet Take 0.5-1 tablets by mouth daily as needed for anxiety.  09/11/15  Yes Historical Provider, MD  amLODipine (NORVASC) 10 MG tablet Take 10 mg by mouth daily.   Yes Historical Provider, MD  aspirin 81 MG tablet Take 81 mg by mouth daily.   Yes Historical Provider, MD  atorvastatin (LIPITOR) 10 MG tablet Take 10 mg by mouth daily.   Yes Historical Provider, MD  benzonatate (TESSALON) 100 MG capsule Take 1 capsule (100 mg total) by mouth every 8 (eight) hours. 04/18/16  Yes Alexandra M Law, PA-C  cetirizine (ZYRTEC ALLERGY) 10 MG tablet Take 1 tablet (10 mg total) by mouth daily. 04/18/16  Yes Alexandra M Law, PA-C  docusate sodium (COLACE) 100 MG capsule Take 1 capsule (100 mg total) by mouth every 12 (twelve) hours. 06/19/15  Yes Dorie Rank, MD  doxycycline (VIBRAMYCIN) 100 MG capsule Take 1 capsule (100 mg total) by mouth 2 (two) times daily. 05/27/16  Yes Virgel Manifold, MD  furosemide (LASIX) 40 MG tablet Take 40 mg by mouth daily. 04/28/15  Yes Historical Provider, MD  hydrALAZINE (APRESOLINE) 100 MG tablet Take 50-100  mg by mouth 2 (two) times daily. Take 100 mg every morning and 50 mg every night. 06/14/15  Yes Historical Provider, MD  ipratropium-albuterol (DUONEB) 0.5-2.5 (3) MG/3ML SOLN Take 3 mLs by nebulization 2 (two) times daily. 04/05/15  Yes Shanker Kristeen Mans, MD  labetalol (NORMODYNE) 300 MG tablet Take 1 tablet (300 mg total) by mouth 2 (two) times daily. 05/09/14  Yes [name] York, PA-C  Maltodextrin-Xanthan Gum (RESOURCE THICKENUP CLEAR) POWD With every meal 11/12/15  Yes Hosie Poisson, MD  pramoxine (PROCTOFOAM) 1 % foam Place 1 application rectally 3 (three) times daily as needed for itching. 06/19/15  Yes Dorie Rank, MD   amoxicillin-clavulanate (AUGMENTIN) 875-125 MG tablet Take 1 tablet by mouth 2 (two) times daily. 11/12/15   Hosie Poisson, MD  ciprofloxacin (CIPRO) 500 MG tablet Take 1 tablet (500 mg total) by mouth every 12 (twelve) hours. 01/22/16   Tanna Furry, MD   BP 118/75 mmHg  Pulse 67  Temp(Src) 97.1 F (36.2 C) (Oral)  Resp 22  SpO2 95% Physical Exam  Constitutional: He is oriented to person, place, and time. He appears well-developed.  HENT:  Head: Normocephalic and atraumatic.  Eyes: Conjunctivae and EOM are normal. Pupils are equal, round, and reactive to light.  Neck: Normal range of motion. Neck supple.  Cardiovascular: Normal rate and regular rhythm.   Murmur heard. Pulmonary/Chest: Effort normal and breath sounds normal. No respiratory distress. He has no wheezes.  L base rales more pronounced  Abdominal: Soft. Bowel sounds are normal. He exhibits no distension. There is no tenderness. There is no rebound and no guarding.  Neurological: He is alert and oriented to person, place, and time.  Skin: Skin is warm.  Nursing note and vitals reviewed.   ED Course  Procedures (including critical care time) Labs Review Labs Reviewed  CBC WITH DIFFERENTIAL/PLATELET - Abnormal; Notable for the following:    RBC 4.03 (*)    Hemoglobin 10.7 (*)    HCT 34.9 (*)    Platelets 145 (*)    Lymphs Abs 0.5 (*)    All other components within normal limits  BASIC METABOLIC PANEL - Abnormal; Notable for the following:    Sodium 151 (*)    Potassium 3.2 (*)    Glucose, Bld 129 (*)    BUN 43 (*)    Calcium 11.0 (*)    All other components within normal limits  BRAIN NATRIURETIC PEPTIDE - Abnormal; Notable for the following:    B Natriuretic Peptide >4500.0 (*)    All other components within normal limits  I-STAT CG4 LACTIC ACID, ED - Abnormal; Notable for the following:    Lactic Acid, Venous 2.26 (*)    All other components within normal limits  CULTURE, BLOOD (ROUTINE X 2)  CULTURE, BLOOD  (ROUTINE X 2)  CBC  CREATININE, SERUM  LACTIC ACID, PLASMA    Imaging Review Dg Chest 2 View  05/29/2016  CLINICAL DATA:  Patient with shortness of breath and history of CHF. EXAM: CHEST  2 VIEW COMPARISON:  Chest radiograph 05/27/2016 FINDINGS: Interval worsening left mid and lower lung pulmonary consolidation. Probable small left pleural effusion. Stable enlarged cardiac and mediastinal contours. No pneumothorax. Thoracic spine degenerative changes. IMPRESSION: Interval worsening left mid lower lung pulmonary consolidation concerning for pneumonia. Followup PA and lateral chest X-ray is recommended in 3-4 weeks following trial of antibiotic therapy to ensure resolution and exclude underlying malignancy. Electronically Signed   By: Lovey Newcomer M.D.   On: 05/29/2016  09:29   I have personally reviewed and evaluated these images and lab results as part of my medical decision-making.   EKG Interpretation   Date/Time:  Thursday May 29 2016 08:32:41 EDT Ventricular Rate:  68 PR Interval:  177 QRS Duration: 80 QT Interval:  425 QTC Calculation: 452 R Axis:   88 Text Interpretation:  Sinus rhythm Ventricular premature complex Right  atrial enlargement LVH with secondary repolarization abnormality No acute  changes No significant change since last tracing Confirmed by Kathrynn Humble,  MD, Thelma Comp [phone]) on 05/29/2016 9:11:44 AM      MDM   Final diagnoses:  CAP (community acquired pneumonia)  Respiratory distress  Acute hypernatremia   Pt comes in with cc of dyspnea. He was treated for CAP yday. He has hx of moderate-severe AS and a large murmur. Pt doesn't appear to be fluid overloaded, but the ddx will include AS/ CHF, worsening PNA.  12:16 PM CBC is normal. Xray shows worsening infiltrate. CAP tx started. Severe sepsis, given lactate > 2. Medicine to admit. Clinical concerns are higher for cardiac etiology -given BNP is > 4500. Given he has severe AS, we will defer the diuresing to  admitting team.      Varney Biles, MD 05/29/16 1217

## 2016-05-29 NOTE — ED Notes (Signed)
Per Dr. Kathrynn Humble pt does not need fluid bolus.  This RN received verbal order to start NS at maintenance rate of 160mL/hour once antibiotics are done.

## 2016-05-29 NOTE — ED Notes (Signed)
Dr. Kathrynn Humble made aware Lactic 2.26.

## 2016-05-30 ENCOUNTER — Inpatient Hospital Stay (HOSPITAL_COMMUNITY): Payer: Medicare Other

## 2016-05-30 DIAGNOSIS — N189 Chronic kidney disease, unspecified: Secondary | ICD-10-CM

## 2016-05-30 DIAGNOSIS — I350 Nonrheumatic aortic (valve) stenosis: Secondary | ICD-10-CM

## 2016-05-30 DIAGNOSIS — I10 Essential (primary) hypertension: Secondary | ICD-10-CM

## 2016-05-30 LAB — URINALYSIS, ROUTINE W REFLEX MICROSCOPIC
BILIRUBIN URINE: NEGATIVE
Glucose, UA: NEGATIVE mg/dL
Hgb urine dipstick: NEGATIVE
KETONES UR: NEGATIVE mg/dL
NITRITE: NEGATIVE
PH: 5 (ref 5.0–8.0)
Protein, ur: NEGATIVE mg/dL
SPECIFIC GRAVITY, URINE: 1.021 (ref 1.005–1.030)

## 2016-05-30 LAB — CBC
HEMATOCRIT: 32 % — AB (ref 39.0–52.0)
HEMOGLOBIN: 9.6 g/dL — AB (ref 13.0–17.0)
MCH: 26.2 pg (ref 26.0–34.0)
MCHC: 30 g/dL (ref 30.0–36.0)
MCV: 87.2 fL (ref 78.0–100.0)
Platelets: 128 10*3/uL — ABNORMAL LOW (ref 150–400)
RBC: 3.67 MIL/uL — ABNORMAL LOW (ref 4.22–5.81)
RDW: 13.3 % (ref 11.5–15.5)
WBC: 6.7 10*3/uL (ref 4.0–10.5)

## 2016-05-30 LAB — URINE MICROSCOPIC-ADD ON

## 2016-05-30 LAB — BASIC METABOLIC PANEL
ANION GAP: 8 (ref 5–15)
BUN: 36 mg/dL — ABNORMAL HIGH (ref 6–20)
CALCIUM: 10.7 mg/dL — AB (ref 8.9–10.3)
CO2: 30 mmol/L (ref 22–32)
Chloride: 115 mmol/L — ABNORMAL HIGH (ref 101–111)
Creatinine, Ser: 0.79 mg/dL (ref 0.61–1.24)
Glucose, Bld: 96 mg/dL (ref 65–99)
POTASSIUM: 3 mmol/L — AB (ref 3.5–5.1)
SODIUM: 153 mmol/L — AB (ref 135–145)

## 2016-05-30 LAB — BRAIN NATRIURETIC PEPTIDE

## 2016-05-30 MED ORDER — IPRATROPIUM-ALBUTEROL 0.5-2.5 (3) MG/3ML IN SOLN
3.0000 mL | Freq: Four times a day (QID) | RESPIRATORY_TRACT | Status: DC | PRN
Start: 2016-05-30 — End: 2016-05-31

## 2016-05-30 MED ORDER — FUROSEMIDE 10 MG/ML IJ SOLN: 40.0000 mg | Freq: Every day | INTRAMUSCULAR | Status: DC

## 2016-05-30 MED ORDER — POTASSIUM CHLORIDE CRYS ER 20 MEQ PO TBCR: 40.0000 meq | EXTENDED_RELEASE_TABLET | Freq: Once | ORAL | Status: DC

## 2016-05-30 MED ORDER — ENOXAPARIN SODIUM 30 MG/0.3ML ~~LOC~~ SOLN
30.0000 mg | SUBCUTANEOUS | Status: DC
  Filled 2016-05-30: qty 0.3

## 2016-05-30 MED ORDER — POTASSIUM CHLORIDE 20 MEQ/15ML (10%) PO SOLN
40.0000 meq | Freq: Once | ORAL | Status: AC
  Administered 2016-05-30: 40 meq via ORAL
  Filled 2016-05-30: qty 30

## 2016-05-30 MED ORDER — FUROSEMIDE 10 MG/ML IJ SOLN
20.0000 mg | Freq: Once | INTRAMUSCULAR | Status: AC
  Administered 2016-05-30: 20 mg via INTRAVENOUS
  Filled 2016-05-30: qty 2

## 2016-05-30 NOTE — Evaluation (Signed)
Clinical/Bedside Swallow Evaluation Patient Details  Name: Bradley [name] MRN: [id number] Date of Birth: 1941-04-02  Today's Date: 05/30/2016 Time: SLP Start Time (ACUTE ONLY): 0910 SLP Stop Time (ACUTE ONLY): 0926 SLP Time Calculation (min) (ACUTE ONLY): 16 min  Past Medical History:  Past Medical History  Diagnosis Date  . Stroke ([place])   . Hypertension   . Hyperlipidemia   . Aortic stenosis   . CHF (congestive heart failure) ([place])   . MGUS (monoclonal gammopathy of unknown significance)   . Goiter   . Rhabdomyolysis   . UTI (urinary tract infection) 05/09/2014  . Dementia    Past Surgical History:  Past Surgical History  Procedure Laterality Date  . Circumcision     HPI:  Bradley [name] is a 75 y.o. male with medical history significant of prior CVA, HTN, stage III CKD and recent dx of PNA as an outpatient on 05/27/16 who is being admitted for shortness of breath. Most of the history is taken from the patient's daughter via phone as the patient has significant slurring of his speech chronically. Apparently the patient was in his usual state of health until 2 days ago when he had cough, shortness of breath. Was evaluated at [facility] on 5/30 and found to have PNA, for which he was started on Doxycycline PO. He has been taking the medication but today had sudden onset of shortness of breath. In the ER, chest XR demonstrated worsening left sided PNA. Pt has a history of dysphagia and has been recommended to consume nectar thick liqudis with a second swallow in the past due to sensed aspiration before the swallow with thin liquids.    Assessment / Plan / Recommendation Clinical Impression  Pt has a history of chronic dysphagia, daughter manages his diet and precautions very closely. She reports he was downgraded to honey since last admit and she has kept him on that since then without difficulty. He has tlelrated well, but since he has been feeling sick she has observed some increased oral holding.  SLP observed pt consume puree, dys 2 and honey thick liquids via spoon with prolonged oral transit, delayed swallow and delayed cough, particularly with runny oatmeal. Pts daughter verbalized awareness of visual cues to let her know pt has swallowed and provided very good verbal cues for pt to increase force of cough as needed. Reinforced strategies and suggested trying a a sippy cup to allow pt to have more liberal intake during the day still with limited bolus size. Pt to consume Dys 1 (puree) diet with honey thick liquids. No f/u needed as pt is at baseline and education complete.     Aspiration Risk  Moderate aspiration risk    Diet Recommendation Dysphagia 1 (Puree);Honey-thick liquid   Liquid Administration via: Spoon Medication Administration: Crushed with puree Supervision: Staff to assist with self feeding;Full supervision/cueing for compensatory strategies Compensations: Slow rate;Small sips/bites Postural Changes: Seated upright at 90 degrees    Other  Recommendations Oral Care Recommendations: Oral care BID Other Recommendations: Order thickener from pharmacy   Follow up Recommendations  24 hour supervision/assistance    Frequency and Duration            Prognosis        Swallow Study   General HPI: Bradley [name] is a 75 y.o. male with medical history significant of prior CVA, HTN, stage III CKD and recent dx of PNA as an outpatient on 05/27/16 who is being admitted for shortness of breath. Most  of the history is taken from the patient's daughter via phone as the patient has significant slurring of his speech chronically. Apparently the patient was in his usual state of health until 2 days ago when he had cough, shortness of breath. Was evaluated at [facility] on 5/30 and found to have PNA, for which he was started on Doxycycline PO. He has been taking the medication but today had sudden onset of shortness of breath. In the ER, chest XR demonstrated worsening left sided PNA. Pt has a  history of dysphagia and has been recommended to consume nectar thick liqudis with a second swallow in the past due to sensed aspiration before the swallow with thin liquids.  Type of Study: Bedside Swallow Evaluation Previous Swallow Assessment: see HPI Diet Prior to this Study: Dysphagia 2 (chopped);Honey-thick liquids Temperature Spikes Noted: No Respiratory Status: Room air History of Recent Intubation: No Behavior/Cognition: Alert;Cooperative;Requires cueing Oral Cavity Assessment: Within Functional Limits Oral Care Completed by SLP: Recent completion by staff Oral Cavity - Dentition: Adequate natural dentition Vision: Impaired for self-feeding Self-Feeding Abilities: Total assist Patient Positioning: Upright in bed Baseline Vocal Quality: Low vocal intensity Volitional Cough: Strong Volitional Swallow: Unable to elicit    Oral/Motor/Sensory Function Overall Oral Motor/Sensory Function: Within functional limits   Ice Chips     Thin Liquid Thin Liquid: Not tested    Nectar Thick Nectar Thick Liquid: Not tested   Honey Thick Honey Thick Liquid: Impaired Presentation: Spoon Pharyngeal Phase Impairments: Suspected delayed Swallow   Puree Puree: Impaired Presentation: Spoon Oral Phase Functional Implications: Prolonged oral transit Pharyngeal Phase Impairments: Cough - Delayed;Throat Clearing - Delayed;Wet Vocal Quality;Suspected delayed Swallow   Solid   GO   Solid: Impaired Presentation: Spoon Oral Phase Functional Implications: Prolonged oral transit Pharyngeal Phase Impairments: Suspected delayed Swallow       Herbie Baltimore, MA CCC-SLP [phone]  Ceclia Koker, Katherene Ponto 05/30/2016,10:10 AM

## 2016-05-30 NOTE — Clinical Documentation Improvement (Signed)
Internal Medicine  Can the diagnosis of Heart Failure be further specified by acuity?  Thank you    Acuity - Acute, Chronic, Acute on Chronic  Other  Clinically Undetermined   Document any associated diagnoses/conditions   Supporting Information: Acute dypsnea, BNP > 4500   Please exercise your independent, professional judgment when responding. A specific answer is not anticipated or expected.   Thank You,  [place] [phone]

## 2016-05-30 NOTE — Progress Notes (Signed)
[facility]                                                                              Patient Demographics  Bradley [name], is a 75 y.o. male, DOB - 1941/08/16, [id number]  Admit date - 05/29/2016   Admitting Physician Mir Marry Guan, MD  Outpatient Primary MD for the patient is [name], MD  Outpatient specialists:   LOS - 1  days    Chief Complaint  Patient presents with  . Shortness of Breath       Brief summary   Patient is 75 year old male with prior CVA, hypertension, stage III CKD, recent diagnosis of pneumonia as outpatient, seen in ED on 5/30 presented with shortness of breath. Apparently patient was in his usual state of health until 2 days prior to admission, was evaluated in ED on 5/30, found to have PNA, started on doxycycline. However on the morning of admission, had sudden onset of shortness of breath with productive cough. In ED chest x-ray showed worsening left-sided pneumonia and hospitalist service was called for admission. BNP over 4500, lactic acid 2.26, creatinine 1.0, sodium 151   Assessment & Plan    Acute dyspnea - likely combination of PNA as well as fluid overload from known diastolic heart failure and aortic stenosis, given elevated BNP. - Patient was given IV fluids due to elevated lactic acid in ED however BNP >4500 - Continue IV Zithromax, Rocephin, lactic acid has normalized - Follow blood cultures   CAP (community acquired pneumonia) - with cough, sob, and worsening CXR -Continue with IV Azithromycin and rocephin - Chest x-ray showed cardiomegaly with left mid and lower lung infiltrates consistent with pneumonia, small left pleural effusion. - Swallow evaluation, SLP given history of dysphagia and prior stroke    Hypertension - cont home meds  CHF with known history of severe aortic stenosis - Patient is not a candidate for TAVR, cardiology consulted - Normally on Lasix 40 mg oral daily, per  cardiology place on IV diuresis - Follow 2-D echo, prior echo in 12 2015 showed EF of 55-60% with severe aortic stenosis   Chronic renal insufficiency, stage III (moderate) - follow daily creatinine  Hypernatremia - Currently on IV diuresis, difficult situation as patient also has volume overload   Hypokalemia - replaced   Dementia - PTOT evaluation prior to discharge, lives alone with private caregivers per the daughter at the bedside  Code Status:   DVT Prophylaxis:  Lovenox    Family Communication: Discussed in detail with the patient, all imaging results, lab results explained to the patient and daughter at the bedside    Disposition Plan: Patient lives at home alone with private caregivers per the daughter at the bedside  time Spent in minutes 25 minutes  Procedures:  None   Consultants:   Cardiology  Antimicrobials :   IV Zithromax 6/1    IV Rocephin 6/1    Medications  Scheduled Meds: . amLODipine  10 mg Oral Daily  . aspirin  81 mg Oral Daily  . atorvastatin  10 mg Oral Daily  . azithromycin  500 mg Intravenous Q24H  .  benzonatate  100 mg Oral Q8H  . cefTRIAXone (ROCEPHIN)  IV  1 g Intravenous Q24H  . docusate sodium  100 mg Oral Q12H  . enoxaparin (LOVENOX) injection  40 mg Subcutaneous Q24H  . hydrALAZINE  100 mg Oral Daily  . hydrALAZINE  50 mg Oral QHS  . ipratropium-albuterol  3 mL Nebulization BID  . labetalol  300 mg Oral BID   Continuous Infusions:  PRN Meds:.acetaminophen **OR** acetaminophen, albuterol, ALPRAZolam, ondansetron **OR** ondansetron (ZOFRAN) IV, pramoxine, RESOURCE THICKENUP CLEAR   Antibiotics   Anti-infectives    Start     Dose/Rate Route Frequency Ordered Stop   05/30/16 1100  azithromycin (ZITHROMAX) 500 mg in dextrose 5 % 250 mL IVPB     500 mg 250 mL/hr over 60 Minutes Intravenous Every 24 hours 05/29/16 1019     05/30/16 1100  cefTRIAXone (ROCEPHIN) 1 g in dextrose 5 % 50 mL IVPB     1 g 100 mL/hr over 30  Minutes Intravenous Every 24 hours 05/29/16 1019     05/29/16 1030  azithromycin (ZITHROMAX) 500 mg in dextrose 5 % 250 mL IVPB     500 mg 250 mL/hr over 60 Minutes Intravenous  Once 05/29/16 1016 05/29/16 1136   05/29/16 1000  cefTRIAXone (ROCEPHIN) 1 g in dextrose 5 % 50 mL IVPB     1 g 100 mL/hr over 30 Minutes Intravenous  Once 05/29/16 0957 05/29/16 1106        Subjective:   Bradley [name] was seen and examined today. Difficult to obtain review of systems from the patient, no significant shortness of breath. Daughter at the bedside. No acute events overnight.No chest pain, fevers or chills    Objective:   Filed Vitals:   05/29/16 2024 05/29/16 2210 05/30/16 0613 05/30/16 0735  BP:  122/73 132/69   Pulse:   71   Temp:   98.3 F (36.8 C)   TempSrc:   Oral   Resp:   16   Height:      Weight:      SpO2: 99%  98% 98%    Intake/Output Summary (Last 24 hours) at 05/30/16 1347 Last data filed at 05/30/16 0900  Gross per 24 hour  Intake      0 ml  Output    701 ml  Net   -701 ml     Wt Readings from Last 3 Encounters:  05/29/16 39.145 kg (86 lb 4.8 oz)  04/18/16 53.524 kg (118 lb)  02/26/16 54.432 kg (120 lb)     Exam  General: Alert and oriented, NAD, Speaks very softly/in whispers  HEENT:    Neck: Supple, + JVD, no masses  Cardiovascular: S1 S2 auscultated, 3/6 systolic murmur .  Respiratory: Clear to auscultation bilaterally, no wheezing, rales or rhonchi  Gastrointestinal: Decreased breath sounds at the bases   Ext: no cyanosis clubbing or edema  Neuro: AAOx3, Cr N's II- XII. Strength 5/5 upper and lower extremities bilaterally  Skin: No rashes  Psych: Normal affect and demeanor, alert and oriented x3    Data Reviewed:  I have personally reviewed following labs and imaging studies  Micro Results No results found for this or any previous visit (from the past 240 hour(s)).  Radiology Reports X-ray Chest Pa And Lateral  05/30/2016  CLINICAL DATA:   Pneumonia. EXAM: CHEST  2 VIEW COMPARISON:  05/29/2016 . FINDINGS: Mediastinum and hilar structures are normal. Cardiomegaly. Left mid and lower lung infiltrates consistent pneumonia is again noted. Small  left pleural effusion. No pneumothorax. IMPRESSION: 1. Cardiomegaly. 2. Left mid and lower lung lobe infiltrates consistent with pneumonia. No interim change from prior exam. Small left pleural effusion. Electronically Signed   By: Marcello Moores  Register   On: 05/30/2016 13:08   Dg Chest 2 View  05/29/2016  CLINICAL DATA:  Patient with shortness of breath and history of CHF. EXAM: CHEST  2 VIEW COMPARISON:  Chest radiograph 05/27/2016 FINDINGS: Interval worsening left mid and lower lung pulmonary consolidation. Probable small left pleural effusion. Stable enlarged cardiac and mediastinal contours. No pneumothorax. Thoracic spine degenerative changes. IMPRESSION: Interval worsening left mid lower lung pulmonary consolidation concerning for pneumonia. Followup PA and lateral chest X-ray is recommended in 3-4 weeks following trial of antibiotic therapy to ensure resolution and exclude underlying malignancy. Electronically Signed   By: Lovey Newcomer M.D.   On: 05/29/2016 09:29   Dg Chest 2 View  05/27/2016  CLINICAL DATA:  Worsening cough EXAM: CHEST  2 VIEW COMPARISON:  04/18/2016 FINDINGS: Cardiac shadow is stable. The lungs are well aerated bilaterally. New left mid and lower lung infiltrate is noted. No bony abnormality is noted. IMPRESSION: New left mid and lower lung infiltrate. Electronically Signed   By: Inez Catalina M.D.   On: 05/27/2016 07:54    Lab Data:  CBC:  Recent Labs Lab 05/29/16 1007 05/30/16 0556  WBC 6.6 6.7  NEUTROABS 5.6  --   HGB 10.7* 9.6*  HCT 34.9* 32.0*  MCV 86.6 87.2  PLT 145* 0000000*   Basic Metabolic Panel:  Recent Labs Lab 05/29/16 1007 05/30/16 0556  NA 151* 153*  K 3.2* 3.0*  CL 111 115*  CO2 31 30  GLUCOSE 129* 96  BUN 43* 36*  CREATININE 1.02 0.79  CALCIUM  11.0* 10.7*   GFR: Estimated Creatinine Clearance: 44.8 mL/min (by C-G formula based on Cr of 0.79). Liver Function Tests: No results for input(s): AST, ALT, ALKPHOS, BILITOT, PROT, ALBUMIN in the last 168 hours. No results for input(s): LIPASE, AMYLASE in the last 168 hours. No results for input(s): AMMONIA in the last 168 hours. Coagulation Profile: No results for input(s): INR, PROTIME in the last 168 hours. Cardiac Enzymes: No results for input(s): CKTOTAL, CKMB, CKMBINDEX, TROPONINI in the last 168 hours. BNP (last 3 results) No results for input(s): PROBNP in the last 8760 hours. HbA1C: No results for input(s): HGBA1C in the last 72 hours. CBG: No results for input(s): GLUCAP in the last 168 hours. Lipid Profile: No results for input(s): CHOL, HDL, LDLCALC, TRIG, CHOLHDL, LDLDIRECT in the last 72 hours. Thyroid Function Tests: No results for input(s): TSH, T4TOTAL, FREET4, T3FREE, THYROIDAB in the last 72 hours. Anemia Panel: No results for input(s): VITAMINB12, FOLATE, FERRITIN, TIBC, IRON, RETICCTPCT in the last 72 hours. Urine analysis:    Component Value Date/Time   COLORURINE YELLOW 04/18/2016 1911   APPEARANCEUR CLOUDY* 04/18/2016 1911   LABSPEC 1.017 04/18/2016 1911   PHURINE 5.0 04/18/2016 1911   GLUCOSEU NEGATIVE 04/18/2016 1911   HGBUR NEGATIVE 04/18/2016 1911   BILIRUBINUR NEGATIVE 04/18/2016 1911   KETONESUR NEGATIVE 04/18/2016 1911   PROTEINUR 30* 04/18/2016 1911   UROBILINOGEN 0.2 11/07/2015 1221   NITRITE POSITIVE* 04/18/2016 1911   LEUKOCYTESUR LARGE* 04/18/2016 1911     Trevar Boehringer M.D. [facility] 05/30/2016, 1:47 PM  Pager: [phone] Between 7am to 7pm - call Pager - [phone]  After 7pm go to [email or web address] - password [email or web address]  Call night coverage person covering after 7pm

## 2016-05-30 NOTE — Care Management Note (Signed)
Case Management Note  Patient Details  Name: Bradley [name] MRN: [id number] Date of Birth: 07-18-1941  Subjective/Objective:                 Spoke with patient's daughter at the bedside. She states patient has PCS M-F 2-4:40, Sat 10-1:30, and Sun 10-2:00. Patient is supervised 24/7 by family members.  Patient has walker WC shower seat and nebulizer. They would prefer [facility] but not nursing care. Patient would like [facility] in [place] Phone: [phone], Fax: [phone]. CM LM with office for referral.    Action/Plan:  HH orders will need faxed at DC.   Expected Discharge Date:                  Expected Discharge Plan:  [name]  In-House Referral:     Discharge planning Services  CM Consult  Post Acute Care Choice:  Home Health Choice offered to:  Adult Children, Patient  DME Arranged:    DME Agency:     HH Arranged:    HH Agency:     Status of Service:  In process, will continue to follow  Medicare Important Message Given:    Date Medicare IM Given:    Medicare IM give by:    Date Additional Medicare IM Given:    Additional Medicare Important Message give by:     If discussed at [place] of Stay Meetings, dates discussed:    Additional Comments:  Carles Collet, RN 05/30/2016, 2:19 PM

## 2016-05-30 NOTE — Progress Notes (Signed)
Faxed Facesheet and H&P to [facility]

## 2016-05-30 NOTE — Progress Notes (Signed)
Initial Nutrition Assessment  DOCUMENTATION CODES:   Underweight, Severe malnutrition in context of chronic illness  INTERVENTION:   -Magic Cup TID with meals -Snacks TID ([employer] pudding)  NUTRITION DIAGNOSIS:   Malnutrition related to chronic illness as evidenced by severe depletion of body fat, moderate depletions of muscle mass, percent weight loss.  GOAL:   Patient will meet greater than or equal to 90% of their needs  MONITOR:   PO intake, Supplement acceptance, Labs, Weight trends, Skin, I & O's  REASON FOR ASSESSMENT:   Consult Assessment of nutrition requirement/status  ASSESSMENT:   Bradley EISENSTADT is a 75 y.o. male with medical history significant of prior CVA, HTN, stage III CKD and recent dx of PNA as an outpatient on 05/27/16 who is being admitted for shortness of breath.  Pt admitted with acute dyspnea.   Hx obtained mainly from pt daughter at bedside. She reports pt pt's appetite is fair and denies any changes in diet or intake. Pt has 24 hour supervision and is provided 3 meals per day. Pt daughter reports that pt has been on honey thickened liquids "for a long time". Reviewed SLP note; noted recommendation to downgrade to dysphagia 1 diet. Meal completion 5-50%.  Pt daughter reports wt loss, however, unable to provide quantity or time frame for weight loss. Noted pt has experienced a 27% wt loss over the past 6 months, which is significant for time frame. Pt daughter reports frustration over weight loss, as she reports she is trying to promote weight gain within restrictions of diet, but also reports that cardiologist does not want to pt to gain a lot of weight in order to manage fluid status. Pt does receive Magic Cup at home.   Nutrition-Focused physical exam completed. Findings are severe fat depletion, severe muscle depletion, and no edema.   Discussed with daughter other ways to include additional protein sources in diet [employer], pudding, pureed  meats). Pt and daughter deny any further nutritional needs or questions at this time.   Case discussed with RN. She confirms diet downgrade and pt tolerating well. Reports pt is being fed by spoon.   Labs reviewed: Na: 153, K: 3.0.   Diet Order:  DIET - DYS 1 Room service appropriate?: Yes; Fluid consistency:: Honey Thick  Skin:  Reviewed, no issues  Last BM:  05/29/16  Height:   Ht Readings from Last 1 Encounters:  05/29/16 5\' 5"  (1.651 m)    Weight:   Wt Readings from Last 1 Encounters:  05/29/16 86 lb 4.8 oz (39.145 kg)    Ideal Body Weight:  61.8 kg  BMI:  Body mass index is 14.36 kg/(m^2).  Estimated Nutritional Needs:   Kcal:  1100-1300  Protein:  50-60 grams  Fluid:  1.1-1.3 L  EDUCATION NEEDS:   Education needs addressed  Brayten Komar A. Jimmye Norman, RD, LDN, CDE Pager: [phone] After hours Pager: [phone]

## 2016-05-30 NOTE — Consult Note (Signed)
Reason for Consult: HF and severe AS with CAP now   Referring Physician: Dr. Tana Coast   PCP:  [name], MD  Primary Cardiologist:Dr. Einar Grad Bradley [name] is an 75 y.o. male.    Chief Complaint: Admitted  05/29/16 with SOB.   HPI: Asked to see 75 y.o. male with a history of severe AS and prior CVA, some dementia. He was last admitted in April 2016 with UTI and confusion. He was seen by Dr Burt Knack for possible TAVR but was not felt to be a candidate.  Per Dr. Burt Knack "I think in the setting of his poor functional capacity, the risk/benefit of pursuing further treatment for his aortic stenosis is unfavorable. There is a high likelihood that his functional capacity would not be significantly improved. The patient's family understands that without treatment, he will likely develop progressive congestive heart failure and that is overall prognosis is poor. The patient and his family strongly favor a conservative approach with palliative medical therapy."  Also hx with aspiration 10/2015 with SLP eval.  Dysphagia 1 diet.   He was seen in ER 05/27/16 and Dx with PNA and placed on doxy.  Returned to ER yesterday with SOB. Lactic 2.26.  BNP >4500.  CXR with increased mid and lower lung consolidation.  EKG with SR with 1st degree block 319m.  LVH no significant changes.   Currently once he woke up he had no complaints, resting comfortably.  His dementia is stable per the daughter.    Past Medical History  Diagnosis Date  . Stroke ([place]   . Hypertension   . Hyperlipidemia   . Aortic stenosis   . [place]ive heart [place]) [place]   . MGUS (monoclonal gammopathy of unknown significance)   . Goiter   . Rhabdomyolysis   . UTI (urinary tract infection) 05/09/2014  . Dementia     Past Surgical History  Procedure Laterality Date  . Circumcision      Family History  Problem Relation Age of Onset  . Family history unknown: Yes   Social History:  reports that he has never  smoked. He has never used smokeless tobacco. He reports that he does not drink alcohol or use illicit drugs.  Allergies: No Known Allergies  Echo:  11/2014:  - Left ventricle: The cavity size was normal. Wall thickness was increased in a pattern of moderate LVH. Systolic function was normal. The estimated ejection fraction was in the range of 55% to 60%. - Aortic valve: There was severe stenosis. There was mild regurgitation.  with a mean gradient of 58 mmHg and preserved LV systolic function. - Left atrium: The atrium was mildly dilated. - Atrial septum: No subcostal images cannot comment on ASD/PFO.  OUTPATIENT MEDICATIONS: No current facility-administered medications on file prior to encounter.   Current Outpatient Prescriptions on File Prior to Encounter  Medication Sig Dispense Refill  . albuterol (PROVENTIL) (2.5 MG/3ML) 0.083% nebulizer solution Take 3 mLs (2.5 mg total) by nebulization every 4 (four) hours as needed for wheezing or shortness of breath. 75 mL 0  . ALPRAZolam (XANAX) 0.25 MG tablet Take 0.5-1 tablets by mouth daily as needed for anxiety.   0  . amLODipine (NORVASC) 10 MG tablet Take 10 mg by mouth daily.    .Marland Kitchenaspirin 81 MG tablet Take 81 mg by mouth daily.    .Marland Kitchenatorvastatin (LIPITOR) 10 MG tablet Take 10 mg by mouth daily.    . benzonatate (TESSALON)  100 MG capsule Take 1 capsule (100 mg total) by mouth every 8 (eight) hours. 21 capsule 0  . cetirizine (ZYRTEC ALLERGY) 10 MG tablet Take 1 tablet (10 mg total) by mouth daily. 30 tablet 0  . docusate sodium (COLACE) 100 MG capsule Take 1 capsule (100 mg total) by mouth every 12 (twelve) hours. 14 capsule 0  . doxycycline (VIBRAMYCIN) 100 MG capsule Take 1 capsule (100 mg total) by mouth 2 (two) times daily. 14 capsule 0  . furosemide (LASIX) 40 MG tablet Take 40 mg by mouth daily.  0  . hydrALAZINE (APRESOLINE) 100 MG tablet Take 50-100 mg by mouth 2 (two) times daily. Take 100 mg every morning and 50 mg  every night.  5  . ipratropium-albuterol (DUONEB) 0.5-2.5 (3) MG/3ML SOLN Take 3 mLs by nebulization 2 (two) times daily. 360 mL   . labetalol (NORMODYNE) 300 MG tablet Take 1 tablet (300 mg total) by mouth 2 (two) times daily.    . Maltodextrin-Xanthan Gum (RESOURCE THICKENUP CLEAR) POWD With every meal    . pramoxine (PROCTOFOAM) 1 % foam Place 1 application rectally 3 (three) times daily as needed for itching. 15 g 0  . amoxicillin-clavulanate (AUGMENTIN) 875-125 MG tablet Take 1 tablet by mouth 2 (two) times daily. 10 tablet 0  . ciprofloxacin (CIPRO) 500 MG tablet Take 1 tablet (500 mg total) by mouth every 12 (twelve) hours. 20 tablet 0   CURRENT MEDICATIONS: Scheduled Meds: . amLODipine  10 mg Oral Daily  . aspirin  81 mg Oral Daily  . atorvastatin  10 mg Oral Daily  . azithromycin  500 mg Intravenous Q24H  . benzonatate  100 mg Oral Q8H  . cefTRIAXone (ROCEPHIN)  IV  1 g Intravenous Q24H  . docusate sodium  100 mg Oral Q12H  . enoxaparin (LOVENOX) injection  40 mg Subcutaneous Q24H  . hydrALAZINE  100 mg Oral Daily  . hydrALAZINE  50 mg Oral QHS  . ipratropium-albuterol  3 mL Nebulization BID  . labetalol  300 mg Oral BID   Continuous Infusions:  PRN Meds:.acetaminophen **OR** acetaminophen, albuterol, ALPRAZolam, ondansetron **OR** ondansetron (ZOFRAN) IV, pramoxine, RESOURCE THICKENUP CLEAR   Results for orders placed or performed during the hospital encounter of 05/29/16 (from the past 48 hour(s))  CBC with Differential/Platelet     Status: Abnormal   Collection Time: 05/29/16 10:07 AM  Result Value Ref Range   WBC 6.6 4.0 - 10.5 K/uL   RBC 4.03 (L) 4.22 - 5.81 MIL/uL   Hemoglobin 10.7 (L) 13.0 - 17.0 g/dL   HCT 34.9 (L) 39.0 - 52.0 %   MCV 86.6 78.0 - 100.0 fL   MCH 26.6 26.0 - 34.0 pg   MCHC 30.7 30.0 - 36.0 g/dL   RDW 13.3 11.5 - 15.5 %   Platelets 145 (L) 150 - 400 K/uL   Neutrophils Relative % 85 %   Neutro Abs 5.6 1.7 - 7.7 K/uL   Lymphocytes Relative 7 %    Lymphs Abs 0.5 (L) 0.7 - 4.0 K/uL   Monocytes Relative 8 %   Monocytes Absolute 0.5 0.1 - 1.0 K/uL   Eosinophils Relative 0 %   Eosinophils Absolute 0.0 0.0 - 0.7 K/uL   Basophils Relative 0 %   Basophils Absolute 0.0 0.0 - 0.1 K/uL  Basic metabolic panel     Status: Abnormal   Collection Time: 05/29/16 10:07 AM  Result Value Ref Range   Sodium 151 (H) 135 - 145 mmol/L   Potassium  3.2 (L) 3.5 - 5.1 mmol/L   Chloride 111 101 - 111 mmol/L   CO2 31 22 - 32 mmol/L   Glucose, Bld 129 (H) 65 - 99 mg/dL   BUN 43 (H) 6 - 20 mg/dL   Creatinine, Ser 1.02 0.61 - 1.24 mg/dL   Calcium 11.0 (H) 8.9 - 10.3 mg/dL   GFR calc non Af Amer >60 >60 mL/min   GFR calc Af Amer >60 >60 mL/min    Comment: (NOTE) The eGFR has been calculated using the CKD EPI equation. This calculation has not been validated in all clinical situations. eGFR's persistently <60 mL/min signify possible Chronic Kidney Disease.    Anion gap 9 5 - 15  Brain natriuretic peptide     Status: Abnormal   Collection Time: 05/29/16 10:07 AM  Result Value Ref Range   B Natriuretic Peptide >4500.0 (H) 0.0 - 100.0 pg/mL  I-Stat CG4 Lactic Acid, ED     Status: Abnormal   Collection Time: 05/29/16 10:10 AM  Result Value Ref Range   Lactic Acid, Venous 2.26 (HH) 0.5 - 2.0 mmol/L   Comment NOTIFIED PHYSICIAN   Lactic acid, plasma     Status: None   Collection Time: 05/29/16  2:58 PM  Result Value Ref Range   Lactic Acid, Venous 1.2 0.5 - 2.0 mmol/L  Brain natriuretic peptide     Status: Abnormal   Collection Time: 05/30/16  5:56 AM  Result Value Ref Range   B Natriuretic Peptide >4500.0 (H) 0.0 - 100.0 pg/mL  Basic metabolic panel     Status: Abnormal   Collection Time: 05/30/16  5:56 AM  Result Value Ref Range   Sodium 153 (H) 135 - 145 mmol/L   Potassium 3.0 (L) 3.5 - 5.1 mmol/L   Chloride 115 (H) 101 - 111 mmol/L   CO2 30 22 - 32 mmol/L   Glucose, Bld 96 65 - 99 mg/dL   BUN 36 (H) 6 - 20 mg/dL   Creatinine, Ser 0.79 0.61 -  1.24 mg/dL   Calcium 10.7 (H) 8.9 - 10.3 mg/dL   GFR calc non Af Amer >60 >60 mL/min   GFR calc Af Amer >60 >60 mL/min    Comment: (NOTE) The eGFR has been calculated using the CKD EPI equation. This calculation has not been validated in all clinical situations. eGFR's persistently <60 mL/min signify possible Chronic Kidney Disease.    Anion gap 8 5 - 15  CBC     Status: Abnormal   Collection Time: 05/30/16  5:56 AM  Result Value Ref Range   WBC 6.7 4.0 - 10.5 K/uL   RBC 3.67 (L) 4.22 - 5.81 MIL/uL   Hemoglobin 9.6 (L) 13.0 - 17.0 g/dL   HCT 32.0 (L) 39.0 - 52.0 %   MCV 87.2 78.0 - 100.0 fL   MCH 26.2 26.0 - 34.0 pg   MCHC 30.0 30.0 - 36.0 g/dL   RDW 13.3 11.5 - 15.5 %   Platelets 128 (L) 150 - 400 K/uL   Dg Chest 2 View  05/29/2016  CLINICAL DATA:  Patient with shortness of breath and history of CHF. EXAM: CHEST  2 VIEW COMPARISON:  Chest radiograph 05/27/2016 FINDINGS: Interval worsening left mid and lower lung pulmonary consolidation. Probable small left pleural effusion. Stable enlarged cardiac and mediastinal contours. No pneumothorax. Thoracic spine degenerative changes. IMPRESSION: Interval worsening left mid lower lung pulmonary consolidation concerning for pneumonia. Followup PA and lateral chest X-ray is recommended in 3-4 weeks following trial of  antibiotic therapy to ensure resolution and exclude underlying malignancy. Electronically Signed   By: Lovey Newcomer M.D.   On: 05/29/2016 09:29    ROS: General:no colds or fevers, no weight changes Skin:no rashes or ulcers HEENT:no blurred vision, no congestion CV:see HPI PUL:see HPI GI:no diarrhea constipation or melena, no indigestion GU:no hematuria, no dysuria MS:no joint pain, no claudication Neuro:no syncope, no lightheadedness Endo:no diabetes, no thyroid disease   Blood pressure 132/69, pulse 71, temperature 98.3 F (36.8 C), temperature source Oral, resp. rate 16, height '5\' 5"'  (1.651 m), weight 86 lb 4.8 oz (39.145  kg), SpO2 98 %.  Wt Readings from Last 3 Encounters:  05/29/16 86 lb 4.8 oz (39.145 kg)  04/18/16 118 lb (53.524 kg)  02/26/16 120 lb (54.432 kg)    PE: General:Pleasant affect, NAD, speaks very softly,  Took awhile for him to wake up.  Skin:Warm and dry, brisk capillary refill HEENT:normocephalic, sclera clear, mucus membranes moist Neck:supple, + JVD, no bruits, Lt neck with soft mass-old, small nodules at base of rt neck.    Heart:S1S2 RRR with 3/6 systolic murmur, no gallup, rub or click Lungs:diminished but he does not take deep breath, few rales, no rhonchi, or wheezes LNL:GXQJ, non tender, + BS, do not palpate liver spleen or masses Ext:no lower ext edema, 1+ pedal pulses, 2+ radial pulses Neuro:alert and oriented to daughter, MAE, follows commands, + facial symmetry    Assessment/Plan Active Problems:   Stroke, small vessel (HCC)   Hypertension   Chronic renal insufficiency, stage III (moderate)   Severe aortic stenosis   Acute dyspnea   CAP (community acquired pneumonia)  CHF with known severe AS and not candidate for TAVR His BNP is significantly elevated-more than previously.  Normally on lasix 40 mg po daily.  Will leave to Dr. Harrington Challenger but maybe 40 mg IV lasix daily.  Follow Cr -stable today 0.79   No dyspnea currently.    HTN controlled.  Severe AS - daughter aware that he will develop progressive SOB but she does not wish to discuss palliative care at this time, he has been fairly stable since discussion with Dr. Burt Knack 03/2015 from cardiac issue.   Dementia   Hx CVA.     Cecilie Kicks  Nurse Practitioner Certified [place] Pager [phone] or after 5pm or weekends call [phone] 05/30/2016, 10:35 AM    Pt seen and examined  Agree with findings as noted by L INgold above  Pt familiar to me from clinic  Unfortunate 75 yo with severe AS  Not felt to be a candidate for TAVR Admitted yesterday with Increased SOB  Failed outpt Rx for  pneumonia Pt currently appears comfortable in bed ON exam:  JVP is increased  Cardiac RRR  Gr III/VI sytolic  Murmur  Lungs:  Decreased BS at L base  Ext without edema Currently in SR   Lbas signif for BNP >4500  Cr 0.79  BNP was elevated 1 year ago   CXR with L sided infiltrates     Could give 1 dose of IV lasix  I would give 20 IV and follow out put  Follow closely  I would not dose on a daily basis  He is comfortable  BNP was elevated 1 year ago    Will continue to follow   Dorris Carnes

## 2016-05-31 DIAGNOSIS — R0600 Dyspnea, unspecified: Secondary | ICD-10-CM

## 2016-05-31 LAB — CBC
HCT: 33.7 % — ABNORMAL LOW (ref 39.0–52.0)
Hemoglobin: 10.1 g/dL — ABNORMAL LOW (ref 13.0–17.0)
MCH: 26.4 pg (ref 26.0–34.0)
MCHC: 30 g/dL (ref 30.0–36.0)
MCV: 88 fL (ref 78.0–100.0)
PLATELETS: 135 10*3/uL — AB (ref 150–400)
RBC: 3.83 MIL/uL — AB (ref 4.22–5.81)
RDW: 13.4 % (ref 11.5–15.5)
WBC: 7.9 10*3/uL (ref 4.0–10.5)

## 2016-05-31 LAB — BASIC METABOLIC PANEL
Anion gap: 9 (ref 5–15)
BUN: 38 mg/dL — ABNORMAL HIGH (ref 6–20)
CALCIUM: 11.1 mg/dL — AB (ref 8.9–10.3)
CO2: 30 mmol/L (ref 22–32)
CREATININE: 0.88 mg/dL (ref 0.61–1.24)
Chloride: 116 mmol/L — ABNORMAL HIGH (ref 101–111)
GFR calc non Af Amer: >60 mL/min (ref >60)
Glucose, Bld: 123 mg/dL — ABNORMAL HIGH (ref 65–99)
Potassium: 3.4 mmol/L — ABNORMAL LOW (ref 3.5–5.1)
SODIUM: 155 mmol/L — AB (ref 135–145)

## 2016-05-31 LAB — URINE CULTURE

## 2016-05-31 LAB — BRAIN NATRIURETIC PEPTIDE: B NATRIURETIC PEPTIDE 5: 3650.2 pg/mL — AB (ref 0.0–100.0)

## 2016-05-31 MED ORDER — LEVOFLOXACIN 500 MG PO TABS: 500.0000 mg | ORAL_TABLET | Freq: Every day | ORAL | Status: DC

## 2016-05-31 MED ORDER — FUROSEMIDE 40 MG PO TABS: 20.0000 mg | ORAL_TABLET | Freq: Every day | ORAL | Status: DC

## 2016-05-31 NOTE — Discharge Summary (Signed)
Physician Discharge Summary  Bradley [name] [id number] DOB: February 04, 1941 DOA: 05/29/2016  PCP: [name], MD  Admit date: 05/29/2016 Discharge date: 05/31/2016  Time spent: > 30 minutes  Recommendations for Outpatient Follow-up:  1. Follow up with Dr. Burt Knack in 1-2 weeks 2. Follow-up with PCP in one week 3. Strongly recommend palliative care approach as an outpatient   Discharge Diagnoses:  Active Problems:   Stroke, small vessel (HCC)   Hypertension   Chronic renal insufficiency, stage III (moderate)   Severe aortic stenosis   Acute dyspnea   CAP (community acquired pneumonia)   Discharge Condition: Stable  Diet recommendation: Heart healthy  Filed Weights   05/29/16 1618  Weight: 39.145 kg (86 lb 4.8 oz)    History of present illness:  Per Dr. Kathyrn Sheriff Bradley [name] is a 75 y.o. male with medical history significant of prior CVA, HTN, stage III CKD and recent dx of PNA as an outpatient on 05/27/16 who is being admitted for shortness of breath. Most of the history is taken from the patient's daughter via phone as the patient has significant slurring of his speech chronically. Apparently the patient was in his usual state of health until 2 days ago when he had cough, shortness of breath. Was evaluated at [facility] on 5/30 and found to have PNA, for which he was started on Doxycycline PO. He has been taking the medication but today had sudden onset of shortness of breath. Patient and daughter deny chest pain, fevers. He does have a productive cough. No pleuritic chest pain. No sick contacts. No recent changes in medications or diet. Patient endorses worsening shortness of breath when he lies back.  ED Course: In the ER, chest XR demonstrated worsening left sided PNA. He was given gentle IVF, as well as abx and hospitalist group was consulted for admission.  Hospital Course:  Acute dyspnea - likely combination of PNA as well as fluid overload from known diastolic heart  failure and aortic stenosis, given elevated BNP. Initially patient was given IV fluids due to elevated lactic acid, however this was discontinued as he was found to have a BNP elevated to greater than 4500. CAP (community acquired pneumonia) - with cough, sob, and worsening CXR - he was initially placed on ceftriaxone and azithromycin with improvement in his respiratory status, his antibiotics were narrowed to Levaquin and he is to complete 5 additional days as an outpatient. His respiratory status improved to baseline. Hypertension - cont home meds Acute on chronic diastolic CHF with known history of severe aortic stenosis - Patient is not a candidate for TAVR, cardiology consulted and followed patient while hospitalized. He was initially IV diuresed with improvement in his fluid status and respiratory status. He is at baseline. Discussed with Dr. Radford Pax from cardiology on day of discharge. He is normally on 40 mg of Lasix daily, dose was decreased to 20 minute grams daily on discharge due to hypernatremia and poor by mouth intake.  Chronic renal insufficiency, stage III (moderate) - follow daily creatinine Hypernatremia - Currently on IV diuresis, difficult situation as patient also has volume overload, he is able to eat  well, encouraged good by mouth intake. This is bound to be a problem that is not easy to resolve given complex medical picture with severe aortic stenosis and predisposition for fluid overload quite rapidly.  Goals of care - discussed with the daughter at bedside on the day of discharge. She has previously declined to discuss with palliative care.  He is overall quite sick, he has very advanced aortic stenosis not amenable to any intervention, he is hypernatremic despite fair by mouth intake however unable to take too much water because of his aortic stenosis. I recommend PCP or Dr. Burt Knack approach palliative care consultation as outpatient.  Hypokalemia - replaced Dementia - PTOT  evaluation prior to discharge, lives alone with private caregivers per the daughter at the bedside  Procedures:  None    Consultations:  Cardiology   Discharge Exam: Filed Vitals:   05/30/16 0735 05/30/16 1429 05/30/16 2109 05/31/16 0437  BP:  115/56 121/69 127/64  Pulse:  63 72 72  Temp:  98.3 F (36.8 C) 98.1 F (36.7 C) 98.1 F (36.7 C)  TempSrc:  Oral Oral Oral  Resp:  17 18 20   Height:      Weight:      SpO2: 98% 100% 95% 98%    General: NAD Cardiovascular: RRR, 4/6 SEM Respiratory: CTA biL  Discharge Instructions Activity:  As tolerated   Get Medicines reviewed and adjusted: Please take all your medications with you for your next visit with your Primary MD  Please request your Primary MD to go over all hospital tests and procedure/radiological results at the follow up, please ask your Primary MD to get all Hospital records sent to his/her office.  If you experience worsening of your admission symptoms, develop shortness of breath, life threatening emergency, suicidal or homicidal thoughts you must seek medical attention immediately by calling 911 or calling your MD immediately if symptoms less severe.  You must read complete instructions/literature along with all the possible adverse reactions/side effects for all the Medicines you take and that have been prescribed to you. Take any new Medicines after you have completely understood and accpet all the possible adverse reactions/side effects.   Do not drive when taking Pain medications.   Do not take more than prescribed Pain, Sleep and Anxiety Medications  Special Instructions: If you have smoked or chewed Tobacco in the last 2 yrs please stop smoking, stop any regular Alcohol and or any Recreational drug use.  Wear Seat belts while driving.  Please note  You were cared for by a hospitalist during your hospital stay. Once you are discharged, your primary care physician will handle any further medical  issues. Please note that NO REFILLS for any discharge medications will be authorized once you are discharged, as it is imperative that you return to your primary care physician (or establish a relationship with a primary care physician if you do not have one) for your aftercare needs so that they can reassess your need for medications and monitor your lab values.    Medication List    STOP taking these medications        amoxicillin-clavulanate 875-125 MG tablet  Commonly known as:  AUGMENTIN     ciprofloxacin 500 MG tablet  Commonly known as:  CIPRO     doxycycline 100 MG capsule  Commonly known as:  VIBRAMYCIN      TAKE these medications        albuterol (2.5 MG/3ML) 0.083% nebulizer solution  Commonly known as:  PROVENTIL  Take 3 mLs (2.5 mg total) by nebulization every 4 (four) hours as needed for wheezing or shortness of breath.     ALPRAZolam 0.25 MG tablet  Commonly known as:  XANAX  Take 0.5-1 tablets by mouth daily as needed for anxiety.     amLODipine 10 MG tablet  Commonly known as:  NORVASC  Take 10 mg by mouth daily.     aspirin 81 MG tablet  Take 81 mg by mouth daily.     atorvastatin 10 MG tablet  Commonly known as:  LIPITOR  Take 10 mg by mouth daily.     benzonatate 100 MG capsule  Commonly known as:  TESSALON  Take 1 capsule (100 mg total) by mouth every 8 (eight) hours.     cetirizine 10 MG tablet  Commonly known as:  ZYRTEC ALLERGY  Take 1 tablet (10 mg total) by mouth daily.     docusate sodium 100 MG capsule  Commonly known as:  COLACE  Take 1 capsule (100 mg total) by mouth every 12 (twelve) hours.     furosemide 40 MG tablet  Commonly known as:  LASIX  Take 0.5 tablets (20 mg total) by mouth daily.     hydrALAZINE 100 MG tablet  Commonly known as:  APRESOLINE  Take 50-100 mg by mouth 2 (two) times daily. Take 100 mg every morning and 50 mg every night.     ipratropium-albuterol 0.5-2.5 (3) MG/3ML Soln  Commonly known as:  DUONEB    Take 3 mLs by nebulization 2 (two) times daily.     labetalol 300 MG tablet  Commonly known as:  NORMODYNE  Take 1 tablet (300 mg total) by mouth 2 (two) times daily.     levofloxacin 500 MG tablet  Commonly known as:  LEVAQUIN  Take 1 tablet (500 mg total) by mouth daily.     pramoxine 1 % foam  Commonly known as:  PROCTOFOAM  Place 1 application rectally 3 (three) times daily as needed for itching.     RESOURCE THICKENUP CLEAR Powd  With every meal           Follow-up Information    Follow up with [facility].   Why:  For [facility]   Contact information:   phone: [phone] fax: [phone]      Follow up with Sherren Mocha, MD. Schedule an appointment as soon as possible for a visit in 1 week.   Specialty:  Cardiology   Contact information:   [id number] [place] [address] [phone]       The results of significant diagnostics from this hospitalization (including imaging, microbiology, ancillary and laboratory) are listed below for reference.    Significant Diagnostic Studies: X-ray Chest Pa And Lateral  05/30/2016  CLINICAL DATA:  Pneumonia. EXAM: CHEST  2 VIEW COMPARISON:  05/29/2016 . FINDINGS: Mediastinum and hilar structures are normal. Cardiomegaly. Left mid and lower lung infiltrates consistent pneumonia is again noted. Small left pleural effusion. No pneumothorax. IMPRESSION: 1. Cardiomegaly. 2. Left mid and lower lung lobe infiltrates consistent with pneumonia. No interim change from prior exam. Small left pleural effusion. Electronically Signed   By: Marcello Moores  Register   On: 05/30/2016 13:08   Dg Chest 2 View  05/29/2016  CLINICAL DATA:  Patient with shortness of breath and history of CHF. EXAM: CHEST  2 VIEW COMPARISON:  Chest radiograph 05/27/2016 FINDINGS: Interval worsening left mid and lower lung pulmonary consolidation. Probable small left pleural effusion. Stable enlarged cardiac and mediastinal contours. No  pneumothorax. Thoracic spine degenerative changes. IMPRESSION: Interval worsening left mid lower lung pulmonary consolidation concerning for pneumonia. Followup PA and lateral chest X-ray is recommended in 3-4 weeks following trial of antibiotic therapy to ensure resolution and exclude underlying malignancy. Electronically Signed   By: Dian Situ  Rosana Hoes M.D.   On: 05/29/2016 09:29   Dg Chest 2 View  05/27/2016  CLINICAL DATA:  Worsening cough EXAM: CHEST  2 VIEW COMPARISON:  04/18/2016 FINDINGS: Cardiac shadow is stable. The lungs are well aerated bilaterally. New left mid and lower lung infiltrate is noted. No bony abnormality is noted. IMPRESSION: New left mid and lower lung infiltrate. Electronically Signed   By: Inez Catalina M.D.   On: 05/27/2016 07:54    Microbiology: Recent Results (from the past 240 hour(s))  Blood culture (routine x 2)     Status: None (Preliminary result)   Collection Time: 05/29/16 10:24 AM  Result Value Ref Range Status   Specimen Description BLOOD RIGHT FOREARM  Final   Special Requests BOTTLES DRAWN AEROBIC AND ANAEROBIC  10CC  Final   Culture NO GROWTH 2 DAYS  Final   Report Status PENDING  Incomplete  Blood culture (routine x 2)     Status: None (Preliminary result)   Collection Time: 05/29/16 10:30 AM  Result Value Ref Range Status   Specimen Description BLOOD RIGHT HAND  Final   Special Requests BOTTLES DRAWN AEROBIC AND ANAEROBIC  10CC  Final   Culture NO GROWTH 2 DAYS  Final   Report Status PENDING  Incomplete     Labs: Basic Metabolic Panel:  Recent Labs Lab 05/29/16 1007 05/30/16 0556 05/31/16 0611  NA 151* 153* 155*  K 3.2* 3.0* 3.4*  CL 111 115* 116*  CO2 31 30 30   GLUCOSE 129* 96 123*  BUN 43* 36* 38*  CREATININE 1.02 0.79 0.88  CALCIUM 11.0* 10.7* 11.1*   Liver Function Tests: No results for input(s): AST, ALT, ALKPHOS, BILITOT, PROT, ALBUMIN in the last 168 hours. No results for input(s): LIPASE, AMYLASE in the last 168 hours. No  results for input(s): AMMONIA in the last 168 hours. CBC:  Recent Labs Lab 05/29/16 1007 05/30/16 0556 05/31/16 0611  WBC 6.6 6.7 7.9  NEUTROABS 5.6  --   --   HGB 10.7* 9.6* 10.1*  HCT 34.9* 32.0* 33.7*  MCV 86.6 87.2 88.0  PLT 145* 128* 135*   Cardiac Enzymes: No results for input(s): CKTOTAL, CKMB, CKMBINDEX, TROPONINI in the last 168 hours. BNP: BNP (last 3 results)  Recent Labs  05/29/16 1007 05/30/16 0556 05/31/16 0611  BNP >4500.0* >4500.0* 3650.2*    ProBNP (last 3 results) No results for input(s): PROBNP in the last 8760 hours.  CBG: No results for input(s): GLUCAP in the last 168 hours.     SignedMarzetta Board  [facility] 05/31/2016, 2:20 PM

## 2016-05-31 NOTE — Progress Notes (Signed)
SUBJECTIVE:  No complaints  OBJECTIVE:   Vitals:   Filed Vitals:   05/30/16 0735 05/30/16 1429 05/30/16 2109 05/31/16 0437  BP:  115/56 121/69 127/64  Pulse:  63 72 72  Temp:  98.3 F (36.8 C) 98.1 F (36.7 C) 98.1 F (36.7 C)  TempSrc:  Oral Oral Oral  Resp:  17 18 20   Height:      Weight:      SpO2: 98% 100% 95% 98%   I&O's:   Intake/Output Summary (Last 24 hours) at 05/31/16 1058 Last data filed at 05/31/16 0440  Gross per 24 hour  Intake    548 ml  Output    500 ml  Net     48 ml   TELEMETRY: Reviewed telemetry pt in NSR:     PHYSICAL EXAM General: Well developed,very cachectic in no acute distress Head: Eyes PERRLA, No xanthomas.   Normal cephalic and atramatic  Lungs:   Clear bilaterally to auscultation and percussion. Heart:   HRRR S1 S2 Pulses are 2+ & equal.            No carotid bruit. No JVD.  No abdominal bruits. No femoral bruits. Abdomen: Bowel sounds are positive, abdomen soft and non-tender without masses Extremities:   No clubbing, cyanosis or edema.  DP +1 Neuro: Alert and oriented X 3. Psych:  Good affect, responds appropriately   LABS: Basic Metabolic Panel:  Recent Labs  05/30/16 0556 05/31/16 0611  NA 153* 155*  K 3.0* 3.4*  CL 115* 116*  CO2 30 30  GLUCOSE 96 123*  BUN 36* 38*  CREATININE 0.79 0.88  CALCIUM 10.7* 11.1*   Liver Function Tests: No results for input(s): AST, ALT, ALKPHOS, BILITOT, PROT, ALBUMIN in the last 72 hours. No results for input(s): LIPASE, AMYLASE in the last 72 hours. CBC:  Recent Labs  05/29/16 1007 05/30/16 0556 05/31/16 0611  WBC 6.6 6.7 7.9  NEUTROABS 5.6  --   --   HGB 10.7* 9.6* 10.1*  HCT 34.9* 32.0* 33.7*  MCV 86.6 87.2 88.0  PLT 145* 128* 135*   Cardiac Enzymes: No results for input(s): CKTOTAL, CKMB, CKMBINDEX, TROPONINI in the last 72 hours. BNP: Invalid input(s): POCBNP D-Dimer: No results for input(s): DDIMER in the last 72 hours. Hemoglobin A1C: No results for input(s):  HGBA1C in the last 72 hours. Fasting Lipid Panel: No results for input(s): CHOL, HDL, LDLCALC, TRIG, CHOLHDL, LDLDIRECT in the last 72 hours. Thyroid Function Tests: No results for input(s): TSH, T4TOTAL, T3FREE, THYROIDAB in the last 72 hours.  Invalid input(s): FREET3 Anemia Panel: No results for input(s): VITAMINB12, FOLATE, FERRITIN, TIBC, IRON, RETICCTPCT in the last 72 hours. Coag Panel:   Lab Results  Component Value Date   INR 1.12 02/28/2011   INR 1.21 02/27/2011   INR 1.07 02/27/2011    RADIOLOGY: X-ray Chest Pa And Lateral  05/30/2016  CLINICAL DATA:  Pneumonia. EXAM: CHEST  2 VIEW COMPARISON:  05/29/2016 . FINDINGS: Mediastinum and hilar structures are normal. Cardiomegaly. Left mid and lower lung infiltrates consistent pneumonia is again noted. Small left pleural effusion. No pneumothorax. IMPRESSION: 1. Cardiomegaly. 2. Left mid and lower lung lobe infiltrates consistent with pneumonia. No interim change from prior exam. Small left pleural effusion. Electronically Signed   By: Marcello Moores  Register   On: 05/30/2016 13:08   Dg Chest 2 View  05/29/2016  CLINICAL DATA:  Patient with shortness of breath and history of CHF. EXAM: CHEST  2 VIEW COMPARISON:  Chest radiograph 05/27/2016 FINDINGS: Interval worsening left mid and lower lung pulmonary consolidation. Probable small left pleural effusion. Stable enlarged cardiac and mediastinal contours. No pneumothorax. Thoracic spine degenerative changes. IMPRESSION: Interval worsening left mid lower lung pulmonary consolidation concerning for pneumonia. Followup PA and lateral chest X-ray is recommended in 3-4 weeks following trial of antibiotic therapy to ensure resolution and exclude underlying malignancy. Electronically Signed   By: Lovey Newcomer M.D.   On: 05/29/2016 09:29   Dg Chest 2 View  05/27/2016  CLINICAL DATA:  Worsening cough EXAM: CHEST  2 VIEW COMPARISON:  04/18/2016 FINDINGS: Cardiac shadow is stable. The lungs are well aerated  bilaterally. New left mid and lower lung infiltrate is noted. No bony abnormality is noted. IMPRESSION: New left mid and lower lung infiltrate. Electronically Signed   By: Inez Catalina M.D.   On: 05/27/2016 07:54    Assessment/Plan Active Problems:  Stroke, small vessel (HCC)  Hypertension  Chronic renal insufficiency, stage III (moderate)  Severe aortic stenosis  Acute dyspnea  CAP (community acquired pneumonia)  Acute on chronic diastolic CHF with known severe AS and not candidate for TAVR. His BNP is significantly elevated-more than previously > 4500. No dyspnea currently. He does not appear volume overloaded on exam today.  Would give any more IV Lasix.  Would discharge home on Lasix 20mg  daily.    SOB - multifactorial due to PNA and volume overload from diastolic CHF.    HTN controlled.  Severe AS - daughter aware that he will develop progressive SOB but she does not wish to discuss palliative care at this time, he has been fairly stable since discussion with Dr. Burt Knack 03/2015 from cardiac issue.   Needs to get in to see Dr. Burt Knack soon and hopefully he can start discussion of palliative care.  PNA - on antibx per IM  Hypernatremia - IV diuretics stopped   Fransico Him, MD  05/31/2016  10:58 AM

## 2016-05-31 NOTE — Discharge Instructions (Signed)
Follow with Mickie HillierLITTLE,KEVIN LORNE, MD in 5-7 days  Please get a complete blood count and chemistry panel checked by your Primary MD at your next visit, and again as instructed by your Primary MD. Please get your medications reviewed and adjusted by your Primary MD.  Please request your Primary MD to go over all Hospital Tests and Procedure/Radiological results at the follow up, please get all Hospital records sent to your Prim MD by signing hospital release before you go home.  If you had Pneumonia of Lung problems at the Hospital: Please get a 2 view Chest X ray done in 6-8 weeks after hospital discharge or sooner if instructed by your Primary MD.  If you have Congestive Heart Failure: Please call your Cardiologist or Primary MD anytime you have any of the following symptoms:  1) 3 pound weight gain in 24 hours or 5 pounds in 1 week  2) shortness of breath, with or without a dry hacking cough  3) swelling in the hands, feet or stomach  4) if you have to sleep on extra pillows at night in order to breathe  Follow cardiac low salt diet and 1.5 lit/day fluid restriction.  If you have diabetes Accuchecks 4 times/day, Once in AM empty stomach and then before each meal. Log in all results and show them to your primary doctor at your next visit. If any glucose reading is under 80 or above 300 call your primary MD immediately.  If you have Seizure/Convulsions/Epilepsy: Please do not drive, operate heavy machinery, participate in activities at heights or participate in high speed sports until you have seen by Primary MD or a Neurologist and advised to do so again.  If you had Gastrointestinal Bleeding: Please ask your Primary MD to check a complete blood count within one week of discharge or at your next visit. Your endoscopic/colonoscopic biopsies that are pending at the time of discharge, will also need to followed by your Primary MD.  Get Medicines reviewed and adjusted. Please take all your  medications with you for your next visit with your Primary MD  Please request your Primary MD to go over all hospital tests and procedure/radiological results at the follow up, please ask your Primary MD to get all Hospital records sent to his/her office.  If you experience worsening of your admission symptoms, develop shortness of breath, life threatening emergency, suicidal or homicidal thoughts you must seek medical attention immediately by calling 911 or calling your MD immediately  if symptoms less severe.  You must read complete instructions/literature along with all the possible adverse reactions/side effects for all the Medicines you take and that have been prescribed to you. Take any new Medicines after you have completely understood and accpet all the possible adverse reactions/side effects.   Do not drive or operate heavy machinery when taking Pain medications.   Do not take more than prescribed Pain, Sleep and Anxiety Medications  Special Instructions: If you have smoked or chewed Tobacco  in the last 2 yrs please stop smoking, stop any regular Alcohol  and or any Recreational drug use.  Wear Seat belts while driving.  Please note You were cared for by a hospitalist during your hospital stay. If you have any questions about your discharge medications or the care you received while you were in the hospital after you are discharged, you can call the unit and asked to speak with the hospitalist on call if the hospitalist that took care of you is not available. Once  you are discharged, your primary care physician will handle any further medical issues. Please note that NO REFILLS for any discharge medications will be authorized once you are discharged, as it is imperative that you return to your primary care physician (or establish a relationship with a primary care physician if you do not have one) for your aftercare needs so that they can reassess your need for medications and monitor your  lab values.  You can reach the hospitalist office at phone [phone] or fax [phone]   If you do not have a primary care physician, you can call [phone] for a physician referral.  Activity: As tolerated with Full fall precautions use walker/cane & assistance as needed  Diet: heart healthy  Disposition Home

## 2016-06-03 LAB — CULTURE, BLOOD (ROUTINE X 2)
CULTURE: NO GROWTH
CULTURE: NO GROWTH

## 2016-06-04 ENCOUNTER — Inpatient Hospital Stay (HOSPITAL_COMMUNITY)
Admission: EM | Admit: 2016-06-04 | Discharge: 2016-06-10 | DRG: 871 | Disposition: A | Discharge status: Hospice | Payer: Medicare Other | Attending: Internal Medicine | Admitting: Internal Medicine

## 2016-06-04 ENCOUNTER — Emergency Department (HOSPITAL_COMMUNITY): Payer: Medicare Other

## 2016-06-04 ENCOUNTER — Encounter (HOSPITAL_COMMUNITY): Payer: Self-pay | Admitting: Emergency Medicine

## 2016-06-04 DIAGNOSIS — E870 Hyperosmolality and hypernatremia: Secondary | ICD-10-CM | POA: Diagnosis present

## 2016-06-04 DIAGNOSIS — E049 Nontoxic goiter, unspecified: Secondary | ICD-10-CM | POA: Diagnosis present

## 2016-06-04 DIAGNOSIS — I5032 Chronic diastolic (congestive) heart failure: Secondary | ICD-10-CM | POA: Diagnosis present

## 2016-06-04 DIAGNOSIS — Z8673 Personal history of transient ischemic attack (TIA), and cerebral infarction without residual deficits: Secondary | ICD-10-CM | POA: Diagnosis not present

## 2016-06-04 DIAGNOSIS — I69354 Hemiplegia and hemiparesis following cerebral infarction affecting left non-dominant side: Secondary | ICD-10-CM

## 2016-06-04 DIAGNOSIS — N179 Acute kidney failure, unspecified: Secondary | ICD-10-CM | POA: Diagnosis present

## 2016-06-04 DIAGNOSIS — N390 Urinary tract infection, site not specified: Secondary | ICD-10-CM | POA: Diagnosis present

## 2016-06-04 DIAGNOSIS — E876 Hypokalemia: Secondary | ICD-10-CM | POA: Diagnosis present

## 2016-06-04 DIAGNOSIS — Z66 Do not resuscitate: Secondary | ICD-10-CM | POA: Diagnosis present

## 2016-06-04 DIAGNOSIS — E871 Hypo-osmolality and hyponatremia: Secondary | ICD-10-CM | POA: Diagnosis present

## 2016-06-04 DIAGNOSIS — R946 Abnormal results of thyroid function studies: Secondary | ICD-10-CM | POA: Diagnosis present

## 2016-06-04 DIAGNOSIS — L8990 Pressure ulcer of unspecified site, unspecified stage: Secondary | ICD-10-CM | POA: Diagnosis present

## 2016-06-04 DIAGNOSIS — E785 Hyperlipidemia, unspecified: Secondary | ICD-10-CM | POA: Diagnosis present

## 2016-06-04 DIAGNOSIS — J9601 Acute respiratory failure with hypoxia: Secondary | ICD-10-CM | POA: Diagnosis present

## 2016-06-04 DIAGNOSIS — Y95 Nosocomial condition: Secondary | ICD-10-CM | POA: Diagnosis present

## 2016-06-04 DIAGNOSIS — E872 Acidosis: Secondary | ICD-10-CM | POA: Diagnosis present

## 2016-06-04 DIAGNOSIS — R5383 Other fatigue: Secondary | ICD-10-CM | POA: Diagnosis present

## 2016-06-04 DIAGNOSIS — I959 Hypotension, unspecified: Secondary | ICD-10-CM | POA: Diagnosis present

## 2016-06-04 DIAGNOSIS — I130 Hypertensive heart and chronic kidney disease with heart failure and stage 1 through stage 4 chronic kidney disease, or unspecified chronic kidney disease: Secondary | ICD-10-CM | POA: Diagnosis present

## 2016-06-04 DIAGNOSIS — T502X5A Adverse effect of carbonic-anhydrase inhibitors, benzothiadiazides and other diuretics, initial encounter: Secondary | ICD-10-CM | POA: Diagnosis present

## 2016-06-04 DIAGNOSIS — E860 Dehydration: Secondary | ICD-10-CM | POA: Diagnosis present

## 2016-06-04 DIAGNOSIS — Z7982 Long term (current) use of aspirin: Secondary | ICD-10-CM | POA: Diagnosis not present

## 2016-06-04 DIAGNOSIS — I10 Essential (primary) hypertension: Secondary | ICD-10-CM | POA: Diagnosis present

## 2016-06-04 DIAGNOSIS — R1310 Dysphagia, unspecified: Secondary | ICD-10-CM | POA: Diagnosis present

## 2016-06-04 DIAGNOSIS — F0390 Unspecified dementia without behavioral disturbance: Secondary | ICD-10-CM | POA: Diagnosis present

## 2016-06-04 DIAGNOSIS — R0602 Shortness of breath: Secondary | ICD-10-CM

## 2016-06-04 DIAGNOSIS — Z515 Encounter for palliative care: Secondary | ICD-10-CM | POA: Diagnosis present

## 2016-06-04 DIAGNOSIS — Z79899 Other long term (current) drug therapy: Secondary | ICD-10-CM | POA: Diagnosis not present

## 2016-06-04 DIAGNOSIS — R64 Cachexia: Secondary | ICD-10-CM | POA: Diagnosis present

## 2016-06-04 DIAGNOSIS — R05 Cough: Secondary | ICD-10-CM

## 2016-06-04 DIAGNOSIS — E43 Unspecified severe protein-calorie malnutrition: Secondary | ICD-10-CM | POA: Diagnosis present

## 2016-06-04 DIAGNOSIS — N183 Chronic kidney disease, stage 3 unspecified: Secondary | ICD-10-CM | POA: Diagnosis present

## 2016-06-04 DIAGNOSIS — I350 Nonrheumatic aortic (valve) stenosis: Secondary | ICD-10-CM | POA: Diagnosis present

## 2016-06-04 DIAGNOSIS — R627 Adult failure to thrive: Secondary | ICD-10-CM | POA: Diagnosis present

## 2016-06-04 DIAGNOSIS — J690 Pneumonitis due to inhalation of food and vomit: Secondary | ICD-10-CM | POA: Diagnosis present

## 2016-06-04 DIAGNOSIS — J189 Pneumonia, unspecified organism: Secondary | ICD-10-CM | POA: Diagnosis present

## 2016-06-04 DIAGNOSIS — N2889 Other specified disorders of kidney and ureter: Secondary | ICD-10-CM | POA: Diagnosis present

## 2016-06-04 DIAGNOSIS — A419 Sepsis, unspecified organism: Secondary | ICD-10-CM | POA: Diagnosis present

## 2016-06-04 DIAGNOSIS — D472 Monoclonal gammopathy: Secondary | ICD-10-CM | POA: Diagnosis present

## 2016-06-04 DIAGNOSIS — Z8249 Family history of ischemic heart disease and other diseases of the circulatory system: Secondary | ICD-10-CM | POA: Diagnosis not present

## 2016-06-04 DIAGNOSIS — R748 Abnormal levels of other serum enzymes: Secondary | ICD-10-CM | POA: Diagnosis present

## 2016-06-04 DIAGNOSIS — R778 Other specified abnormalities of plasma proteins: Secondary | ICD-10-CM | POA: Diagnosis present

## 2016-06-04 DIAGNOSIS — R059 Cough, unspecified: Secondary | ICD-10-CM

## 2016-06-04 DIAGNOSIS — N170 Acute kidney failure with tubular necrosis: Secondary | ICD-10-CM | POA: Diagnosis present

## 2016-06-04 DIAGNOSIS — I639 Cerebral infarction, unspecified: Secondary | ICD-10-CM

## 2016-06-04 DIAGNOSIS — R34 Anuria and oliguria: Secondary | ICD-10-CM | POA: Diagnosis present

## 2016-06-04 DIAGNOSIS — N189 Chronic kidney disease, unspecified: Secondary | ICD-10-CM | POA: Diagnosis not present

## 2016-06-04 DIAGNOSIS — Z681 Body mass index (BMI) 19 or less, adult: Secondary | ICD-10-CM

## 2016-06-04 DIAGNOSIS — D649 Anemia, unspecified: Secondary | ICD-10-CM | POA: Diagnosis present

## 2016-06-04 DIAGNOSIS — G9341 Metabolic encephalopathy: Secondary | ICD-10-CM | POA: Diagnosis present

## 2016-06-04 DIAGNOSIS — N19 Unspecified kidney failure: Secondary | ICD-10-CM

## 2016-06-04 DIAGNOSIS — R7989 Other specified abnormal findings of blood chemistry: Secondary | ICD-10-CM | POA: Diagnosis present

## 2016-06-04 DIAGNOSIS — D696 Thrombocytopenia, unspecified: Secondary | ICD-10-CM | POA: Diagnosis present

## 2016-06-04 HISTORY — DX: Pneumonia, unspecified organism: J18.9

## 2016-06-04 LAB — I-STAT CG4 LACTIC ACID, ED
LACTIC ACID, VENOUS: 2.08 mmol/L — AB (ref 0.5–2.0)
Lactic Acid, Venous: 2.14 mmol/L (ref 0.5–2.0)

## 2016-06-04 LAB — CBC WITH DIFFERENTIAL/PLATELET
BASOS ABS: 0 10*3/uL (ref 0.0–0.1)
BASOS PCT: 0 %
EOS ABS: 0 10*3/uL (ref 0.0–0.7)
Eosinophils Relative: 0 %
HEMATOCRIT: 30.8 % — AB (ref 39.0–52.0)
HEMOGLOBIN: 9.9 g/dL — AB (ref 13.0–17.0)
Lymphocytes Relative: 9 %
Lymphs Abs: 1.1 10*3/uL (ref 0.7–4.0)
MCH: 27.3 pg (ref 26.0–34.0)
MCHC: 32.1 g/dL (ref 30.0–36.0)
MCV: 84.8 fL (ref 78.0–100.0)
Monocytes Absolute: 0.3 10*3/uL (ref 0.1–1.0)
Monocytes Relative: 3 %
NEUTROS ABS: 11.1 10*3/uL — AB (ref 1.7–7.7)
NEUTROS PCT: 88 %
Platelets: 120 10*3/uL — ABNORMAL LOW (ref 150–400)
RBC: 3.63 MIL/uL — ABNORMAL LOW (ref 4.22–5.81)
RDW: 14.3 % (ref 11.5–15.5)
WBC: 12.5 10*3/uL — ABNORMAL HIGH (ref 4.0–10.5)

## 2016-06-04 LAB — COMPREHENSIVE METABOLIC PANEL
ALK PHOS: 64 U/L (ref 38–126)
ALT: 30 U/L (ref 17–63)
ANION GAP: 13 (ref 5–15)
AST: 47 U/L — ABNORMAL HIGH (ref 15–41)
Albumin: 2.5 g/dL — ABNORMAL LOW (ref 3.5–5.0)
BILIRUBIN TOTAL: 1.1 mg/dL (ref 0.3–1.2)
BUN: 100 mg/dL — ABNORMAL HIGH (ref 6–20)
CALCIUM: 10.2 mg/dL (ref 8.9–10.3)
CO2: 22 mmol/L (ref 22–32)
CREATININE: 4.37 mg/dL — AB (ref 0.61–1.24)
Chloride: 127 mmol/L — ABNORMAL HIGH (ref 101–111)
GFR, EST AFRICAN AMERICAN: 14 mL/min — AB (ref >60)
GFR, EST NON AFRICAN AMERICAN: 12 mL/min — AB (ref >60)
Glucose, Bld: 92 mg/dL (ref 65–99)
Potassium: 3.3 mmol/L — ABNORMAL LOW (ref 3.5–5.1)
Sodium: 162 mmol/L (ref 135–145)
TOTAL PROTEIN: 6.1 g/dL — AB (ref 6.5–8.1)

## 2016-06-04 LAB — URINALYSIS, ROUTINE W REFLEX MICROSCOPIC
Glucose, UA: NEGATIVE mg/dL
KETONES UR: NEGATIVE mg/dL
NITRITE: NEGATIVE
Protein, ur: >300 mg/dL — AB
Specific Gravity, Urine: 1.023 (ref 1.005–1.030)
pH: 5.5 (ref 5.0–8.0)

## 2016-06-04 LAB — URINE MICROSCOPIC-ADD ON

## 2016-06-04 MED ORDER — ACETAMINOPHEN 325 MG PO TABS: 650.0000 mg | ORAL_TABLET | Freq: Four times a day (QID) | ORAL | Status: DC | PRN

## 2016-06-04 MED ORDER — SODIUM CHLORIDE 0.9 % IV BOLUS (SEPSIS)
1000.0000 mL | Freq: Once | INTRAVENOUS | Status: AC
  Administered 2016-06-04: 1000 mL via INTRAVENOUS

## 2016-06-04 MED ORDER — DEXTROSE 5 % IV SOLN
INTRAVENOUS | Status: DC
  Administered 2016-06-04 – 2016-06-05 (×2): via INTRAVENOUS

## 2016-06-04 MED ORDER — DEXTROSE 5 % IV SOLN
2.0000 g | Freq: Once | INTRAVENOUS | Status: AC
  Administered 2016-06-04: 2 g via INTRAVENOUS
  Filled 2016-06-04: qty 2

## 2016-06-04 MED ORDER — ONDANSETRON HCL 4 MG/2ML IJ SOLN: 4.0000 mg | Freq: Four times a day (QID) | INTRAMUSCULAR | Status: DC | PRN

## 2016-06-04 MED ORDER — IPRATROPIUM-ALBUTEROL 0.5-2.5 (3) MG/3ML IN SOLN
3.0000 mL | Freq: Two times a day (BID) | RESPIRATORY_TRACT | Status: DC
  Administered 2016-06-04 – 2016-06-10 (×12): 3 mL via RESPIRATORY_TRACT
  Filled 2016-06-04 (×10): qty 3

## 2016-06-04 MED ORDER — DEXTROSE 5 % IV SOLN: 1.0000 g | Freq: Three times a day (TID) | INTRAVENOUS | Status: DC

## 2016-06-04 MED ORDER — HYDRALAZINE HCL 20 MG/ML IJ SOLN: 10.0000 mg | INTRAMUSCULAR | Status: DC | PRN

## 2016-06-04 MED ORDER — ALBUTEROL SULFATE (2.5 MG/3ML) 0.083% IN NEBU: 2.5000 mg | INHALATION_SOLUTION | RESPIRATORY_TRACT | Status: DC | PRN

## 2016-06-04 MED ORDER — CHLORHEXIDINE GLUCONATE 0.12 % MT SOLN
15.0000 mL | Freq: Two times a day (BID) | OROMUCOSAL | Status: DC
  Administered 2016-06-05: 15 mL via OROMUCOSAL
  Filled 2016-06-04: qty 15

## 2016-06-04 MED ORDER — ONDANSETRON HCL 4 MG PO TABS: 4.0000 mg | ORAL_TABLET | Freq: Four times a day (QID) | ORAL | Status: DC | PRN

## 2016-06-04 MED ORDER — VANCOMYCIN HCL IN DEXTROSE 1-5 GM/200ML-% IV SOLN
1000.0000 mg | Freq: Once | INTRAVENOUS | Status: AC
  Administered 2016-06-04: 1000 mg via INTRAVENOUS
  Filled 2016-06-04: qty 200

## 2016-06-04 MED ORDER — SODIUM CHLORIDE 0.9 % IV SOLN
1000.0000 mL | INTRAVENOUS | Status: DC
  Administered 2016-06-04: 1000 mL via INTRAVENOUS

## 2016-06-04 MED ORDER — DEXTROSE 5 % IV SOLN
500.0000 mg | INTRAVENOUS | Status: DC
  Administered 2016-06-05 – 2016-06-06 (×2): 500 mg via INTRAVENOUS
  Filled 2016-06-04 (×2): qty 0.5

## 2016-06-04 MED ORDER — ACETAMINOPHEN 650 MG RE SUPP
650.0000 mg | Freq: Four times a day (QID) | RECTAL | Status: DC | PRN
  Administered 2016-06-05: 650 mg via RECTAL
  Filled 2016-06-04: qty 1

## 2016-06-04 MED ORDER — HEPARIN SODIUM (PORCINE) 5000 UNIT/ML IJ SOLN
5000.0000 [IU] | Freq: Three times a day (TID) | INTRAMUSCULAR | Status: DC
  Administered 2016-06-04 – 2016-06-05 (×3): 5000 [IU] via SUBCUTANEOUS
  Filled 2016-06-04 (×5): qty 1

## 2016-06-04 MED ORDER — CETYLPYRIDINIUM CHLORIDE 0.05 % MT LIQD
7.0000 mL | Freq: Two times a day (BID) | OROMUCOSAL | Status: DC
  Administered 2016-06-05 – 2016-06-08 (×3): 7 mL via OROMUCOSAL

## 2016-06-04 MED ORDER — SODIUM CHLORIDE 0.9 % IV BOLUS (SEPSIS)
250.0000 mL | Freq: Once | INTRAVENOUS | Status: AC
Start: 2016-06-04 — End: 2016-06-04
  Administered 2016-06-04: 250 mL via INTRAVENOUS

## 2016-06-04 NOTE — ED Provider Notes (Signed)
CSN: [id number]     Arrival date & time 06/04/16  1753 History   First MD Initiated Contact with Patient 06/04/16 1822     Chief Complaint  Patient presents with  . Pneumonia     (Consider location/radiation/quality/duration/timing/severity/associated sxs/prior Treatment) HPI Comments: Patient here due to decreased level of consciousness as well as increased dyspnea. Recently discharged from the hospital for pneumonia and according to the daughter has had decreasing mental status. No fever or vomiting. Patient has been compliant with his meds. Does have some underlying dementia but the daughter says he has been non-communicative over the past 2 days. Called her physician and was told to come here.  Patient is a 76 y.o. male presenting with pneumonia. The history is provided by the patient. The history is limited by the condition of the patient.  Pneumonia    Past Medical History  Diagnosis Date  . Stroke ([place])   . Hypertension   . Hyperlipidemia   . Aortic stenosis   . CHF (congestive heart failure) ([place])   . MGUS (monoclonal gammopathy of unknown significance)   . Goiter   . Rhabdomyolysis   . UTI (urinary tract infection) 05/09/2014  . Dementia   . Pneumonia    Past Surgical History  Procedure Laterality Date  . Circumcision     Family History  Problem Relation Age of Onset  . Family history unknown: Yes   Social History  Substance Use Topics  . Smoking status: Never Smoker   . Smokeless tobacco: Never Used  . Alcohol Use: No    Review of Systems  Unable to perform ROS: Acuity of condition      Allergies  Review of patient's allergies indicates no known allergies.  Home Medications   Prior to Admission medications   Medication Sig Start Date End Date Taking? Authorizing Provider  albuterol (PROVENTIL) (2.5 MG/3ML) 0.083% nebulizer solution Take 3 mLs (2.5 mg total) by nebulization every 4 (four) hours as needed for wheezing or shortness of breath. 04/02/15    Shanker Kristeen Mans, MD  ALPRAZolam Duanne Moron) 0.25 MG tablet Take 0.5-1 tablets by mouth daily as needed for anxiety.  09/11/15   Historical Provider, MD  amLODipine (NORVASC) 10 MG tablet Take 10 mg by mouth daily.    Historical Provider, MD  aspirin 81 MG tablet Take 81 mg by mouth daily.    Historical Provider, MD  atorvastatin (LIPITOR) 10 MG tablet Take 10 mg by mouth daily.    Historical Provider, MD  benzonatate (TESSALON) 100 MG capsule Take 1 capsule (100 mg total) by mouth every 8 (eight) hours. 04/18/16   Frederica Kuster, PA-C  cetirizine (ZYRTEC ALLERGY) 10 MG tablet Take 1 tablet (10 mg total) by mouth daily. 04/18/16   Frederica Kuster, PA-C  docusate sodium (COLACE) 100 MG capsule Take 1 capsule (100 mg total) by mouth every 12 (twelve) hours. 06/19/15   Dorie Rank, MD  furosemide (LASIX) 40 MG tablet Take 0.5 tablets (20 mg total) by mouth daily. 05/31/16   Costin Karlyne Greenspan, MD  hydrALAZINE (APRESOLINE) 100 MG tablet Take 50-100 mg by mouth 2 (two) times daily. Take 100 mg every morning and 50 mg every night. 06/14/15   Historical Provider, MD  ipratropium-albuterol (DUONEB) 0.5-2.5 (3) MG/3ML SOLN Take 3 mLs by nebulization 2 (two) times daily. 04/05/15   Shanker Kristeen Mans, MD  labetalol (NORMODYNE) 300 MG tablet Take 1 tablet (300 mg total) by mouth 2 (two) times daily. 05/09/14   [name]  York, PA-C  levofloxacin (LEVAQUIN) 500 MG tablet Take 1 tablet (500 mg total) by mouth daily. 05/31/16   Costin Karlyne Greenspan, MD  Maltodextrin-Xanthan Gum (RESOURCE THICKENUP CLEAR) POWD With every meal 11/12/15   Hosie Poisson, MD  pramoxine (PROCTOFOAM) 1 % foam Place 1 application rectally 3 (three) times daily as needed for itching. 06/19/15   Dorie Rank, MD   BP 75/67 mmHg  Pulse 75  Temp(Src) 97.9 F (36.6 C) (Oral)  Resp 24  Ht 5\' 10"  (1.778 m)  Wt 39.009 kg  BMI 12.34 kg/m2  SpO2 78% Physical Exam  Constitutional: He appears lethargic. He appears cachectic. He appears toxic. He has a sickly  appearance. He appears ill. He appears distressed.  HENT:  Head: Normocephalic and atraumatic.  Eyes: Conjunctivae, EOM and lids are normal. Pupils are equal, round, and reactive to light.  Neck: Normal range of motion. Neck supple. No tracheal deviation present. No thyroid mass present.  Cardiovascular: Regular rhythm.  Tachycardia present.  Exam reveals no gallop.   Murmur heard.  Crescendo systolic murmur is present with a grade of 6/6  Pulmonary/Chest: Effort normal and breath sounds normal. No stridor. No respiratory distress. He has no decreased breath sounds. He has no wheezes. He has no rhonchi. He has no rales.  Abdominal: Soft. Normal appearance and bowel sounds are normal. He exhibits no distension. There is no tenderness. There is no rebound and no CVA tenderness.  Musculoskeletal: Normal range of motion. He exhibits no edema or tenderness.  Neurological: He appears lethargic. He displays atrophy. GCS eye subscore is 2. GCS verbal subscore is 2. GCS motor subscore is 4.  Skin: Skin is warm and dry. No abrasion and no rash noted.  Psychiatric: He has a normal mood and affect. His speech is normal and behavior is normal.  Nursing note and vitals reviewed.   ED Course  Procedures (including critical care time) Labs Review Labs Reviewed  CULTURE, BLOOD (ROUTINE X 2)  CULTURE, BLOOD (ROUTINE X 2)  URINE CULTURE  COMPREHENSIVE METABOLIC PANEL  CBC WITH DIFFERENTIAL/PLATELET  URINALYSIS, ROUTINE W REFLEX MICROSCOPIC (NOT AT [facility])  I-STAT CG4 LACTIC ACID, ED    Imaging Review No results found. I have personally reviewed and evaluated these images and lab results as part of my medical decision-making.   EKG Interpretation   Date/Time:  Wednesday June 04 2016 18:26:32 EDT Ventricular Rate:  78 PR Interval:  244 QRS Duration: 76 QT Interval:  418 QTC Calculation: 476 R Axis:   68 Text Interpretation:  Sinus or ectopic atrial rhythm Prolonged PR interval  LVH with  secondary repolarization abnormality Borderline prolonged QT  interval Confirmed by Zenia Resides  MD, Gissella Niblack ([id number]) on 06/04/2016 8:36:11 PM      MDM   Final diagnoses:  None    Patient given IV fluids for possible code sepsis as well as started on IV antibiotics. Labs show renal failure which is new from prior. A long discussion with the daughter concerning patient's CODE STATUS and she has agreed to make him a DO NOT RESUSCITATE. Patient to be admitted to the hospitalist. CRITICAL CARE Performed by: Leota Jacobsen Total critical care time: 50 minutes Critical care time was exclusive of separately billable procedures and treating other patients. Critical care was necessary to treat or prevent imminent or life-threatening deterioration. Critical care was time spent personally by me on the following activities: development of treatment plan with patient and/or surrogate as well as nursing, discussions with consultants, evaluation of patient's  response to treatment, examination of patient, obtaining history from patient or surrogate, ordering and performing treatments and interventions, ordering and review of laboratory studies, ordering and review of radiographic studies, pulse oximetry and re-evaluation of patient's condition.      Lacretia Leigh, MD 06/04/16 2036

## 2016-06-04 NOTE — Progress Notes (Signed)
[name] consulted for home health needs.  [name] went to speak to patient at bedside, however, MD in room performing procedure.  Patient noted to have been admitted and discharged from the hospital from 06/01 to 06/03.  Per chart review, patient has 24/7 supervision by family.  Patient also receives PCS services M-F.  Pcp listed as Dr. Hulan Fess.  Last CM note reports patient chose [facility] for home health PT.  Patient has had [name] home health services in the past.  Patient with recent hospitalization for pneumonia and discharged home to finish 5 days of Levaquin.  Patient on Bipap in ED.

## 2016-06-04 NOTE — Progress Notes (Signed)
Pt transported to ICU on NIV/PC without complication, RT to monitor and assess as needed.

## 2016-06-04 NOTE — ED Notes (Signed)
MD at bedside. 

## 2016-06-04 NOTE — Progress Notes (Addendum)
Pharmacy Antibiotic Note  Bradley [name] is a 75 y.o. male admitted on 06/04/2016 septic 2/2  pneumonia.  Patient was recently admitted (6/1-6/3) for a worsening pneumonia and was treated for CAP with azithromycin/ceftriaxone then transitioned to Levaquin.  Pharmacy has been consulted for Vancomycin and Cefepime dosing.  Code sepsis called.  First doses of Vancomycin 1g and Cefepime 2g already ordered [name].  Plan: Today's SCr pending. Waiting on results to determine maintenance dosing.  ADDENDUM: 06/04/2016 8:21 PM Patient with significant AKI, SCr 4.37. CrCl~8 ml/min.  Noted patient low body weight documented by ED. Plan: - Will hold off on scheduling any vancomycin maintenance dosing at this time.  F/u SCr and levels and dose vancomycin as appropriate.  Recommend ordering MRSA screen. - Cefepime 500 mg IV q24h.  Height: 5\' 10"  (177.8 cm) Weight: 86 lb (39.009 kg) IBW/kg (Calculated) : 73  Temp (24hrs), Avg:97.9 F (36.6 C), Min:97.9 F (36.6 C), Max:97.9 F (36.6 C)   Recent Labs Lab 05/29/16 1007 05/29/16 1010 05/29/16 1458 05/30/16 0556 05/31/16 0611  WBC 6.6  --   --  6.7 7.9  CREATININE 1.02  --   --  0.79 0.88  LATICACIDVEN  --  2.26* 1.2  --   --     Estimated Creatinine Clearance: 40.6 mL/min (by C-G formula based on Cr of 0.88).    No Known Allergies  Antimicrobials this admission: 6/7 Vanc >>  6/7 Cefepime >>   Dose adjustments this admission: -  Microbiology results: 6/7 BCx: sent 6/7 UCx: sent   Thank you for allowing pharmacy to be a part of this patient's care.  Hershal Coria 06/04/2016 6:36 PM

## 2016-06-04 NOTE — ED Notes (Signed)
RN at bedside starting IV/ collecting labs.

## 2016-06-04 NOTE — ED Notes (Addendum)
Patient was discharged on Saturday for pneumonia. Since, patient has been deteriorating since. Per family, patient has not been eating, drinking, talking, or ambulating since. Hx of dementia and stroke (right sided weakness and swallowing disorder)

## 2016-06-04 NOTE — ED Notes (Signed)
Unable to obtain labs at this time. Patient has 1 IV with no blood return. Need blood cultures to start antibiotics. MD aware.

## 2016-06-04 NOTE — ED Notes (Signed)
Dr. Zenia Resides and Jenny Reichmann, RN notified of critical Na+ 162.  No new orders at this time.

## 2016-06-04 NOTE — ED Notes (Addendum)
MD obtained 1 set of blood cultures and other blood work through [facility]. Unable to obtain 2nd set of blood cultures at this time. Zenia Resides, MD stated we can start antibiotics

## 2016-06-04 NOTE — ED Notes (Signed)
Respiratory therapy at bedside to put patient on Bi-Pap

## 2016-06-04 NOTE — ED Notes (Signed)
elevated lab, MD Zenia Resides have been info

## 2016-06-04 NOTE — H&P (Signed)
History and Physical    Bradley [name] [id number] DOB: 24-Feb-1941 DOA: 06/04/2016  PCP: [name], MD  Patient coming from: Home.  History obtained from patient's daughter wife as patient is lethargic.  Chief Complaint: Lethargic.  HPI: Bradley [name] is a 75 y.o. male with medical history significant of recent admitted for pneumonia was brought to the ER after patient became lethargic and decreased response. Patient's family states that since discharge patient has become progressively weak and over the last 2 days has hardly eaten anything but did take his medications yesterday. Today patient became very lethargic with hardly any response and patient was brought to the ER. Initially patient was mildly hypotensive and was given fluid bolus. Labs showing acute renal failure with creatinine around 4 with normal creatinine just 4 days ago. Patient's sodium is also around 161. After admission patient is becoming more responsive and following commands. Patient is also in respiratory failure requiring BiPAP. Chest x-ray shows stable infiltrates. Patient's family at this time as requested no intubation. Patient has history of stroke with difficulty speaking. As per the family patient has become more bedbound recently. Previously used to use a walker. Patient also has severe aortic stenosis and cardiology has felt patient is not a candidate for intervention due to poor functional capacity.  ED Course: Patient was given fluid bolus and was placed on BiPAP and started on empiric antibiotics for pneumonia.  Review of Systems: As per HPI, rest all negative.   Past Medical History  Diagnosis Date  . Stroke ([place])   . Hypertension   . Hyperlipidemia   . Aortic stenosis   . CHF (congestive heart failure) ([place])   . MGUS (monoclonal gammopathy of unknown significance)   . Goiter   . Rhabdomyolysis   . UTI (urinary tract infection) 05/09/2014  . Dementia   . Pneumonia     Past Surgical  History  Procedure Laterality Date  . Circumcision       reports that he has never smoked. He has never used smokeless tobacco. He reports that he does not drink alcohol or use illicit drugs.  No Known Allergies  Family History  Problem Relation Age of Onset  . Hypertension Other     Prior to Admission medications   Medication Sig Start Date End Date Taking? Authorizing Provider  albuterol (PROVENTIL) (2.5 MG/3ML) 0.083% nebulizer solution Take 3 mLs (2.5 mg total) by nebulization every 4 (four) hours as needed for wheezing or shortness of breath. 04/02/15  Yes Shanker Kristeen Mans, MD  ALPRAZolam Duanne Moron) 0.25 MG tablet Take 0.5-1 tablets by mouth daily as needed for anxiety.  09/11/15  Yes Historical Provider, MD  amLODipine (NORVASC) 10 MG tablet Take 10 mg by mouth daily.   Yes Historical Provider, MD  aspirin 81 MG tablet Take 81 mg by mouth daily.   Yes Historical Provider, MD  atorvastatin (LIPITOR) 10 MG tablet Take 10 mg by mouth daily.   Yes Historical Provider, MD  furosemide (LASIX) 40 MG tablet Take 0.5 tablets (20 mg total) by mouth daily. 05/31/16  Yes Costin Karlyne Greenspan, MD  hydrALAZINE (APRESOLINE) 100 MG tablet Take 50-100 mg by mouth 2 (two) times daily. Take 100 mg every morning and 50 mg every night. 06/14/15  Yes Historical Provider, MD  ipratropium-albuterol (DUONEB) 0.5-2.5 (3) MG/3ML SOLN Take 3 mLs by nebulization 2 (two) times daily. 04/05/15  Yes Shanker Kristeen Mans, MD  labetalol (NORMODYNE) 300 MG tablet Take 1 tablet (300 mg total) by  mouth 2 (two) times daily. 05/09/14  Yes Marianne L York, PA-C  levofloxacin (LEVAQUIN) 500 MG tablet Take 1 tablet (500 mg total) by mouth daily. 05/31/16  Yes Costin Karlyne Greenspan, MD  benzonatate (TESSALON) 100 MG capsule Take 1 capsule (100 mg total) by mouth every 8 (eight) hours. Patient not taking: Reported on 06/04/2016 04/18/16   Frederica Kuster, PA-C  cetirizine (ZYRTEC ALLERGY) 10 MG tablet Take 1 tablet (10 mg total) by mouth daily. Patient  not taking: Reported on 06/04/2016 04/18/16   Bea Graff Law, PA-C  docusate sodium (COLACE) 100 MG capsule Take 1 capsule (100 mg total) by mouth every 12 (twelve) hours. Patient not taking: Reported on 06/04/2016 06/19/15   Dorie Rank, MD  Maltodextrin-Xanthan Gum ([place]) POWD With every meal 11/12/15   Hosie Poisson, MD  pramoxine (PROCTOFOAM) 1 % foam Place 1 application rectally 3 (three) times daily as needed for itching. 06/19/15   Dorie Rank, MD    Physical Exam: Filed Vitals:   06/04/16 2013 06/04/16 2030 06/04/16 2129 06/04/16 2133  BP: 133/88 133/82 129/81   Pulse: 63 83 62 65  Temp:      TempSrc:      Resp: 16 20 18 24   Height:      Weight:      SpO2: 100% 93% 98% 97%      Constitutional: Not in distress. Appears cachectic. Filed Vitals:   06/04/16 2013 06/04/16 2030 06/04/16 2129 06/04/16 2133  BP: 133/88 133/82 129/81   Pulse: 63 83 62 65  Temp:      TempSrc:      Resp: 16 20 18 24   Height:      Weight:      SpO2: 100% 93% 98% 97%   Eyes: Anicteric no pallor. PERRLA positive. ENMT: No discharge from the ears eyes nose and mouth. Neck: No mass felt. No neck rigidity. Respiratory: No rhonchi or crepitations. Cardiovascular: S1 and S2 heard. Abdomen: Soft nontender bowel sounds present. Musculoskeletal: No edema. Skin: No rash. Neurologic: Patient at this time is become more alert and following commands. Moves all extremities. At baseline patient has difficulty speaking from previous stroke. Psychiatric: Mildly lethargic.   Labs on Admission: I have personally reviewed following labs and imaging studies  CBC:  Recent Labs Lab 05/29/16 1007 05/30/16 0556 05/31/16 0611 06/04/16 1902  WBC 6.6 6.7 7.9 12.5*  NEUTROABS 5.6  --   --  11.1*  HGB 10.7* 9.6* 10.1* 9.9*  HCT 34.9* 32.0* 33.7* 30.8*  MCV 86.6 87.2 88.0 84.8  PLT 145* 128* 135* [id number]*   Basic Metabolic Panel:  Recent Labs Lab 05/29/16 1007 05/30/16 0556 05/31/16 0611  06/04/16 1902  NA 151* 153* 155* 162*  K 3.2* 3.0* 3.4* 3.3*  CL 111 115* 116* 127*  CO2 31 30 30 22   GLUCOSE 129* 96 123* 92  BUN 43* 36* 38* 100*  CREATININE 1.02 0.79 0.88 4.37*  CALCIUM 11.0* 10.7* 11.1* 10.2   GFR: Estimated Creatinine Clearance: 8.2 mL/min (by C-G formula based on Cr of 4.37). Liver Function Tests:  Recent Labs Lab 06/04/16 1902  AST 47*  ALT 30  ALKPHOS 64  BILITOT 1.1  PROT 6.1*  ALBUMIN 2.5*   No results for input(s): LIPASE, AMYLASE in the last 168 hours. No results for input(s): AMMONIA in the last 168 hours. Coagulation Profile: No results for input(s): INR, PROTIME in the last 168 hours. Cardiac Enzymes: No results for input(s): CKTOTAL, CKMB, CKMBINDEX, TROPONINI in  the last 168 hours. BNP (last 3 results) No results for input(s): PROBNP in the last 8760 hours. HbA1C: No results for input(s): HGBA1C in the last 72 hours. CBG: No results for input(s): GLUCAP in the last 168 hours. Lipid Profile: No results for input(s): CHOL, HDL, LDLCALC, TRIG, CHOLHDL, LDLDIRECT in the last 72 hours. Thyroid Function Tests: No results for input(s): TSH, T4TOTAL, FREET4, T3FREE, THYROIDAB in the last 72 hours. Anemia Panel: No results for input(s): VITAMINB12, FOLATE, FERRITIN, TIBC, IRON, RETICCTPCT in the last 72 hours. Urine analysis:    Component Value Date/Time   COLORURINE AMBER* 06/04/2016 2041   APPEARANCEUR TURBID* 06/04/2016 2041   LABSPEC 1.023 06/04/2016 2041   PHURINE 5.5 06/04/2016 2041   GLUCOSEU NEGATIVE 06/04/2016 2041   HGBUR LARGE* 06/04/2016 2041   BILIRUBINUR SMALL* 06/04/2016 2041   KETONESUR NEGATIVE 06/04/2016 2041   PROTEINUR >300* 06/04/2016 2041   UROBILINOGEN 0.2 11/07/2015 1221   NITRITE NEGATIVE 06/04/2016 2041   LEUKOCYTESUR MODERATE* 06/04/2016 2041   Sepsis Labs: @LABRCNTIP (procalcitonin:4,lacticidven:4) ) Recent Results (from the past 240 hour(s))  Blood culture (routine x 2)     Status: None   Collection  Time: 05/29/16 10:24 AM  Result Value Ref Range Status   Specimen Description BLOOD RIGHT FOREARM  Final   Special Requests BOTTLES DRAWN AEROBIC AND ANAEROBIC  10CC  Final   Culture NO GROWTH 5 DAYS  Final   Report Status 06/03/2016 FINAL  Final  Blood culture (routine x 2)     Status: None   Collection Time: 05/29/16 10:30 AM  Result Value Ref Range Status   Specimen Description BLOOD RIGHT HAND  Final   Special Requests BOTTLES DRAWN AEROBIC AND ANAEROBIC  10CC  Final   Culture NO GROWTH 5 DAYS  Final   Report Status 06/03/2016 FINAL  Final  Urine culture     Status: Abnormal   Collection Time: 05/30/16  3:17 PM  Result Value Ref Range Status   Specimen Description URINE, CLEAN CATCH  Final   Special Requests NONE  Final   Culture <10,000 COLONIES/mL INSIGNIFICANT GROWTH (A)  Final   Report Status 05/31/2016 FINAL  Final     Radiological Exams on Admission: Dg Chest Port 1 View  06/04/2016  CLINICAL DATA:  Increasing shortness of Breath EXAM: PORTABLE CHEST 1 VIEW COMPARISON:  05/30/16 FINDINGS: Cardiac shadow is stable. The lungs are well aerated bilaterally. Persistent left basilar infiltrate is seen and stable from the prior study. No focal infiltrate is seen. No bony abnormality is noted. IMPRESSION: Stable left basilar infiltrate. Electronically Signed   By: Inez Catalina M.D.   On: 06/04/2016 19:50    EKG: Independently reviewed. Normal sinus rhythm with PVCs.  Assessment/Plan Principal Problem:   ARF (acute renal failure) (HCC) Active Problems:   Hypertension   Severe aortic stenosis   Aspiration pneumonia (HCC)   Hypernatremia   Acute respiratory failure with hypoxia ([place])    #1. Acute renal failure with severe hypernatremia - probably from poor oral intake and dehydration. In addition patient has been using Lasix despite poor oral intake. At this time patient has received fluid boluses patient was having hypotension in the ER. SinceAt this time is improved I have  placed patient on D5W we'll closely follow metabolic panel for checking sodium and creatinine trends. Closely follow intake output to make sure patient is making urine. Will discontinue Lasix for now but patient does have his chart of CHF so closely for respiratory status for any worsening.  Check FENa. #2. Acute encephalopathy secondary to multiple causes including metabolic derangements and respiratory status. We'll also check ammonia levels. #3. Acute respiratory failure with hypoxia - probably a combination of pneumonia and CHF with severe aortic stenosis. At this time patient has been placed on BiPAP. Difficult situation because patient will need IV fluids for dehydration and renal failure. For pneumonia patient has been placed on vancomycin and cefepime. I think patient may be having aspiration component for which I have requested speech therapist evaluation. #4. History of diastolic CHF with severe aortic stenosis - recent cardiology note states the patient is not felt to be a candidate for any procedure for the aortic stenosis given patient's poor functional status. Patient is on Lasix but it's on hold due to #1. #5. Hypertension - since patient is nothing by mouth I have placed patient on when necessary IV hydralazine for now. #6. Chronic anemia and thrombocytopenia - follow CBC. #7. Cachexia with severe protein calorie malnutrition. #8. History of stroke with difficulty speaking.  I have consulted palliative care for goals of care.   DVT prophylaxis: Heparin. Code Status: DO NOT INTUBATE.  Family Communication: Patient's daughter and wife.  Disposition Plan: To be determined.  Consults called: Palliative care.  Admission status: Inpatient. Stepdown. Likely stay 2-3 days.    Rise Patience MD [facility] Pager [phone].  If 7PM-7AM, please contact night-coverage [email or web address] Password [email or web address]  06/04/2016, 10:01 PM

## 2016-06-05 ENCOUNTER — Inpatient Hospital Stay (HOSPITAL_COMMUNITY): Payer: Medicare Other

## 2016-06-05 DIAGNOSIS — N179 Acute kidney failure, unspecified: Secondary | ICD-10-CM

## 2016-06-05 DIAGNOSIS — N390 Urinary tract infection, site not specified: Secondary | ICD-10-CM

## 2016-06-05 DIAGNOSIS — I350 Nonrheumatic aortic (valve) stenosis: Secondary | ICD-10-CM

## 2016-06-05 DIAGNOSIS — J690 Pneumonitis due to inhalation of food and vomit: Secondary | ICD-10-CM

## 2016-06-05 DIAGNOSIS — Z8673 Personal history of transient ischemic attack (TIA), and cerebral infarction without residual deficits: Secondary | ICD-10-CM

## 2016-06-05 DIAGNOSIS — E785 Hyperlipidemia, unspecified: Secondary | ICD-10-CM

## 2016-06-05 DIAGNOSIS — I10 Essential (primary) hypertension: Secondary | ICD-10-CM

## 2016-06-05 DIAGNOSIS — N183 Chronic kidney disease, stage 3 unspecified: Secondary | ICD-10-CM | POA: Diagnosis present

## 2016-06-05 DIAGNOSIS — N170 Acute kidney failure with tubular necrosis: Secondary | ICD-10-CM

## 2016-06-05 DIAGNOSIS — J9601 Acute respiratory failure with hypoxia: Secondary | ICD-10-CM

## 2016-06-05 DIAGNOSIS — R7989 Other specified abnormal findings of blood chemistry: Secondary | ICD-10-CM

## 2016-06-05 DIAGNOSIS — L8990 Pressure ulcer of unspecified site, unspecified stage: Secondary | ICD-10-CM

## 2016-06-05 DIAGNOSIS — N189 Chronic kidney disease, unspecified: Secondary | ICD-10-CM

## 2016-06-05 LAB — BASIC METABOLIC PANEL
ANION GAP: 13 (ref 5–15)
ANION GAP: 14 (ref 5–15)
ANION GAP: 11 (ref 5–15)
ANION GAP: 10 (ref 5–15)
ANION GAP: 12 (ref 5–15)
BUN: 104 mg/dL — ABNORMAL HIGH (ref 6–20)
BUN: 108 mg/dL — AB (ref 6–20)
BUN: 103 mg/dL — ABNORMAL HIGH (ref 6–20)
BUN: 105 mg/dL — ABNORMAL HIGH (ref 6–20)
BUN: 105 mg/dL — AB (ref 6–20)
CALCIUM: 10.4 mg/dL — AB (ref 8.9–10.3)
CALCIUM: 10.3 mg/dL (ref 8.9–10.3)
CALCIUM: 10.3 mg/dL (ref 8.9–10.3)
CALCIUM: 10.2 mg/dL (ref 8.9–10.3)
CHLORIDE: 124 mmol/L — AB (ref 101–111)
CHLORIDE: 124 mmol/L — AB (ref 101–111)
CO2: 24 mmol/L (ref 22–32)
CO2: 20 mmol/L — AB (ref 22–32)
CO2: 24 mmol/L (ref 22–32)
CO2: 23 mmol/L (ref 22–32)
CO2: 20 mmol/L — ABNORMAL LOW (ref 22–32)
Calcium: 10.2 mg/dL (ref 8.9–10.3)
Chloride: 124 mmol/L — ABNORMAL HIGH (ref 101–111)
Chloride: 123 mmol/L — ABNORMAL HIGH (ref 101–111)
Chloride: 123 mmol/L — ABNORMAL HIGH (ref 101–111)
Creatinine, Ser: 4.38 mg/dL — ABNORMAL HIGH (ref 0.61–1.24)
Creatinine, Ser: 4.26 mg/dL — ABNORMAL HIGH (ref 0.61–1.24)
Creatinine, Ser: 4.35 mg/dL — ABNORMAL HIGH (ref 0.61–1.24)
Creatinine, Ser: 4.13 mg/dL — ABNORMAL HIGH (ref 0.61–1.24)
Creatinine, Ser: 4.05 mg/dL — ABNORMAL HIGH (ref 0.61–1.24)
GFR calc Af Amer: 14 mL/min — ABNORMAL LOW (ref >60)
GFR calc Af Amer: 15 mL/min — ABNORMAL LOW (ref >60)
GFR calc Af Amer: 15 mL/min — ABNORMAL LOW (ref >60)
GFR calc non Af Amer: 12 mL/min — ABNORMAL LOW (ref >60)
GFR calc non Af Amer: 12 mL/min — ABNORMAL LOW (ref >60)
GFR, EST AFRICAN AMERICAN: 14 mL/min — AB (ref >60)
GFR, EST AFRICAN AMERICAN: 14 mL/min — AB (ref >60)
GFR, EST NON AFRICAN AMERICAN: 12 mL/min — AB (ref >60)
GFR, EST NON AFRICAN AMERICAN: 13 mL/min — AB (ref >60)
GFR, EST NON AFRICAN AMERICAN: 13 mL/min — AB (ref >60)
GLUCOSE: 142 mg/dL — AB (ref 65–99)
GLUCOSE: 129 mg/dL — AB (ref 65–99)
GLUCOSE: 132 mg/dL — AB (ref 65–99)
GLUCOSE: 129 mg/dL — AB (ref 65–99)
Glucose, Bld: 132 mg/dL — ABNORMAL HIGH (ref 65–99)
POTASSIUM: 3.2 mmol/L — AB (ref 3.5–5.1)
POTASSIUM: 3.4 mmol/L — AB (ref 3.5–5.1)
POTASSIUM: 3.2 mmol/L — AB (ref 3.5–5.1)
POTASSIUM: 3.2 mmol/L — AB (ref 3.5–5.1)
POTASSIUM: 3.8 mmol/L (ref 3.5–5.1)
SODIUM: 159 mmol/L — AB (ref 135–145)
SODIUM: 156 mmol/L — AB (ref 135–145)
SODIUM: 155 mmol/L — AB (ref 135–145)
Sodium: 161 mmol/L (ref 135–145)
Sodium: 158 mmol/L — ABNORMAL HIGH (ref 135–145)

## 2016-06-05 LAB — GLUCOSE, CAPILLARY
GLUCOSE-CAPILLARY: 121 mg/dL — AB (ref 65–99)
GLUCOSE-CAPILLARY: 123 mg/dL — AB (ref 65–99)
GLUCOSE-CAPILLARY: 124 mg/dL — AB (ref 65–99)
GLUCOSE-CAPILLARY: 125 mg/dL — AB (ref 65–99)
GLUCOSE-CAPILLARY: 130 mg/dL — AB (ref 65–99)
Glucose-Capillary: 122 mg/dL — ABNORMAL HIGH (ref 65–99)

## 2016-06-05 LAB — CBC
HEMATOCRIT: 33.4 % — AB (ref 39.0–52.0)
HEMOGLOBIN: 10.5 g/dL — AB (ref 13.0–17.0)
MCH: 27.7 pg (ref 26.0–34.0)
MCHC: 31.4 g/dL (ref 30.0–36.0)
MCV: 88.1 fL (ref 78.0–100.0)
Platelets: 113 10*3/uL — ABNORMAL LOW (ref 150–400)
RBC: 3.79 MIL/uL — AB (ref 4.22–5.81)
RDW: 14.7 % (ref 11.5–15.5)
WBC: 11.7 10*3/uL — ABNORMAL HIGH (ref 4.0–10.5)

## 2016-06-05 LAB — MAGNESIUM
MAGNESIUM: 2.1 mg/dL (ref 1.7–2.4)
Magnesium: 2.2 mg/dL (ref 1.7–2.4)

## 2016-06-05 LAB — TROPONIN I: TROPONIN I: 0.38 ng/mL — AB (ref <0.031)

## 2016-06-05 LAB — SODIUM, URINE, RANDOM: SODIUM UR: 48 mmol/L

## 2016-06-05 LAB — LACTIC ACID, PLASMA: Lactic Acid, Venous: 1.6 mmol/L (ref 0.5–2.0)

## 2016-06-05 LAB — MRSA PCR SCREENING: MRSA BY PCR: NEGATIVE

## 2016-06-05 LAB — T4, FREE: FREE T4: 2.65 ng/dL — AB (ref 0.61–1.12)

## 2016-06-05 LAB — CREATININE, URINE, RANDOM: Creatinine, Urine: 101.03 mg/dL

## 2016-06-05 LAB — TSH: TSH: <0.01 u[IU]/mL — ABNORMAL LOW (ref 0.350–4.500)

## 2016-06-05 LAB — AMMONIA: Ammonia: 18 umol/L (ref 9–35)

## 2016-06-05 LAB — LACTATE DEHYDROGENASE: LDH: 228 U/L — AB (ref 98–192)

## 2016-06-05 MED ORDER — POTASSIUM CHLORIDE 10 MEQ/100ML IV SOLN
10.0000 meq | INTRAVENOUS | Status: AC
  Administered 2016-06-05 (×3): 10 meq via INTRAVENOUS
  Filled 2016-06-05 (×3): qty 100

## 2016-06-05 MED ORDER — ASPIRIN 81 MG PO CHEW
81.0000 mg | CHEWABLE_TABLET | Freq: Every day | ORAL | Status: DC
  Administered 2016-06-07 – 2016-06-10 (×4): 81 mg via ORAL
  Filled 2016-06-05 (×7): qty 1

## 2016-06-05 MED ORDER — METHIMAZOLE 5 MG PO TABS
5.0000 mg | ORAL_TABLET | Freq: Every day | ORAL | Status: DC
  Administered 2016-06-05 – 2016-06-10 (×6): 5 mg via ORAL
  Filled 2016-06-05 (×6): qty 1

## 2016-06-05 MED ORDER — ALPRAZOLAM 0.25 MG PO TABS: 0.1250 mg | ORAL_TABLET | Freq: Two times a day (BID) | ORAL | Status: DC | PRN

## 2016-06-05 MED ORDER — DEXTROSE-NACL 5-0.45 % IV SOLN
INTRAVENOUS | Status: DC
  Administered 2016-06-05 – 2016-06-09 (×7): via INTRAVENOUS

## 2016-06-05 NOTE — Care Management Note (Signed)
Case Management Note  Patient Details  Name: Bradley [name] MRN: [id number] Date of Birth: April 18, 1941  Subjective/Objective:           pna         Action/Plan:Date:  June 05, 2016 Chart reviewed for concurrent status and case management needs. Will continue to follow the patient for changes and needs: Expected discharge date: [id number] Velva Harman, BSN, [place], [place]   Expected Discharge Date:   (unknown)               Expected Discharge Plan:  Home/Self Care  In-House Referral:  NA  Discharge planning Services  CM Consult  Post Acute Care Choice:  NA Choice offered to:  NA  DME Arranged:    DME Agency:     HH Arranged:    [place] Agency:     Status of Service:  In process, will continue to follow  Medicare Important Message Given:    Date Medicare IM Given:    Medicare IM give by:    Date Additional Medicare IM Given:    Additional Medicare Important Message give by:     If discussed at [place] of Stay Meetings, dates discussed:    Additional Comments:  Leeroy Cha, RN 06/05/2016, 9:50 AM

## 2016-06-05 NOTE — Consult Note (Signed)
Reason for Consult: Acute renal failure, hypernatremia Referring Physician: Berle Mull M.D. [employer] [facility] [employer])  HPI: (history obtained from chart and daughter who is at bedside) 75 year old man with past medical history significant for baseline creatinine of 0.8-1.0 (CK D stage II-III) who follows up semiannually with Dr. Mercy Moore for chronic kidney disease, hypertension, prior history of CVA, aortic stenosis and CHF (EF unknown but reported as "low" by daughter). Brought to the emergency room with lethargy/decreased responsiveness since recent discharge from the hospital  (for left lower lobe pneumonia) when he has become progressively weaker with decreased oral intake except for medications. Was noted to be hypotensive on admission and given fluid bolus, because he also had hypoxia, he was put on BiPAP-currently on oxygen via nasal cannula.  He denies any chest pain and preceding fever or chills. He apparently has had significant weight loss and wasting for the past few months. No recent nausea, vomiting or diarrhea. No recent hematuria, flank pain, dysuria or incontinence. He was discharged on doxycycline and denies any NSAIDs/iodinated intravenous contrast use.   04/18/2016  05/29/2016  05/30/2016  05/31/2016  06/04/2016  06/05/2016   BUN 21 (H) 43 (H) 36 (H) 38 (H) 100 (H) 103 (H)  Creatinine 0.86 1.02 0.79 0.88 4.37 (H) 4.35 (H)    Past Medical History  Diagnosis Date  . Stroke ([place])   . Hypertension   . Hyperlipidemia   . Aortic stenosis   . CHF (congestive heart failure) ([place])   . MGUS (monoclonal gammopathy of unknown significance)   . Goiter   . Rhabdomyolysis   . UTI (urinary tract infection) 05/09/2014  . Dementia   . Pneumonia     Past Surgical History  Procedure Laterality Date  . Circumcision      Family History  Problem Relation Age of Onset  . Hypertension Other     Social History:  reports that he has never smoked. He has never used smokeless tobacco. He reports that he does not  drink alcohol or use illicit drugs.  Allergies: No Known Allergies  Medications:  Scheduled: . antiseptic oral rinse  7 mL Mouth Rinse q12n4p  . aspirin  81 mg Oral Daily  . ceFEPime (MAXIPIME) IV  500 mg Intravenous Q24H  . chlorhexidine  15 mL Mouth Rinse BID  . heparin  5,000 Units Subcutaneous Q8H  . ipratropium-albuterol  3 mL Nebulization BID  . methimazole  5 mg Oral Daily  . potassium chloride  10 mEq Intravenous Q1 Hr x 3    BMP Latest Ref Rng 06/05/2016 06/05/2016 06/05/2016  Glucose 65 - 99 mg/dL 132(H) 129(H) 142(H)  BUN 6 - 20 mg/dL 103(H) 108(H) 104(H)  Creatinine 0.61 - 1.24 mg/dL 4.35(H) 4.26(H) 4.38(H)  Sodium 135 - 145 mmol/L 159(H) 158(H) 161(HH)  Potassium 3.5 - 5.1 mmol/L 3.2(L) 3.4(L) 3.2(L)  Chloride 101 - 111 mmol/L 124(H) 124(H) 124(H)  CO2 22 - 32 mmol/L 24 20(L) 24  Calcium 8.9 - 10.3 mg/dL 10.3 10.2 10.4(H)   CBC Latest Ref Rng 06/05/2016 06/04/2016 05/31/2016  WBC 4.0 - 10.5 K/uL 11.7(H) 12.5(H) 7.9  Hemoglobin 13.0 - 17.0 g/dL 10.5(L) 9.9(L) 10.1(L)  Hematocrit 39.0 - 52.0 % 33.4(L) 30.8(L) 33.7(L)  Platelets 150 - 400 K/uL 113(L) 120(L) 135(L)     US Renal  06/05/2016  CLINICAL DATA:  Acute kidney injury. EXAM: RENAL / URINARY TRACT ULTRASOUND COMPLETE COMPARISON:  02/28/2011 FINDINGS: Right Kidney: Length: 10.7 cm. Increased parenchymal echogenicity. No hydronephrosis. Two cyst within the mid pole are noted. The  larger measures 1.6 x 1.3 x 1.0 cm. Left Kidney: Length: 10.6 cm. Increased parenchymal echogenicity. No hydronephrosis. There are several cysts which appear mildly complex containing low level internal echoes. The largest measures 1.2 x 0.8 x 1.1 cm. Bladder: Partially collapsed around a Foley catheter balloon. Debris identified within the urinary bladder. IMPRESSION: 1. No hydronephrosis. 2. Bilateral echogenic kidneys compatible with chronic medical renal disease. 3. Mildly complex cyst within the left ovary containing internal low level echoes.  Consider further evaluation with nonemergent MRI of the kidneys. Electronically Signed   By: Kerby Moors M.D.   On: 06/05/2016 10:16   Dg Chest Port 1 View  06/04/2016  CLINICAL DATA:  Increasing shortness of Breath EXAM: PORTABLE CHEST 1 VIEW COMPARISON:  05/30/16 FINDINGS: Cardiac shadow is stable. The lungs are well aerated bilaterally. Persistent left basilar infiltrate is seen and stable from the prior study. No focal infiltrate is seen. No bony abnormality is noted. IMPRESSION: Stable left basilar infiltrate. Electronically Signed   By: Inez Catalina M.D.   On: 06/04/2016 19:50    Review of Systems  Constitutional: Positive for weight loss and malaise/fatigue. Negative for fever and chills.  Respiratory: Positive for shortness of breath. Negative for cough and sputum production.        Some transient shortness of breath reported yesterday  Gastrointestinal: Negative.   Genitourinary: Negative.   Musculoskeletal: Negative.   Neurological: Positive for weakness.   Blood pressure 128/71, pulse 67, temperature 96.6 F (35.9 C), temperature source Axillary, resp. rate 33, height 5\' 7"  (1.702 m), weight 41.5 kg (91 lb 7.9 oz), SpO2 99 %. Physical Exam  Nursing note and vitals reviewed. Constitutional:  Frail and cachectic appearing man  HENT:  Head: Normocephalic and atraumatic.  Nose: Nose normal.  Dry mucosal membranes  Eyes: Pupils are equal, round, and reactive to light. No scleral icterus.  Neck: No tracheal deviation present. Thyromegaly present.  Large goiter-predominantly left neck with palpable adenopathy anterior cervical chain bilaterally  Cardiovascular: Normal rate, regular rhythm and normal heart sounds.   No murmur heard. Respiratory: Effort normal and breath sounds normal.  GI: Soft. Bowel sounds are normal.  Scaphoid abdomen with visible peristalsis  Musculoskeletal: He exhibits no edema.  Neurological: He is alert.  Skin: Skin is warm and dry.     Assessment/Plan: 1. Acute renal failure on chronic kidney disease stage II: Currently appears to be oliguric. Appears to be hemodynamically mediated with likely evolution of prerenal azotemia to ischemic ATN given relative hypotension noted upon presentation to the emergency room. Urinalysis results reviewed with multiple RBCs-this will be repeated again to verify for possible Foley trauma. Fractional excretion of sodium is 1.3% but likely confounded by the fact that he was taking furosemide. Given physical exam findings, agree with intravenous fluid therapy-will switch this from dextrose to D5 half-normal saline to provide him with free water as well as volume-will need to be cautious with history of CHF. Do not suspect that this is an acute GN. He is oliguric and hopefully should turn around-he is not in a physical state to tolerate hemodialysis should his renal function not improve. 2. Hyponatremia: Secondary to dehydration/free water deficit-this is calculated at about 6-7 L. Will replete cautiously in order to prevent cerebral edema. 3. Hypokalemia: Secondary to diuretic-induced losses as well as limited oral intake, will replace cautiously intravenously. He would benefit from an NG tube placement for enteral feeds/water/medications 4. Deconditioning/failure to thrive: With significant wasting/cachexia over the past few weeks/months. 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 Unfortunately, family members offended by palliative care consult. 5. Suspected sepsis with urinary tract infection/HCAP: On broad-spectrum antibiotic therapy with Maxipime and vancomycin, continue to monitor levels   Carlee Vonderhaar K. 06/05/2016, 2:44 PM

## 2016-06-05 NOTE — Evaluation (Signed)
Clinical/Bedside Swallow Evaluation Patient Details  Name: Bradley [name] MRN: [id number] Date of Birth: 1941/02/22  Today's Date: 06/05/2016 Time: SLP Start Time (ACUTE ONLY): 62 SLP Stop Time (ACUTE ONLY): 0940 SLP Time Calculation (min) (ACUTE ONLY): 25 min  Past Medical History:  Past Medical History  Diagnosis Date  . Stroke ([place])   . Hypertension   . Hyperlipidemia   . Aortic stenosis   . CHF (congestive heart failure) ([place])   . MGUS (monoclonal gammopathy of unknown significance)   . Goiter   . Rhabdomyolysis   . UTI (urinary tract infection) 05/09/2014  . Dementia   . Pneumonia    Past Surgical History:  Past Surgical History  Procedure Laterality Date  . Circumcision     HPI:  75 y.o. male with medical history significant of recent admitted for pneumonia was brought to the ER after patient became lethargic and decreased response. Patient's family states that since discharge patient has become progressively weak and over the last 2 days has hardly eaten anything but did take his medications yesterday. Today patient became very lethargic with hardly any response and patient was brought to the ER. Initially patient was mildly hypotensive and was given fluid bolus. Labs showing acute renal failure with creatinine around 4 with normal creatinine just 4 days ago. Patient's sodium is also around 161. After admission patient is becoming more responsive and following commands. Patient is also in respiratory failure requiring BiPAP. Chest x-ray shows stable infiltrates. Patient's family at this time as requested no intubation. Patient has history of stroke with difficulty speaking. As per the family patient has become more bedbound recently. Previously used to use a walker. Patient also has severe aortic stenosis and cardiology has felt patient is not a candidate for intervention due to poor functional capacity; pt lethargic initially, but awake/alert for BSE this date.  Daughter provided  information re: PO intake at home and strategies/diet previously tolerating at home.   Assessment / Plan / Recommendation Clinical Impression   Pt exhibited moderate oropharyngeal dysphagia with oral holding, prolonged oral propulsion/manipulation and multiple swallows with intake of puree/honey-thickened liquids via teaspoon.  Pt also exhibited oral spillage with portion of PO intake, which is concerning paired with dysphagia hx/limited diet for nutritive/hydration purposes as pt has limited intake of PO's and may need non-oral nutrition options to maintain appropriate nutrition/hydration at this time. ST to f/u 1-2x to ensure appropriate diet tolerance and POC. Discussed strategies/diet recommendation and possibility of non-oral nutrition with daughter/pt during BSE as well.    Aspiration Risk  Moderate aspiration risk    Diet Recommendation  Dysphagia 1 (puree)/honey-thickened liquids via tsp   Medication Administration: Crushed with puree    Other  Recommendations Oral Care Recommendations: Oral care BID Other Recommendations: Order thickener from pharmacy   Follow up Recommendations  24 hour supervision/assistance    Frequency and Duration min 2x/week  1 week       Prognosis Prognosis for Safe Diet Advancement: Fair Barriers to Reach Goals: Severity of deficits      Swallow Study   General Date of Onset: 06/04/16 HPI: 75 y.o. male with medical history significant of recent admitted for pneumonia was brought to the ER after patient became lethargic and decreased response. Patient's family states that since discharge patient has become progressively weak and over the last 2 days has hardly eaten anything but did take his medications yesterday. Today patient became very lethargic with hardly any response and patient was brought to the  ER. Initially patient was mildly hypotensive and was given fluid bolus. Labs showing acute renal failure with creatinine around 4 with normal creatinine  just 4 days ago. Patient's sodium is also around 161. After admission patient is becoming more responsive and following commands. Patient is also in respiratory failure requiring BiPAP. Chest x-ray shows stable infiltrates. Patient's family at this time as requested no intubation. Patient has history of stroke with difficulty speaking. As per the family patient has become more bedbound recently. Previously used to use a walker. Patient also has severe aortic stenosis and cardiology has felt patient is not a candidate for intervention due to poor functional capacity. Type of Study: Bedside Swallow Evaluation Previous Swallow Assessment: 05/30/16 Rec'd HTL/D1 (puree) Diet Prior to this Study: NPO Temperature Spikes Noted: No Respiratory Status: Nasal cannula History of Recent Intubation: No Behavior/Cognition: Alert;Cooperative Oral Cavity Assessment: Within Functional Limits Oral Care Completed by SLP: Recent completion by staff Oral Cavity - Dentition: Adequate natural dentition Vision: Impaired for self-feeding Self-Feeding Abilities: Needs assist;Needs set up Patient Positioning: Upright in bed Baseline Vocal Quality: Low vocal intensity Volitional Cough: Strong Volitional Swallow: Unable to elicit    Oral/Motor/Sensory Function Overall Oral Motor/Sensory Function: Generalized oral weakness Lingual Strength: Reduced   Ice Chips Ice chips: Not tested   Thin Liquid Thin Liquid: Not tested    Nectar Thick Nectar Thick Liquid: Not tested   Honey Thick Honey Thick Liquid: Impaired Presentation: Spoon Oral Phase Impairments: Reduced lingual movement/coordination Oral Phase Functional Implications: Oral residue;Oral holding Pharyngeal Phase Impairments: Suspected delayed Swallow;Multiple swallows   Puree Puree: Impaired Presentation: Spoon Oral Phase Impairments: Reduced labial seal;Reduced lingual movement/coordination Oral Phase Functional Implications: Prolonged oral transit;Oral  residue;Oral holding Pharyngeal Phase Impairments: Suspected delayed Swallow;Multiple swallows   Solid      Solid: Not tested        Lakeesha Fontanilla,PAT, M.S,CCC-SLP 06/05/2016,10:27 AM

## 2016-06-05 NOTE — Progress Notes (Signed)
I stopped by to check in on Bradley [name].    His daughter was present in the room.  She and Dr. Posey Pronto were able to talk this morning and plan is to continue with current therapy and follow his clinical course.  She is not interested in meeting with our team at this time.  I provided her with my card and let her know that we remain available.  She will call if she thinks that our team can be of assistance to her family during this admission.  No plan to follow-up further unless called.  Please call if we can be of further assistance in the care of Bradley [name].  We will not round on him again until called by family or primary team.  Micheline Rough, MD [place] [facility] [phone]

## 2016-06-05 NOTE — Progress Notes (Signed)
CRITICAL VALUE ALERT  Critical value received: Na 161  Date of notification:  06/05/2016  Time of notification:  0200  Critical value read back:Yes.    Nurse who received alert:  MSchramm  MD notified (1st page):  K.Schorr  Time of first page:  0215

## 2016-06-05 NOTE — Progress Notes (Signed)
[facility] Progress Note  Patient: Bradley [name] [id number]   PCP: [name], MD DOB: December 15, 1941   DOA: 06/04/2016   DOS: 06/05/2016   Date of Service: the patient was seen and examined on 06/05/2016  Subjective: The patient denies having acute complaints of shortness of breath or chest pain or abdominal pain. No dizziness. Still remains disoriented. Nutrition: Remains nothing by mouth  Brief hospital course: Patient was admitted on 06/04/2016, with complaint of unresponsive episode, was found to have Acute kidney injury with hypernatremia as well as acute hypoxic respiratory failure requiring BiPAP. Currently further plan is continue monitoring for renal function.  Assessment and Plan: 1. Acute renal failure superimposed on stage 3 chronic kidney disease (HCC)  hypernatremia.  Patient presents with serum creatinine of 4.3 as well as sodium of 161. Likely prerenal since the patient has been having poor oral intake for last 3 days with continued use of Lasix. Improving with IV hydration. Urine output is not improving. Check renal ultrasound. Foley catheter. Nephrology consulted. We'll continue to monitor. Serum BMP every 4 hours.  2.  suspected sepsis with UTI as well as healthcare associated pneumonia  Presented with tachycardia, lactic acidosis, acute respiratory failure with requiring BiPAP. Continuing IV antibiotics at present. Monitor for culture. Appears to have UTI but no evidence of pneumonia.  3.abnormal TSH. Thyromegaly. 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 TSH is significantly low. Recheck free T4. If low family wants to treat the patient for treatable condition will start the patient on methimazole.  4.pressure ulcer. Protein calorie malnutrition likely severe. Nutritional consult. Speech consult for swallowing recommends dysphagia type I diet. Continue frequent turning and repositioning.  5.Cosopt care discussion. Next and severe aortic  stenosis. CVA. Chronic dysphagia. Discussed with patient's daughter who is the power of attorney regarding goals of care. She had a discussion with her father that he does not want to be artificially kept alive. On admission the patient was DO NOT INTUBATE. I explained, Regarding goals of resuscitation with CPR. After understanding the daughter requested the patient to be DNR/DNI since she mentions "if he is dead he is dead". I will give the patient and patient's daughter a copy of MOST form to review.  6. History of CVA. Resume aspirin.  Pain management: When necessary Tylenol  Activity: consulted physical therapy Bowel regimen: last BM prior to admission Diet: dysphagia type I diet DVT Prophylaxis: subcutaneous Heparin  Advance goals of care discussion: Please see above. As per my discussion patient is transitioned to DNR/DNI.   Family Communication: family was present at bedside, at the time of interview. The pt provided permission to discuss medical plan with the family. Opportunity was given to ask question and all questions were answered satisfactorily.   Disposition:  Discharge to SNF versus home with home health, Expected discharge date: 06/07/2016  Consultants: Palliative care, nephrology  Procedures: ultrasound renal pending   Antibiotics: Anti-infectives    Start     Dose/Rate Route Frequency Ordered Stop   06/05/16 2000  ceFEPIme (MAXIPIME) 500 mg in dextrose 5 % 50 mL IVPB     500 mg 100 mL/hr over 30 Minutes Intravenous Every 24 hours 06/04/16 2025     06/04/16 2215  ceFEPIme (MAXIPIME) 1 g in dextrose 5 % 50 mL IVPB  Status:  Discontinued     1 g 100 mL/hr over 30 Minutes Intravenous Every 8 hours 06/04/16 2200 06/04/16 2201   06/04/16 1845  ceFEPIme (MAXIPIME) 2 g in dextrose 5 %  50 mL IVPB     2 g 100 mL/hr over 30 Minutes Intravenous  Once 06/04/16 1834 06/04/16 2042   06/04/16 1845  vancomycin (VANCOCIN) IVPB 1000 mg/200 mL premix     1,000 mg 200 mL/hr  over 60 Minutes Intravenous  Once 06/04/16 1834 06/04/16 2012        Intake/Output Summary (Last 24 hours) at 06/05/16 1209 Last data filed at 06/05/16 1100  Gross per 24 hour  Intake 1088.75 ml  Output    255 ml  Net 833.75 ml   Filed Weights   06/04/16 1821 06/04/16 2240  Weight: 39.009 kg (86 lb) 41.5 kg (91 lb 7.9 oz)    Objective: Physical Exam: Filed Vitals:   06/05/16 0809 06/05/16 0900 06/05/16 1000 06/05/16 1100  BP:  137/67 122/70 128/71  Pulse:  68 69 67  Temp: 97 F (36.1 C)     TempSrc: Axillary     Resp:  29 19 33  Height:      Weight:      SpO2: 99% 96% 96% 99%    General: Alert, Awake and Oriented to Person. Appear in moderate distress Eyes: PERRL, Conjunctiva normal ENT: Oral Mucosa clear dry. Neck: no JVD, no Abnormal Mass Or lumps Cardiovascular: S1 and S2 Present, aortic systolic Murmur, Peripheral Pulses Present Respiratory: Bilateral Air entry equal and Decreased, Clear to Auscultation, no Crackles, no wheezes Abdomen: Bowel Sound present, Soft and no tenderness Skin: no redness, no Rash  Extremities: no Pedal edema, no calf tenderness Neurologic: Grossly no focal neuro deficit. Bilaterally Equal motor strength  Data Reviewed: CBC:  Recent Labs Lab 05/30/16 0556 05/31/16 0611 06/04/16 1902 06/05/16 0109  WBC 6.7 7.9 12.5* 11.7*  NEUTROABS  --   --  11.1*  --   HGB 9.6* 10.1* 9.9* 10.5*  HCT 32.0* 33.7* 30.8* 33.4*  MCV 87.2 88.0 84.8 88.1  PLT 128* 135* 120* [id number]*   Basic Metabolic Panel:  Recent Labs Lab 05/30/16 0556 05/31/16 0611 06/04/16 1902 06/05/16 0109 06/05/16 0122 06/05/16 0636  NA 153* 155* 162* 161*  --  158*  K 3.0* 3.4* 3.3* 3.2*  --  3.4*  CL 115* 116* 127* 124*  --  124*  CO2 30 30 22 24   --  20*  GLUCOSE 96 123* 92 142*  --  129*  BUN 36* 38* 100* 104*  --  108*  CREATININE 0.79 0.88 4.37* 4.38*  --  4.26*  CALCIUM 10.7* 11.1* 10.2 10.4*  --  10.2  MG  --   --   --   --  2.2  --     Liver Function  Tests:  Recent Labs Lab 06/04/16 1902  AST 47*  ALT 30  ALKPHOS 64  BILITOT 1.1  PROT 6.1*  ALBUMIN 2.5*   No results for input(s): LIPASE, AMYLASE in the last 168 hours.  Recent Labs Lab 06/05/16 0636  AMMONIA 18   Coagulation Profile: No results for input(s): INR, PROTIME in the last 168 hours. Cardiac Enzymes:  Recent Labs Lab 06/05/16 0636  TROPONINI 0.38*   BNP (last 3 results) No results for input(s): PROBNP in the last 8760 hours.  CBG:  Recent Labs Lab 06/05/16 0001 06/05/16 0436 06/05/16 0739 06/05/16 1125  GLUCAP 125* 122* 123* 124*    Studies: US Renal  06/05/2016  CLINICAL DATA:  Acute kidney injury. EXAM: RENAL / URINARY TRACT ULTRASOUND COMPLETE COMPARISON:  02/28/2011 FINDINGS: Right Kidney: Length: 10.7 cm. Increased parenchymal echogenicity. No hydronephrosis. Two  cyst within the mid pole are noted. The larger measures 1.6 x 1.3 x 1.0 cm. Left Kidney: Length: 10.6 cm. Increased parenchymal echogenicity. No hydronephrosis. There are several cysts which appear mildly complex containing low level internal echoes. The largest measures 1.2 x 0.8 x 1.1 cm. Bladder: Partially collapsed around a Foley catheter balloon. Debris identified within the urinary bladder. IMPRESSION: 1. No hydronephrosis. 2. Bilateral echogenic kidneys compatible with chronic medical renal disease. 3. Mildly complex cyst within the left ovary containing internal low level echoes. Consider further evaluation with nonemergent MRI of the kidneys. Electronically Signed   By: Kerby Moors M.D.   On: 06/05/2016 10:16   Dg Chest Port 1 View  06/04/2016  CLINICAL DATA:  Increasing shortness of Breath EXAM: PORTABLE CHEST 1 VIEW COMPARISON:  05/30/16 FINDINGS: Cardiac shadow is stable. The lungs are well aerated bilaterally. Persistent left basilar infiltrate is seen and stable from the prior study. No focal infiltrate is seen. No bony abnormality is noted. IMPRESSION: Stable left basilar  infiltrate. Electronically Signed   By: Inez Catalina M.D.   On: 06/04/2016 19:50     Scheduled Meds: . antiseptic oral rinse  7 mL Mouth Rinse q12n4p  . ceFEPime (MAXIPIME) IV  500 mg Intravenous Q24H  . chlorhexidine  15 mL Mouth Rinse BID  . heparin  5,000 Units Subcutaneous Q8H  . ipratropium-albuterol  3 mL Nebulization BID   Continuous Infusions: . dextrose 75 mL/hr at 06/04/16 2309   PRN Meds: acetaminophen **OR** acetaminophen, albuterol, ondansetron **OR** ondansetron (ZOFRAN) IV  Time spent: 30 minutes  Author: Berle Mull, MD [facility] Pager: [phone] 06/05/2016 12:09 PM  If 7PM-7AM, please contact night-coverage at [email or web address], password [facility]

## 2016-06-05 NOTE — Progress Notes (Signed)
   06/05/16 1400  Clinical Encounter Type  Visited With Patient and family together  Visit Type Initial;Psychological support;Spiritual support;Critical Care  Referral From Nurse  Consult/Referral To Chaplain  Spiritual Encounters  Spiritual Needs Prayer;Emotional;Other (Comment) (Pastoral conversation/Support)  Stress Factors  Patient Stress Factors None identified  Family Stress Factors Other (Comment);Loss;Loss of control;Lack of knowledge   I visited with the patient and his family per referral by the Unit Nursing manager. The patient was awake and alert upon my arrival. His daughter was at the bedside.  When I introduced myself the patient's daughter asked who sent me to speak with them. I explained that I was doing rounds and that there wasn't a particular reason for my visit other than to be an added layer of support for the patient and family.  The patient's daughter stated that she felt disrespected by staff due to the palliative care team coming in to speak with them after she requested that they not. The patient's daughter stated that she understands her father's condition, but does not want to focus on death while he is still alive.  She is not receptive to talking about end-of-life issues at this time.  The patient and his family are very spiritual and rely on prayer for comfort.  The patient's daughter requested that I pray with them; which the patient responded with a nod of the head.  The patient's daughter asked that I pray for healing if it is in god's plan and for God to take control of the situation. She requested that the prayer be hopeful and focus on a positive outcome.  Patient's daughter requested that I come back and pray with the family again.  I will follow-up with this patient and his family.   Please contact Spiritual Care for further assistance.   [place] M.Div.

## 2016-06-05 NOTE — Progress Notes (Addendum)
Rt took pt off BIPAP and placed on 2 LPM [place]. No distress noted at this time. Family and MD at bedside.

## 2016-06-06 DIAGNOSIS — E43 Unspecified severe protein-calorie malnutrition: Secondary | ICD-10-CM

## 2016-06-06 LAB — RENAL FUNCTION PANEL
ALBUMIN: 2.4 g/dL — AB (ref 3.5–5.0)
ANION GAP: 7 (ref 5–15)
ANION GAP: 9 (ref 5–15)
Albumin: 2.2 g/dL — ABNORMAL LOW (ref 3.5–5.0)
BUN: 87 mg/dL — ABNORMAL HIGH (ref 6–20)
BUN: 82 mg/dL — ABNORMAL HIGH (ref 6–20)
CALCIUM: 9.6 mg/dL (ref 8.9–10.3)
CHLORIDE: 120 mmol/L — AB (ref 101–111)
CHLORIDE: 125 mmol/L — AB (ref 101–111)
CO2: 25 mmol/L (ref 22–32)
CO2: 18 mmol/L — ABNORMAL LOW (ref 22–32)
CREATININE: 2.95 mg/dL — AB (ref 0.61–1.24)
Calcium: 9.7 mg/dL (ref 8.9–10.3)
Creatinine, Ser: 3.06 mg/dL — ABNORMAL HIGH (ref 0.61–1.24)
GFR calc Af Amer: 22 mL/min — ABNORMAL LOW (ref >60)
GFR calc non Af Amer: 19 mL/min — ABNORMAL LOW (ref >60)
GFR, EST AFRICAN AMERICAN: 23 mL/min — AB (ref >60)
GFR, EST NON AFRICAN AMERICAN: 19 mL/min — AB (ref >60)
GLUCOSE: 129 mg/dL — AB (ref 65–99)
Glucose, Bld: 118 mg/dL — ABNORMAL HIGH (ref 65–99)
PHOSPHORUS: 3.6 mg/dL (ref 2.5–4.6)
POTASSIUM: 3.5 mmol/L (ref 3.5–5.1)
Phosphorus: 3.5 mg/dL (ref 2.5–4.6)
Potassium: 4 mmol/L (ref 3.5–5.1)
Sodium: 152 mmol/L — ABNORMAL HIGH (ref 135–145)
Sodium: 152 mmol/L — ABNORMAL HIGH (ref 135–145)

## 2016-06-06 LAB — CBC WITH DIFFERENTIAL/PLATELET
Basophils Absolute: 0 10*3/uL (ref 0.0–0.1)
Basophils Relative: 0 %
EOS PCT: 0 %
Eosinophils Absolute: 0 10*3/uL (ref 0.0–0.7)
HCT: 28.9 % — ABNORMAL LOW (ref 39.0–52.0)
HEMOGLOBIN: 9.3 g/dL — AB (ref 13.0–17.0)
LYMPHS ABS: 1 10*3/uL (ref 0.7–4.0)
LYMPHS PCT: 11 %
MCH: 26.9 pg (ref 26.0–34.0)
MCHC: 32.2 g/dL (ref 30.0–36.0)
MCV: 83.5 fL (ref 78.0–100.0)
MONOS PCT: 4 %
Monocytes Absolute: 0.3 10*3/uL (ref 0.1–1.0)
NEUTROS PCT: 85 %
Neutro Abs: 7.9 10*3/uL — ABNORMAL HIGH (ref 1.7–7.7)
Platelets: 93 10*3/uL — ABNORMAL LOW (ref 150–400)
RBC: 3.46 MIL/uL — AB (ref 4.22–5.81)
RDW: 14.4 % (ref 11.5–15.5)
WBC: 9.3 10*3/uL (ref 4.0–10.5)

## 2016-06-06 LAB — GLUCOSE, CAPILLARY
GLUCOSE-CAPILLARY: 121 mg/dL — AB (ref 65–99)
Glucose-Capillary: 115 mg/dL — ABNORMAL HIGH (ref 65–99)
Glucose-Capillary: 120 mg/dL — ABNORMAL HIGH (ref 65–99)

## 2016-06-06 LAB — COMPREHENSIVE METABOLIC PANEL
ALK PHOS: 63 U/L (ref 38–126)
ALT: 30 U/L (ref 17–63)
ANION GAP: 8 (ref 5–15)
AST: 44 U/L — ABNORMAL HIGH (ref 15–41)
Albumin: 2.4 g/dL — ABNORMAL LOW (ref 3.5–5.0)
BUN: 105 mg/dL — ABNORMAL HIGH (ref 6–20)
CALCIUM: 9.9 mg/dL (ref 8.9–10.3)
CO2: 22 mmol/L (ref 22–32)
CREATININE: 3.85 mg/dL — AB (ref 0.61–1.24)
Chloride: 124 mmol/L — ABNORMAL HIGH (ref 101–111)
GFR, EST AFRICAN AMERICAN: 16 mL/min — AB (ref >60)
GFR, EST NON AFRICAN AMERICAN: 14 mL/min — AB (ref >60)
Glucose, Bld: 139 mg/dL — ABNORMAL HIGH (ref 65–99)
Potassium: 3.7 mmol/L (ref 3.5–5.1)
Sodium: 154 mmol/L — ABNORMAL HIGH (ref 135–145)
TOTAL PROTEIN: 6 g/dL — AB (ref 6.5–8.1)
Total Bilirubin: 0.7 mg/dL (ref 0.3–1.2)

## 2016-06-06 LAB — MAGNESIUM: MAGNESIUM: 1.9 mg/dL (ref 1.7–2.4)

## 2016-06-06 LAB — URINE CULTURE

## 2016-06-06 LAB — VANCOMYCIN, RANDOM: VANCOMYCIN RM: 9 ug/mL

## 2016-06-06 MED ORDER — RESOURCE THICKENUP CLEAR PO POWD
ORAL | Status: DC | PRN
  Filled 2016-06-06: qty 125

## 2016-06-06 MED ORDER — CETYLPYRIDINIUM CHLORIDE 0.05 % MT LIQD
7.0000 mL | Freq: Two times a day (BID) | OROMUCOSAL | Status: DC
  Administered 2016-06-08 – 2016-06-10 (×4): 7 mL via OROMUCOSAL

## 2016-06-06 MED ORDER — FLUCONAZOLE IN SODIUM CHLORIDE 100-0.9 MG/50ML-% IV SOLN
100.0000 mg | Freq: Once | INTRAVENOUS | Status: AC
  Administered 2016-06-06: 100 mg via INTRAVENOUS
  Filled 2016-06-06 (×2): qty 50

## 2016-06-06 MED ORDER — FLUCONAZOLE IN SODIUM CHLORIDE 100-0.9 MG/50ML-% IV SOLN
100.0000 mg | INTRAVENOUS | Status: DC
  Administered 2016-06-07 – 2016-06-10 (×4): 100 mg via INTRAVENOUS
  Filled 2016-06-06 (×4): qty 50

## 2016-06-06 NOTE — Progress Notes (Signed)
Pharmacy Antibiotic Note  Bradley [name] is a 75 y.o. male admitted on 06/04/2016 septic 2/2  pneumonia.  Patient was recently admitted (6/1-6/3) for a worsening pneumonia and was treated for CAP with azithromycin/ceftriaxone then transitioned to Levaquin.  Pharmacy has been consulted for Vancomycin and Cefepime dosing.  Code sepsis called.  First doses of Vancomycin 1g and Cefepime 2g already ordered [name].  Plan: - Cefepime 500 mg IV q24h. - Fluconazole 100mg  IV q24h  - MD d/c vancomycin order today.  Height: 5\' 7"  (170.2 cm) Weight: 94 lb 2.2 oz (42.7 kg) IBW/kg (Calculated) : 66.1  Temp (24hrs), Avg:98.2 F (36.8 C), Min:97.7 F (36.5 C), Max:99.1 F (37.3 C)   Recent Labs Lab 05/31/16 0611 06/04/16 1902 06/04/16 1910 06/04/16 2144 06/05/16 0109 06/05/16 0636 06/05/16 1048 06/05/16 1441 06/05/16 1847 06/06/16 0120  WBC 7.9 12.5*  --   --  11.7*  --   --   --   --  9.3  CREATININE 0.88 4.37*  --   --  4.38* 4.26* 4.35* 4.13* 4.05* 3.85*  LATICACIDVEN  --   --  2.14* 2.08*  --  1.6  --   --   --   --     Estimated Creatinine Clearance: 10.2 mL/min (by C-G formula based on Cr of 3.85).    No Known Allergies  Antimicrobials this admission: 6/7 Vanc >> 6/9 6/7 Cefepime >>  6/9 Fluconazole >>  Dose adjustments this admission:  Microbiology results: 6/7 BCx: ngtd 6/7 UCx: > 100k Yeast 6/7 MRSA PCR: negative  Thank you for allowing pharmacy to be a part of this patient's care.  Gretta Arab PharmD, BCPS Pager [phone] 06/06/2016 1:45 PM

## 2016-06-06 NOTE — Progress Notes (Signed)
[facility] Progress Note  Patient: Bradley [name] [id number]   PCP: [name], MD DOB: [birth year]   DOA: 06/04/2016   DOS: 06/06/2016   Date of Service: the patient was seen and examined on 06/06/2016  Subjective: Patient continues to remain lethargic with minimal oral intake. Follows command Nutrition: Dysphagia Diet but Minimal Oral Intake Due To Lethargic  Brief hospital course: Patient was admitted on 06/04/2016, with complaint of unresponsive episode, was found to have Acute kidney injury with hypernatremia as well as acute hypoxic respiratory failure requiring BiPAP. Currently further plan is continue monitoring for renal function.  Assessment and Plan: 1. Acute renal failure superimposed on stage 3 chronic kidney disease (HCC)  hypernatremia.  Patient presents with serum creatinine of 4.3 as well as sodium of 161. Likely prerenal since the patient has been having poor oral intake for last 3 days with continued use of Lasix. Improving with IV hydration. Urine output is also improving after changing fluids from D5-D5 half normal saline. Unremarkable renal ultrasound. Continue Foley catheter. Nephrology consulted. Appreciate input We'll continue to monitor. Serum BMP every 4 hours.  2.  suspected sepsis with UTI as well as healthcare associated pneumonia  Presented with tachycardia, lactic acidosis, acute respiratory failure with requiring BiPAP. Continuing IV cefepime at present. No indication for vancomycin at present Monitor for culture. Appears to have UTI but no evidence of pneumonia. Urine is also growing yeast we'll treat with fluconazole.  3.abnormal TSH. Thyromegaly. 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 TSH is significantly low. Elevated free T4. Continue methimazole.  4.pressure ulcer. Protein calorie malnutrition likely severe. Nutritional consult. Speech consult for swallowing recommends dysphagia type I diet. Continue frequent turning  and repositioning.  5.goals of care discussion.  severe aortic stenosis. CVA. Chronic dysphagia. Discussed with patient's daughter who is the power of attorney regarding goals of care. She had a discussion with her father that he does not want to be artificially kept alive. On admission the patient was DO NOT INTUBATE. I explained, Regarding goals of resuscitation with CPR. After understanding the daughter requested the patient to be DNR/DNI since she mentions "if he is dead he is dead". I will give the patient and patient's daughter a copy of MOST form to review.  6. History of CVA. Resume aspirin.  Pain management: When necessary Tylenol  Activity: consulted physical therapy Bowel regimen: last BM prior to admission Diet: dysphagia type I diet DVT Prophylaxis: subcutaneous Heparin  Advance goals of care discussion: Please see above. As per my discussion patient is transitioned to DNR/DNI.   Family Communication: family was present at bedside, at the time of interview. The pt provided permission to discuss medical plan with the family. Opportunity was given to ask question and all questions were answered satisfactorily.   Disposition:  Discharge to SNF versus home with home health, Expected discharge date: 06/09/2016  Consultants: Palliative care, nephrology  Procedures: ultrasound renal pending   Antibiotics: Anti-infectives    Start     Dose/Rate Route Frequency Ordered Stop   06/07/16 1000  fluconazole (DIFLUCAN) IVPB 100 mg     100 mg 50 mL/hr over 60 Minutes Intravenous Every 24 hours 06/06/16 1031     06/06/16 1200  fluconazole (DIFLUCAN) IVPB 100 mg     100 mg 50 mL/hr over 60 Minutes Intravenous  Once 06/06/16 1031 06/06/16 1430   06/05/16 2000  ceFEPIme (MAXIPIME) 500 mg in dextrose 5 % 50 mL IVPB     500 mg 100 mL/hr  over 30 Minutes Intravenous Every 24 hours 06/04/16 2025     06/04/16 2215  ceFEPIme (MAXIPIME) 1 g in dextrose 5 % 50 mL IVPB  Status:   Discontinued     1 g 100 mL/hr over 30 Minutes Intravenous Every 8 hours 06/04/16 2200 06/04/16 2201   06/04/16 1845  ceFEPIme (MAXIPIME) 2 g in dextrose 5 % 50 mL IVPB     2 g 100 mL/hr over 30 Minutes Intravenous  Once 06/04/16 1834 06/04/16 2042   06/04/16 1845  vancomycin (VANCOCIN) IVPB 1000 mg/200 mL premix     1,000 mg 200 mL/hr over 60 Minutes Intravenous  Once 06/04/16 1834 06/04/16 2012        Intake/Output Summary (Last 24 hours) at 06/06/16 1659 Last data filed at 06/06/16 1100  Gross per 24 hour  Intake 2531.25 ml  Output    570 ml  Net 1961.25 ml   Filed Weights   06/04/16 1821 06/04/16 2240 06/06/16 1210  Weight: 39.009 kg (86 lb) 41.5 kg (91 lb 7.9 oz) 42.7 kg (94 lb 2.2 oz)    Objective: Physical Exam: Filed Vitals:   06/06/16 0800 06/06/16 0918 06/06/16 1000 06/06/16 1210  BP: 158/133  132/78 154/78  Pulse: 62  58 54  Temp: 97.9 F (36.6 C)   97.9 F (36.6 C)  TempSrc: Oral   Axillary  Resp: 23  24 20   Height:    5\' 7"  (1.702 m)  Weight:    42.7 kg (94 lb 2.2 oz)  SpO2: 98% 100% 98% 99%    General: Alert, Awake and Oriented to Person. Appear in moderate distress Eyes: PERRL, Conjunctiva normal ENT: Oral Mucosa clear Moist. Neck: no JVD, Left sided goiter Cardiovascular: S1 and S2 Present, aortic systolic Murmur, Respiratory: Bilateral Air entry equal and Decreased, Clear to Auscultation, no Crackles, no wheezes Abdomen: Bowel Sound present, Soft and no tenderness Extremities: no Pedal edema, no calf tenderness Neurologic: Generalized weakness  Data Reviewed: CBC:  Recent Labs Lab 05/31/16 0611 06/04/16 1902 06/05/16 0109 06/06/16 0120  WBC 7.9 12.5* 11.7* 9.3  NEUTROABS  --  11.1*  --  7.9*  HGB 10.1* 9.9* 10.5* 9.3*  HCT 33.7* 30.8* 33.4* 28.9*  MCV 88.0 84.8 88.1 83.5  PLT 135* 120* 113* 93*   Basic Metabolic Panel:  Recent Labs Lab 06/05/16 0122 06/05/16 0636 06/05/16 1048 06/05/16 1441 06/05/16 1847 06/06/16 0120  NA   --  158* 159* 156* 155* 154*  K  --  3.4* 3.2* 3.2* 3.8 3.7  CL  --  124* 124* 123* 123* 124*  CO2  --  20* 24 23 20* 22  GLUCOSE  --  129* 132* 132* 129* 139*  BUN  --  108* 103* 105* 105* 105*  CREATININE  --  4.26* 4.35* 4.13* 4.05* 3.85*  CALCIUM  --  10.2 10.3 10.3 10.2 9.9  MG 2.2  --   --  2.1  --  1.9    Liver Function Tests:  Recent Labs Lab 06/04/16 1902 06/06/16 0120  AST 47* 44*  ALT 30 30  ALKPHOS 64 63  BILITOT 1.1 0.7  PROT 6.1* 6.0*  ALBUMIN 2.5* 2.4*   No results for input(s): LIPASE, AMYLASE in the last 168 hours.  Recent Labs Lab 06/05/16 0636  AMMONIA 18   Coagulation Profile: No results for input(s): INR, PROTIME in the last 168 hours. Cardiac Enzymes:  Recent Labs Lab 06/05/16 0636  TROPONINI 0.38*   BNP (last 3 results) No  results for input(s): PROBNP in the last 8760 hours.  CBG:  Recent Labs Lab 06/05/16 1125 06/05/16 1551 06/05/16 1932 06/06/16 0623 06/06/16 0732  GLUCAP 124* 121* 130* 115* 120*    Studies: No results found.   Scheduled Meds: . antiseptic oral rinse  7 mL Mouth Rinse q12n4p  . antiseptic oral rinse  7 mL Mouth Rinse BID  . aspirin  81 mg Oral Daily  . ceFEPime (MAXIPIME) IV  500 mg Intravenous Q24H  . [START ON 06/07/2016] fluconazole (DIFLUCAN) IV  100 mg Intravenous Q24H  . heparin  5,000 Units Subcutaneous Q8H  . ipratropium-albuterol  3 mL Nebulization BID  . methimazole  5 mg Oral Daily   Continuous Infusions: . dextrose 5 % and 0.45% NaCl 125 mL/hr at 06/05/16 1557   PRN Meds: acetaminophen **OR** acetaminophen, albuterol, ALPRAZolam, ondansetron **OR** ondansetron (ZOFRAN) IV  Time spent: 30 minutes  Author: Berle Mull, MD [facility] Pager: [phone] 06/06/2016 4:59 PM  If 7PM-7AM, please contact night-coverage at [email or web address], password [facility]

## 2016-06-06 NOTE — Progress Notes (Signed)
Speech Language Pathology Treatment: Dysphagia  Patient Details Name: Bradley [name] MRN: [id number] DOB: 03/19/1941 Today's Date: 06/06/2016 Time: [id number] SLP Time Calculation (min) (ACUTE ONLY): 46 min  Assessment / Plan / Recommendation Clinical Impression  Pt's caregiver in room with pt and admits to pt having worsening swallow function since his recent pna.  Educated pt and niece to chronicity of dysphagia, possible ongoing aspiration and importance of hydration.  Pt is xerostomic currently and therefore SLP provided oral care via toothette and set up oral suction.  Pt clearly desiring po intake and SlP observed him consuming honey thick apple juice, applesauce and single small ice chip via tsp.  Delayed swallow noted across all consistencies and wet vocal quality x1 noted after liquid consumption.  Verbal cues to clear throat/cough effective to eliminate wet voice - ? Secretions or po on vocal cords.   Using teach back and visual cues, educated pt and caregiver to need to examine laryngeal elevation to assure pt swallows.    Given pt with renal failure, suspect hydration is a concern.  Provided information to niece re: Lorenza Chick protocol to allow pt thin water between meals for hydration/QOL with minimal risk.    Also advised to modification in diet to mitigate risk but not preventative.       HPI HPI: 75 y.o. male with medical history significant of recent admitted for pneumonia was brought to the ER after patient became lethargic and decreased response. Patient's family states that since discharge patient has become progressively weak and over the last 2 days has hardly eaten anything but did take his medications yesterday. Today patient became very lethargic with hardly any response and patient was brought to the ER. Initially patient was mildly hypotensive and was given fluid bolus. Labs showing acute renal failure with creatinine around 4 with normal creatinine just 4 days ago. Patient's  sodium is also around 161. After admission patient is becoming more responsive and following commands. Patient is also in respiratory failure requiring BiPAP. Chest x-ray shows stable infiltrates. Patient's family at this time as requested no intubation. Patient has history of stroke with difficulty speaking. As per the family patient has become more bedbound recently. Previously used to use a walker. Patient also has severe aortic stenosis and cardiology has felt patient is not a candidate for intervention due to poor functional capacity.      SLP Plan  Continue with current plan of care     Recommendations  Diet recommendations: Honey-thick liquid;Dysphagia 1 (puree) Liquids provided via: Teaspoon Medication Administration: Crushed with puree Supervision: Full supervision/cueing for compensatory strategies;Trained caregiver to feed patient Compensations: Slow rate;Small sips/bites;Other (Comment) (clear throat when voice is wet) Postural Changes and/or Swallow Maneuvers: Seated upright 90 degrees;Upright 30-60 min after meal             Oral Care Recommendations: Oral care BID Follow up Recommendations: 24 hour supervision/assistance Plan: Continue with current plan of care     [place], [facility] SLP [phone]

## 2016-06-06 NOTE — Consult Note (Signed)
Admit: 06/04/2016 LOS: 2  49M with CKD3 admit with AoCKD3, hypernatremia/dehydration, subacute FTT, recent Tx for PNA, and new UTI  Subjective:  Remains somnolent / lethargic Niece in room, daughter Sharyn Lull udpated on phone  UOP appears to be increasing SCr down, Na down Urine Cx with yeast, now on fluconazole + cefepime  06/08 0701 - 06/09 0700 In: 3127.5 [I.V.:2777.5; IV Piggyback:350] Out: 320 [Urine:320]  Filed Weights   06/04/16 1821 06/04/16 2240  Weight: 39.009 kg (86 lb) 41.5 kg (91 lb 7.9 oz)    Scheduled Meds: . antiseptic oral rinse  7 mL Mouth Rinse q12n4p  . antiseptic oral rinse  7 mL Mouth Rinse BID  . aspirin  81 mg Oral Daily  . ceFEPime (MAXIPIME) IV  500 mg Intravenous Q24H  . fluconazole (DIFLUCAN) IV  100 mg Intravenous Once  . [START ON 06/07/2016] fluconazole (DIFLUCAN) IV  100 mg Intravenous Q24H  . heparin  5,000 Units Subcutaneous Q8H  . ipratropium-albuterol  3 mL Nebulization BID  . methimazole  5 mg Oral Daily   Continuous Infusions: . dextrose 5 % and 0.45% NaCl 125 mL/hr at 06/05/16 1557   PRN Meds:.acetaminophen **OR** acetaminophen, albuterol, ALPRAZolam, ondansetron **OR** ondansetron (ZOFRAN) IV  Current Labs: reviewed    Physical Exam:  Blood pressure 132/78, pulse 58, temperature 97.9 F (36.6 C), temperature source Oral, resp. rate 24, height 5\' 7"  (1.702 m), weight 41.5 kg (91 lb 7.9 oz), SpO2 98 %. NAD, frail Temporal wasting Low muscle mass RRR No Edema Foley cath present CTAB  A 1. AoCKD3, oliguric, 2/2 prerenal/?evolved to ATN 2. Hypernatremia/Dehydration; poor PO intake, using lasix at home 3. FTT 4. UTI 5. Hypokalemia, improved  P 1. Seems to be slowly improving, cont IVFs at current settings 2. Cont to engage with family for overall health and goals of care 3. Not an HD candidate, nor is it indicated at current time   Pearson Grippe MD 06/06/2016, 11:58 AM   Recent Labs Lab 06/05/16 1441 06/05/16 1847  06/06/16 0120  NA 156* 155* 154*  K 3.2* 3.8 3.7  CL 123* 123* 124*  CO2 23 20* 22  GLUCOSE 132* 129* 139*  BUN 105* 105* 105*  CREATININE 4.13* 4.05* 3.85*  CALCIUM 10.3 10.2 9.9    Recent Labs Lab 06/04/16 1902 06/05/16 0109 06/06/16 0120  WBC 12.5* 11.7* 9.3  NEUTROABS 11.1*  --  7.9*  HGB 9.9* 10.5* 9.3*  HCT 30.8* 33.4* 28.9*  MCV 84.8 88.1 83.5  PLT 120* 113* 93*

## 2016-06-06 NOTE — Progress Notes (Signed)
Initial Nutrition Assessment  DOCUMENTATION CODES:   Severe malnutrition in context of chronic illness, Underweight  INTERVENTION:  - If TF within GOC/POC/wishes of family moving forward, recommend Jevity 1.2 @ 45 mL/hr via NGT versus PEG. This regimen will provide 1296 kcal, 60 grams of protein, and 872 mL free water. - RD will continue to monitor GOC/POC related to medical course and family wishes.  NUTRITION DIAGNOSIS:   Malnutrition related to chronic illness as evidenced by severe depletion of muscle mass, severe depletion of body fat, energy intake < or equal to 50% for > or equal to 5 days.  GOAL:   Patient will meet greater than or equal to 90% of their needs  MONITOR:   Weight trends, Labs, Skin, I & O's  REASON FOR ASSESSMENT:   Other (Comment) (Low BMI)  ASSESSMENT:   75 y.o. male with medical history significant of recent admitted for pneumonia was brought to the ER after patient became lethargic and decreased response. Patient's family states that since discharge patient has become progressively weak and over the last 2 days has hardly eaten anything but did take his medications yesterday. Today patient became very lethargic with hardly any response and patient was brought to the ER. Initially patient was mildly hypotensive and was given fluid bolus. Labs showing acute renal failure with creatinine around 4 with normal creatinine just 4 days ago. Patient's sodium is also around 161. After admission patient is becoming more responsive and following commands. Patient is also in respiratory failure requiring BiPAP. Chest x-ray shows stable infiltrates. Patient's family at this time as requested no intubation. Patient has history of stroke with difficulty speaking. As per the family patient has become more bedbound recently. Previously used to use a walker. Patient also has severe aortic stenosis and cardiology has felt patient is not a candidate for intervention due to poor  functional capacity.  Pt seen for low BMI. No intakes documented since admission. Spoke with niece who is POA who is at bedside at this time. She states that pt was provided with Dysphagia 1, honey-thick liquids at home for several months PTA. Pt previously had a very good appetite, particularly liked sweet items, but since admission at [facility] for PNA he has had a decrease in appetite and intakes. Niece states that pt has had increasing difficulty with swallowing and that feeding is very stressful for her and pt due to limited ability to swallow. Pt being followed by SLP at this time with associated note written 6/8 at 1027. Palliative Care visited pt's room 6/8 but niece not ready to discuss with them at that time; will continue to monitor for decisions regarding POC/GOC.   Unable to perform physical assessment due to tech and nephrologist working with pt but pt is very cachectic in appearance and RD assessment 05/30/16 states severe muscle and fat wasting to upper and lower body. Niece states pt unable to eat/drin for 4 days PTA (admitted 06/04/16). Niece states that prior to admission at [facility] pt weighed 118-120 lbs which is consistent with weights in chart. Per review, pt has lost 27 lbs (23% body weight) in the past 1.5-2 months which is significant for time frame.  TF recommendations outlined above should they be warranted. Not meeting needs. Medications reviewed. IVF: D5-1/2 NS @ 125 mL/hr (510 kcal). Labs reviewed; CBGs: 115 and 120 mg/dL this AM, Na: 154 mmol/L, Cl: 124 mmol/L, BUN/creatinine elevated, AST elevated, GFR: 16.   Diet Order:  DIET - DYS 1 Room  service appropriate?: Yes; Fluid consistency:: Honey Thick  Skin:  Wound (see comment) (Stage 2 sacral pressure injury)  Last BM:  6/8  Height:   Ht Readings from Last 1 Encounters:  06/04/16 5\' 7"  (1.702 m)    Weight:   Wt Readings from Last 1 Encounters:  06/04/16 91 lb 7.9 oz (41.5 kg)    Ideal Body Weight:  67.27 kg  (kg)  BMI:  Body mass index is 14.33 kg/(m^2).  Estimated Nutritional Needs:   Kcal:  1150-1350  Protein:  50-60 grams  Fluid:  >/= 1.3 L/day  EDUCATION NEEDS:   No education needs identified at this time     Jarome Matin, RD, LDN Inpatient Clinical Dietitian Pager # [phone] After hours/weekend pager # [phone]

## 2016-06-07 ENCOUNTER — Inpatient Hospital Stay (HOSPITAL_COMMUNITY): Payer: Medicare Other

## 2016-06-07 DIAGNOSIS — J189 Pneumonia, unspecified organism: Secondary | ICD-10-CM | POA: Diagnosis present

## 2016-06-07 LAB — RENAL FUNCTION PANEL
ALBUMIN: 2.3 g/dL — AB (ref 3.5–5.0)
Albumin: 2.2 g/dL — ABNORMAL LOW (ref 3.5–5.0)
Anion gap: 7 (ref 5–15)
Anion gap: 8 (ref 5–15)
BUN: 73 mg/dL — ABNORMAL HIGH (ref 6–20)
BUN: 81 mg/dL — AB (ref 6–20)
CALCIUM: 9.6 mg/dL (ref 8.9–10.3)
CHLORIDE: 121 mmol/L — AB (ref 101–111)
CO2: 22 mmol/L (ref 22–32)
CO2: 22 mmol/L (ref 22–32)
CREATININE: 2.62 mg/dL — AB (ref 0.61–1.24)
CREATININE: 2.79 mg/dL — AB (ref 0.61–1.24)
Calcium: 9.3 mg/dL (ref 8.9–10.3)
Chloride: 123 mmol/L — ABNORMAL HIGH (ref 101–111)
GFR calc Af Amer: 24 mL/min — ABNORMAL LOW (ref >60)
GFR calc non Af Amer: 22 mL/min — ABNORMAL LOW (ref >60)
GFR, EST AFRICAN AMERICAN: 26 mL/min — AB (ref >60)
GFR, EST NON AFRICAN AMERICAN: 21 mL/min — AB (ref >60)
GLUCOSE: 113 mg/dL — AB (ref 65–99)
GLUCOSE: 118 mg/dL — AB (ref 65–99)
PHOSPHORUS: 3.2 mg/dL (ref 2.5–4.6)
POTASSIUM: 3.3 mmol/L — AB (ref 3.5–5.1)
Phosphorus: 3.1 mg/dL (ref 2.5–4.6)
Potassium: 3.2 mmol/L — ABNORMAL LOW (ref 3.5–5.1)
SODIUM: 151 mmol/L — AB (ref 135–145)
SODIUM: 152 mmol/L — AB (ref 135–145)

## 2016-06-07 LAB — GLUCOSE, CAPILLARY
GLUCOSE-CAPILLARY: 97 mg/dL (ref 65–99)
GLUCOSE-CAPILLARY: 113 mg/dL — AB (ref 65–99)
GLUCOSE-CAPILLARY: 94 mg/dL (ref 65–99)
Glucose-Capillary: 111 mg/dL — ABNORMAL HIGH (ref 65–99)
Glucose-Capillary: 119 mg/dL — ABNORMAL HIGH (ref 65–99)

## 2016-06-07 MED ORDER — LEVOFLOXACIN 500 MG PO TABS
500.0000 mg | ORAL_TABLET | ORAL | Status: DC
  Filled 2016-06-07: qty 1

## 2016-06-07 MED ORDER — CEPHALEXIN 250 MG/5ML PO SUSR
500.0000 mg | Freq: Two times a day (BID) | ORAL | Status: DC
  Administered 2016-06-07: 500 mg via ORAL
  Filled 2016-06-07 (×2): qty 10

## 2016-06-07 MED ORDER — LEVOFLOXACIN 25 MG/ML PO SOLN: 500.0000 mg | ORAL | Status: DC

## 2016-06-07 NOTE — Consult Note (Signed)
Admit: 06/04/2016 LOS: 3  38M with CKD3 admit with AoCKD3, hypernatremia/dehydration, subacute FTT, recent Tx for PNA, and new UTI  Subjective:  Much more awake/alert, in chair today Good improvement in SCr, Na, inc UOP Remains on IVFs  06/09 0701 - 06/10 0700 In: 3150 [P.O.:100; I.V.:3000; IV Piggyback:50] Out: [id number] [Urine:1735]  Filed Weights   06/04/16 1821 06/04/16 2240 06/06/16 1210  Weight: 39.009 kg (86 lb) 41.5 kg (91 lb 7.9 oz) 42.7 kg (94 lb 2.2 oz)    Scheduled Meds: . antiseptic oral rinse  7 mL Mouth Rinse q12n4p  . antiseptic oral rinse  7 mL Mouth Rinse BID  . aspirin  81 mg Oral Daily  . cephALEXin  500 mg Oral Q12H  . fluconazole (DIFLUCAN) IV  100 mg Intravenous Q24H  . heparin  5,000 Units Subcutaneous Q8H  . ipratropium-albuterol  3 mL Nebulization BID  . methimazole  5 mg Oral Daily   Continuous Infusions: . dextrose 5 % and 0.45% NaCl 75 mL/hr at 06/07/16 0925   PRN Meds:.acetaminophen **OR** acetaminophen, albuterol, ALPRAZolam, ondansetron **OR** ondansetron (ZOFRAN) IV, RESOURCE THICKENUP CLEAR  Current Labs: reviewed    Physical Exam:  Blood pressure 143/71, pulse 59, temperature 97.4 F (36.3 C), temperature source Oral, resp. rate 24, height 5\' 7"  (1.702 m), weight 42.7 kg (94 lb 2.2 oz), SpO2 97 %. NAD, frail Temporal wasting Low muscle mass RRR No Edema Foley cath present CTAB  A 1. AoCKD3, oliguric, 2/2 prerenal/?evolved to ATN 2. Hypernatremia/Dehydration; poor PO intake, using lasix at home 3. FTT 4. UTI with yeast on Cx 5. Hypokalemia, improved  P 1. Seems to be slowly improving, cont IVFs at current settings until tolerating PO 2. Cont to engage with family for overall health and goals of care 3. Not an HD candidate, nor is it indicated at current time Will sign off for now.  Please call with any questions or concerns.  Pt does need follow up with nephrology and will need to arrange closer to discharge.      Pearson Grippe  MD 06/07/2016, 9:57 AM   Recent Labs Lab 06/06/16 2114 06/07/16 0054 06/07/16 0535  NA 152* 152* 151*  K 4.0 3.3* 3.2*  CL 125* 123* 121*  CO2 18* 22 22  GLUCOSE 118* 113* 118*  BUN 82* 81* 73*  CREATININE 2.95* 2.79* 2.62*  CALCIUM 9.6 9.6 9.3  PHOS 3.5 3.2 3.1    Recent Labs Lab 06/04/16 1902 06/05/16 0109 06/06/16 0120  WBC 12.5* 11.7* 9.3  NEUTROABS 11.1*  --  7.9*  HGB 9.9* 10.5* 9.3*  HCT 30.8* 33.4* 28.9*  MCV 84.8 88.1 83.5  PLT 120* 113* 93*

## 2016-06-07 NOTE — Progress Notes (Signed)
[facility] Progress Note  Patient: Bradley [name] [id number]   PCP: [name], MD DOB: 04/10/1941   DOA: 06/04/2016   DOS: 06/07/2016   Date of Service: the patient was seen and examined on 06/07/2016  Subjective: Is improving as per family. Patient is also following command. No nausea no vomiting. No acute events identified overnight. Nutrition: Dysphagia Diet but Minimal Oral Intake  Brief hospital course: Patient was admitted on 06/04/2016, with complaint of unresponsive episode, was found to have Acute kidney injury with hypernatremia as well as acute hypoxic respiratory failure requiring BiPAP. Currently further plan is continue monitoring for renal function.  Assessment and Plan: 1. Acute renal failure superimposed on stage 3 chronic kidney disease (HCC)  hypernatremia.  Patient presents with serum creatinine of 4.3 as well as sodium of 161. Likely prerenal since the patient has been having poor oral intake for last 3 days with continued use of Lasix. Improving with IV hydration. Urine output is also improving after changing fluids from D5-D5 half normal saline. Unremarkable renal ultrasound. Continue Foley catheter. Nephrology consulted. Appreciate input We'll continue to monitor. Reduce her IV fluid rate from 125-75 mL per hour.  2.  suspected sepsis with UTI as well as healthcare associated pneumonia  Presented with tachycardia, lactic acidosis, acute respiratory failure with requiring BiPAP. Initially treated with cefepime Monitor for culture. Appears to have UTI. Chest x-ray today shows evidence of advance infiltrate on the left side progressive from admission. MRSA PCR negative. Would change antibiotics to Levaquin Urine is also growing yeast we'll treat with fluconazole.  3.abnormal TSH. Thyromegaly. 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 TSH is significantly low. Elevated free T4. Continue methimazole.  4.pressure ulcer. Protein calorie  malnutrition likely severe. Nutritional consult. Speech consult for swallowing recommends dysphagia type I diet. Continue frequent turning and repositioning.  5.goals of care discussion.  severe aortic stenosis. CVA. Chronic dysphagia. Discussed with patient's daughter who is the power of attorney regarding goals of care. She had a discussion with her father that he does not want to be artificially kept alive. On admission the patient was DO NOT INTUBATE. I explained, Regarding goals of resuscitation with CPR. After understanding the daughter requested the patient to be DNR/DNI since she mentions "if he is dead he is dead".  6. History of CVA. Resume aspirin.  Pain management: When necessary Tylenol  Activity: consulted physical therapy daughter deferred and would like to go home with home health Bowel regimen: last BM prior to admission Diet: dysphagia type I diet DVT Prophylaxis: subcutaneous Heparin  Advance goals of care discussion: Please see above. As per my discussion patient is transitioned to DNR/DNI.   Family Communication: family was present at bedside, at the time of interview. The pt provided permission to discuss medical plan with the family. Opportunity was given to ask question and all questions were answered satisfactorily.   Disposition:  Discharge to SNF versus home with home health, Expected discharge date: 06/09/2016  Consultants: Palliative care, nephrology  Procedures: ultrasound renal pending   Antibiotics: Anti-infectives    Start     Dose/Rate Route Frequency Ordered Stop   06/07/16 1800  levofloxacin (LEVAQUIN) 25 MG/ML solution 500 mg     500 mg Oral Every 48 hours 06/07/16 1756     06/07/16 1000  fluconazole (DIFLUCAN) IVPB 100 mg     100 mg 50 mL/hr over 60 Minutes Intravenous Every 24 hours 06/06/16 1031     06/07/16 1000  cephALEXin (KEFLEX) 250 MG/5ML  suspension 500 mg  Status:  Discontinued     500 mg Oral Every 12 hours 06/07/16 0906  06/07/16 1755   06/06/16 1200  fluconazole (DIFLUCAN) IVPB 100 mg     100 mg 50 mL/hr over 60 Minutes Intravenous  Once 06/06/16 1031 06/06/16 1430   06/05/16 2000  ceFEPIme (MAXIPIME) 500 mg in dextrose 5 % 50 mL IVPB  Status:  Discontinued     500 mg 100 mL/hr over 30 Minutes Intravenous Every 24 hours 06/04/16 2025 06/07/16 0906   06/04/16 2215  ceFEPIme (MAXIPIME) 1 g in dextrose 5 % 50 mL IVPB  Status:  Discontinued     1 g 100 mL/hr over 30 Minutes Intravenous Every 8 hours 06/04/16 2200 06/04/16 2201   06/04/16 1845  ceFEPIme (MAXIPIME) 2 g in dextrose 5 % 50 mL IVPB     2 g 100 mL/hr over 30 Minutes Intravenous  Once 06/04/16 1834 06/04/16 2042   06/04/16 1845  vancomycin (VANCOCIN) IVPB 1000 mg/200 mL premix     1,000 mg 200 mL/hr over 60 Minutes Intravenous  Once 06/04/16 1834 06/04/16 2012        Intake/Output Summary (Last 24 hours) at 06/07/16 1757 Last data filed at 06/07/16 1300  Gross per 24 hour  Intake   2775 ml  Output   1685 ml  Net   1090 ml   Filed Weights   06/04/16 1821 06/04/16 2240 06/06/16 1210  Weight: 39.009 kg (86 lb) 41.5 kg (91 lb 7.9 oz) 42.7 kg (94 lb 2.2 oz)    Objective: Physical Exam: Filed Vitals:   06/06/16 2100 06/07/16 0603 06/07/16 1132 06/07/16 1500  BP: 128/79 143/71  150/94  Pulse: 65 59  118  Temp: 98.3 F (36.8 C) 97.4 F (36.3 C)  98.3 F (36.8 C)  TempSrc: Oral Oral  Oral  Resp: 24 24  18   Height:      Weight:      SpO2: 100% 97% 92% 94%    General: Alert, Awake and Oriented to Person. Appear in moderate distress Eyes: PERRL, Conjunctiva normal ENT: Oral Mucosa clear Moist. Neck: no JVD, Left sided goiter Cardiovascular: S1 and S2 Present, aortic systolic Murmur, Respiratory: Bilateral Air entry equal and Decreased, Left basal Crackles, no wheezes Abdomen: Bowel Sound present, Soft and no tenderness Extremities: no Pedal edema, no calf tenderness Neurologic: Generalized weakness  Data Reviewed: CBC:  Recent  Labs Lab 06/04/16 1902 06/05/16 0109 06/06/16 0120  WBC 12.5* 11.7* 9.3  NEUTROABS 11.1*  --  7.9*  HGB 9.9* 10.5* 9.3*  HCT 30.8* 33.4* 28.9*  MCV 84.8 88.1 83.5  PLT 120* 113* 93*   Basic Metabolic Panel:  Recent Labs Lab 06/05/16 0122  06/05/16 1441  06/06/16 0120 06/06/16 1718 06/06/16 2114 06/07/16 0054 06/07/16 0535  NA  --   < > 156*  < > 154* 152* 152* 152* 151*  K  --   < > 3.2*  < > 3.7 3.5 4.0 3.3* 3.2*  CL  --   < > 123*  < > 124* 120* 125* 123* 121*  CO2  --   < > 23  < > 22 25 18* 22 22  GLUCOSE  --   < > 132*  < > 139* 129* 118* 113* 118*  BUN  --   < > 105*  < > 105* 87* 82* 81* 73*  CREATININE  --   < > 4.13*  < > 3.85* 3.06* 2.95* 2.79* 2.62*  CALCIUM  --   < > 10.3  < > 9.9 9.7 9.6 9.6 9.3  MG 2.2  --  2.1  --  1.9  --   --   --   --   PHOS  --   --   --   --   --  3.6 3.5 3.2 3.1  < > = values in this interval not displayed.  Liver Function Tests:  Recent Labs Lab 06/04/16 1902 06/06/16 0120 06/06/16 1718 06/06/16 2114 06/07/16 0054 06/07/16 0535  AST 47* 44*  --   --   --   --   ALT 30 30  --   --   --   --   ALKPHOS 64 63  --   --   --   --   BILITOT 1.1 0.7  --   --   --   --   PROT 6.1* 6.0*  --   --   --   --   ALBUMIN 2.5* 2.4* 2.4* 2.2* 2.3* 2.2*   No results for input(s): LIPASE, AMYLASE in the last 168 hours.  Recent Labs Lab 06/05/16 0636  AMMONIA 18   Coagulation Profile: No results for input(s): INR, PROTIME in the last 168 hours. Cardiac Enzymes:  Recent Labs Lab 06/05/16 0636  TROPONINI 0.38*   BNP (last 3 results) No results for input(s): PROBNP in the last 8760 hours.  CBG:  Recent Labs Lab 06/06/16 2059 06/07/16 0541 06/07/16 0740 06/07/16 1148 06/07/16 1707  GLUCAP 121* 111* 119* 97 113*    Studies: Dg Chest Port 1 View  06/07/2016  CLINICAL DATA:  75 year old male with a history of shortness of breath, hypertension EXAM: PORTABLE CHEST 1 VIEW COMPARISON:  06/04/2016, 05/30/2016 FINDINGS:  Cardiomediastinal silhouette partially obscured by a left rotation of the patient. Cardiomegaly persists. Increasing opacity at the left base with obscuration of the retrocardiac region in the left hemidiaphragm. Soft tissues obscure evaluation of the left apex. Right lung relatively well aerated. Calcifications of the aortic arch. Ill-defined interstitial opacity in the left mid lung. IMPRESSION: Aeration maintained on the right. Opacity at the left base appears to be worsening, which may be secondary to the positioning of the patient, or worsening consolidation, volume loss, and/ or pleural fluid. Signed, Dulcy Fanny. Earleen Newport, DO [facility] Radiology Electronically Signed   By: Corrie Mckusick D.O.   On: 06/07/2016 10:20     Scheduled Meds: . antiseptic oral rinse  7 mL Mouth Rinse q12n4p  . antiseptic oral rinse  7 mL Mouth Rinse BID  . aspirin  81 mg Oral Daily  . fluconazole (DIFLUCAN) IV  100 mg Intravenous Q24H  . heparin  5,000 Units Subcutaneous Q8H  . ipratropium-albuterol  3 mL Nebulization BID  . levofloxacin  500 mg Oral Q48H  . methimazole  5 mg Oral Daily   Continuous Infusions: . dextrose 5 % and 0.45% NaCl 75 mL/hr at 06/07/16 1016   PRN Meds: acetaminophen **OR** acetaminophen, albuterol, ALPRAZolam, ondansetron **OR** ondansetron (ZOFRAN) IV, RESOURCE THICKENUP CLEAR  Time spent: 30 minutes  Author: Berle Mull, MD [facility] Pager: [phone] 06/07/2016 5:57 PM  If 7PM-7AM, please contact night-coverage at [email or web address], password [facility]

## 2016-06-07 NOTE — Evaluation (Signed)
Physical Therapy Evaluation Patient Details Name: Bradley [name] MRN: [id number] DOB: 04/07/1941 Today's Date: 06/07/2016   History of Present Illness  pt admitted 06/04/2016 with unresponsive episode. Found to have acute kidney injuty, hypernatremia, and hypoxic respiratory failure. He has suspected sepsis and UTI,  Past history includes CVA and recent hospitalizaion for pneumonia he also has a wound on sacrum   Clinical Impression  Pt requires significant +2 assist.  Daughter says she has all assist and equipment at home and is prepared for pt to go to his home with caregivers. She says he  needed significant assist before admission.  Feel he would benefit from a consult with [place] nurses for advice about sacral wound care.  Pt has a sheepskin chair cusion and sleeps on a regular bed at home.  He may benefit from a gel cushion or an air overlay (if possible to be used his bed) as recommended by [facility]OC.      Follow Up Recommendations No PT follow up;Other (comment) (daughter deferred HHPT, but will ask if she wants it )    Equipment Recommendations  None recommended by PT ( hoyer lift and gel cushion for chair if family agrees/ )    Recommendations for Other Services OT consult     Precautions / Restrictions Precautions Precautions: Fall Restrictions Weight Bearing Restrictions: No      Mobility  Bed Mobility Overal bed mobility: +2 for physical assistance             General bed mobility comments: Pt reaches with arms to try to assist   Transfers Overall transfer level: Needs assistance   Transfers: Sit to/from Stand;Squat Pivot Transfers     Squat pivot transfers: +2 physical assistance     General transfer comment: Pt is not able to stand erect or help with transfters.  atttempted sit to stand from transter in chair and he was not able to stand erect or control right leg for support.   Ambulation/Gait                Stairs            Wheelchair Mobility     Modified Rankin (Stroke Patients Only)       Balance Overall balance assessment: Needs assistance Sitting-balance support: Bilateral upper extremity supported;Feet supported Sitting balance-Leahy Scale: Poor Sitting balance - Comments: pt needs assist to find midline fot attempted balance control.  Is not able to sit on the edge of the bed without hands on assist to hold him up  Postural control: Posterior lean;Left lateral lean Standing balance support: During functional activity Standing balance-Leahy Scale: Poor Standing balance comment: marked flexed posture and right leg in hyperextension with standing support                              Pertinent Vitals/Pain Pain Assessment: No/denies pain    Home Living Family/patient expects to be discharged to:: Private residence Living Arrangements: Alone Available Help at Discharge: Personal care attendant;Available 24 hours/day Type of Home: Apartment Home Access: Stairs to enter (daughter says her husband carries pt up the steps )     Home Layout: One level   Additional Comments: daughter reports they have 24 hour assist at home and have all equipement and assist as needed. She says they do not need addtional services or equipment but is interested in talking to the [place] nurse to find our more about wound care  or pressure relieving cushion/mattress     Prior Function Level of Independence: Independent with assistive device(s)         Comments: daugter is very involved in patient care     Hand Dominance        Extremity/Trunk Assessment   Upper Extremity Assessment: Defer to OT evaluation           Lower Extremity Assessment: RLE deficits/detail RLE Deficits / Details: ROM appears WFL, though pt is very thin, he was not able to perform AROM to command and had difficulty contolling RLE in stance-- he locked it in hyperextension for support in standing .  LLE Deficits / Details: pt very think with  prominenty bony surfaces,  He assisted with range of motion, but did not lift his lef against gravity   Cervical / Trunk Assessment: Kyphotic  Communication   Communication: Other (comment) (speech slow and labored, hard to understand at times)  Cognition Arousal/Alertness: Awake/alert   Overall Cognitive Status: Within Functional Limits for tasks assessed                      General Comments General comments (skin integrity, edema, etc.): pt has urinary catheter and was incontinent of stool. He has large wound at sacrum that is being treated with allevyn. He is very thin with multiple bony prominences at all areas     Exercises        Assessment/Plan    PT Assessment    PT Diagnosis Difficulty walking;Abnormality of gait;Generalized weakness   PT Problem List    PT Treatment Interventions     PT Goals (Current goals can be found in the Care Plan section) Acute Rehab PT Goals Patient Stated Goal: to go home  PT Goal Formulation: With patient/family Time For Goal Achievement: 06/21/16 Potential to Achieve Goals: Fair    Frequency     Barriers to discharge        Co-evaluation               End of Session Equipment Utilized During Treatment: Oxygen;Other (comment) Activity Tolerance: Patient limited by fatigue Patient left: in chair;with nursing/sitter in room;with family/visitor present Nurse Communication: Need for lift equipment         Time: [id number] PT Time Calculation (min) (ACUTE ONLY): 54 min   Charges:   PT Evaluation $PT Eval Moderate Complexity: 1 Procedure     PT G Codes:       Laverna Dossett K. Owens Shark, PT  Norwood Levo 06/07/2016, 11:01 AM

## 2016-06-07 NOTE — Progress Notes (Signed)
I visited with Mr Bradley [name] per referral of a weekday chaplain. Mr Bradley [name] was alert but not capable of expressing himself due to stroke damage to his face. Daughter Bradley [name] is still very sensitive about any talk about anything except invoking God's power to heal her father. She feels any negative talk will depress him and cause him to lose hope. I was asked to pray for comfort and healing. Daughter Bradley [name] did express her profound thanksgiving for the exceptional care given Mr Bradley [name], and the personal touch provided by staff.  Mr Bradley [name] and his daughter are deeply spiritual people that look at physical healing and care through the lens of their faith. The question given is "is this God's will" before "what can be done to improve his quality of life." Chaplains should be included in any difficult encounters to assist staff in dealing with may appear to be resistance of care, and avoidance of reality.  Sallee Lange. Aero Drummonds,DMin, Mina

## 2016-06-08 ENCOUNTER — Inpatient Hospital Stay (HOSPITAL_COMMUNITY): Payer: Medicare Other

## 2016-06-08 DIAGNOSIS — E870 Hyperosmolality and hypernatremia: Secondary | ICD-10-CM

## 2016-06-08 LAB — GLUCOSE, CAPILLARY
GLUCOSE-CAPILLARY: 96 mg/dL (ref 65–99)
GLUCOSE-CAPILLARY: 95 mg/dL (ref 65–99)
GLUCOSE-CAPILLARY: 95 mg/dL (ref 65–99)
GLUCOSE-CAPILLARY: 112 mg/dL — AB (ref 65–99)
Glucose-Capillary: 117 mg/dL — ABNORMAL HIGH (ref 65–99)

## 2016-06-08 LAB — RENAL FUNCTION PANEL
Albumin: 2 g/dL — ABNORMAL LOW (ref 3.5–5.0)
Anion gap: 7 (ref 5–15)
BUN: 62 mg/dL — ABNORMAL HIGH (ref 6–20)
CALCIUM: 9 mg/dL (ref 8.9–10.3)
CHLORIDE: 122 mmol/L — AB (ref 101–111)
CO2: 22 mmol/L (ref 22–32)
CREATININE: 1.87 mg/dL — AB (ref 0.61–1.24)
GFR calc Af Amer: 39 mL/min — ABNORMAL LOW (ref >60)
GFR calc non Af Amer: 34 mL/min — ABNORMAL LOW (ref >60)
GLUCOSE: 100 mg/dL — AB (ref 65–99)
Phosphorus: 3.2 mg/dL (ref 2.5–4.6)
Potassium: 3.5 mmol/L (ref 3.5–5.1)
SODIUM: 151 mmol/L — AB (ref 135–145)

## 2016-06-08 LAB — URINALYSIS, ROUTINE W REFLEX MICROSCOPIC
Bilirubin Urine: NEGATIVE
Glucose, UA: NEGATIVE mg/dL
KETONES UR: NEGATIVE mg/dL
NITRITE: NEGATIVE
PROTEIN: 30 mg/dL — AB
Specific Gravity, Urine: 1.014 (ref 1.005–1.030)
pH: 5 (ref 5.0–8.0)

## 2016-06-08 LAB — URINE MICROSCOPIC-ADD ON: BACTERIA UA: NONE SEEN

## 2016-06-08 LAB — CBC
HEMATOCRIT: 27.6 % — AB (ref 39.0–52.0)
HEMOGLOBIN: 9 g/dL — AB (ref 13.0–17.0)
MCH: 27.4 pg (ref 26.0–34.0)
MCHC: 32.6 g/dL (ref 30.0–36.0)
MCV: 84.1 fL (ref 78.0–100.0)
Platelets: 95 10*3/uL — ABNORMAL LOW (ref 150–400)
RBC: 3.28 MIL/uL — ABNORMAL LOW (ref 4.22–5.81)
RDW: 14.2 % (ref 11.5–15.5)
WBC: 9 10*3/uL (ref 4.0–10.5)

## 2016-06-08 LAB — MAGNESIUM: Magnesium: 1.7 mg/dL (ref 1.7–2.4)

## 2016-06-08 MED ORDER — CEPHALEXIN 250 MG/5ML PO SUSR
500.0000 mg | Freq: Two times a day (BID) | ORAL | Status: DC
  Administered 2016-06-08 – 2016-06-10 (×5): 500 mg via ORAL
  Filled 2016-06-08 (×6): qty 10

## 2016-06-08 MED ORDER — MEGESTROL ACETATE 400 MG/10ML PO SUSP
400.0000 mg | Freq: Every day | ORAL | Status: DC
  Administered 2016-06-08 – 2016-06-10 (×3): 400 mg via ORAL
  Filled 2016-06-08 (×3): qty 10

## 2016-06-08 NOTE — Progress Notes (Signed)
[facility] Progress Note  Patient: Bradley [name] [id number]   PCP: [name], MD DOB: [birth year]   DOA: 06/04/2016   DOS: Jun 11, 2016   Date of Service: the patient was seen and examined on 06-11-2016  Subjective: Patient is unable to take tablets by mouth. No other acute events identified overnight. No nausea no vomiting. Minimal oral intake. Nutrition: Dysphagia Diet but Minimal Oral Intake  Brief hospital course: Patient was admitted on 06/04/2016, with complaint of unresponsive episode, was found to have Acute kidney injury with hypernatremia as well as acute hypoxic respiratory failure requiring BiPAP. Significant improvement with IV hydration. Based on my discussion with family the patient will be going home with hospice. Currently further plan is continue monitoring for renal function.  Assessment and Plan: 1. Acute renal failure superimposed on stage 3 chronic kidney disease (HCC)  hypernatremia.  Patient presents with serum creatinine of 4.3 as well as sodium of 161. Likely prerenal since the patient has been having poor oral intake for last 3 days with continued use of Lasix. Improving with IV hydration. Urine output is also improving after changing fluids from D5-D5 half normal saline. Unremarkable renal ultrasound. Continue Foley catheter. Nephrology consulted. Appreciate input We'll continue to monitor. Reduce her IV fluid rate from 125-75 mL per hour.  2.  suspected sepsis with UTI as well as healthcare associated pneumonia  Presented with tachycardia, lactic acidosis, acute respiratory failure with requiring BiPAP. Initially treated with cefepime Monitor for culture. Appears to have UTI. Chest x-ray today shows evidence of advance infiltrate on the left side progressive from admission. MRSA PCR negative. Would change antibiotics to Keflex Urine is also growing yeast we'll treat with fluconazole.  3.abnormal TSH. Thyromegaly. 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 TSH is significantly low. Elevated free T4. Continue methimazole.  4.pressure ulcer. Protein calorie malnutrition likely severe. Nutritional consult. Speech consult for swallowing recommends dysphagia type I diet. Continue frequent turning and repositioning.  5.goals of care discussion.  severe aortic stenosis. CVA. Chronic dysphagia. Discussed with patient's daughter who is the power of attorney regarding goals of care. She had a discussion with her father that he does not want to be artificially kept alive. On admission the patient was DO NOT INTUBATE. I explained, Regarding goals of resuscitation with CPR. After understanding the daughter requested the patient to be DNR/DNI since she mentions "if he is dead he is dead".  I discussed on 06-11-16 with daughter regarding goals of care, given patient's limited oral intake as well as recurrent aspiration and severe aortic stenosis family agreed to discuss with hospice regarding discharge planning with home with hospice. Social worker and case management consult placed.  6. History of CVA. Resume aspirin.  Pain management: When necessary Tylenol  Activity: consulted physical therapy daughter deferred and would like to go home with home health Bowel regimen: last BM 2016/06/11 Diet: dysphagia type I diet DVT Prophylaxis: subcutaneous Heparin  Advance goals of care discussion: Please see above. As per my discussion patient is transitioned to DNR/DNI. Patient will go home with hospice  Family Communication: family was present at bedside, at the time of interview. The pt provided permission to discuss medical plan with the family. Opportunity was given to ask question and all questions were answered satisfactorily.   Disposition:  Discharge to SNF versus home with home health, Expected discharge date: 06/09/2016  Consultants: Palliative care, nephrology  Procedures: ultrasound renal pending    Antibiotics: Anti-infectives    Start  Dose/Rate Route Frequency Ordered Stop   06/08/16 1300  cephALEXin (KEFLEX) 250 MG/5ML suspension 500 mg     500 mg Oral Every 12 hours 06/08/16 1232     06/07/16 1830  levofloxacin (LEVAQUIN) tablet 500 mg  Status:  Discontinued     500 mg Oral Every 48 hours 06/07/16 1805 06/08/16 1232   06/07/16 1800  levofloxacin (LEVAQUIN) 25 MG/ML solution 500 mg  Status:  Discontinued     500 mg Oral Every 48 hours 06/07/16 1756 06/07/16 1804   06/07/16 1000  fluconazole (DIFLUCAN) IVPB 100 mg     100 mg 50 mL/hr over 60 Minutes Intravenous Every 24 hours 06/06/16 1031     06/07/16 1000  cephALEXin (KEFLEX) 250 MG/5ML suspension 500 mg  Status:  Discontinued     500 mg Oral Every 12 hours 06/07/16 0906 06/07/16 1755   06/06/16 1200  fluconazole (DIFLUCAN) IVPB 100 mg     100 mg 50 mL/hr over 60 Minutes Intravenous  Once 06/06/16 1031 06/06/16 1430   06/05/16 2000  ceFEPIme (MAXIPIME) 500 mg in dextrose 5 % 50 mL IVPB  Status:  Discontinued     500 mg 100 mL/hr over 30 Minutes Intravenous Every 24 hours 06/04/16 2025 06/07/16 0906   06/04/16 2215  ceFEPIme (MAXIPIME) 1 g in dextrose 5 % 50 mL IVPB  Status:  Discontinued     1 g 100 mL/hr over 30 Minutes Intravenous Every 8 hours 06/04/16 2200 06/04/16 2201   06/04/16 1845  ceFEPIme (MAXIPIME) 2 g in dextrose 5 % 50 mL IVPB     2 g 100 mL/hr over 30 Minutes Intravenous  Once 06/04/16 1834 06/04/16 2042   06/04/16 1845  vancomycin (VANCOCIN) IVPB 1000 mg/200 mL premix     1,000 mg 200 mL/hr over 60 Minutes Intravenous  Once 06/04/16 1834 06/04/16 2012        Intake/Output Summary (Last 24 hours) at 06/08/16 1949 Last data filed at 06/08/16 1500  Gross per 24 hour  Intake   1650 ml  Output    875 ml  Net    775 ml   Filed Weights   06/04/16 1821 06/04/16 2240 06/06/16 1210  Weight: 39.009 kg (86 lb) 41.5 kg (91 lb 7.9 oz) 42.7 kg (94 lb 2.2 oz)    Objective: Physical Exam: Filed Vitals:    06/07/16 1946 06/07/16 2045 06/08/16 0421 06/08/16 1455  BP:  132/71 153/78 119/66  Pulse:  77 128 67  Temp:  97.7 F (36.5 C) 97 F (36.1 C) 97.6 F (36.4 C)  TempSrc:  Oral Axillary Axillary  Resp:  24 20 16   Height:      Weight:      SpO2: 93% 97% 99% 100%    General: Alert, Awake and Oriented to Person. Appear in moderate distress Eyes: PERRL, Conjunctiva normal ENT: Oral Mucosa clear Moist. Neck: no JVD, Left sided goiter Cardiovascular: S1 and S2 Present, aortic systolic Murmur, Respiratory: Bilateral Air entry equal and Decreased, Left basal Crackles, no wheezes Abdomen: Bowel Sound present, Soft and no tenderness Extremities: no Pedal edema, no calf tenderness Neurologic: Generalized weakness  Data Reviewed: CBC:  Recent Labs Lab 06/04/16 1902 06/05/16 0109 06/06/16 0120 06/08/16 0557  WBC 12.5* 11.7* 9.3 9.0  NEUTROABS 11.1*  --  7.9*  --   HGB 9.9* 10.5* 9.3* 9.0*  HCT 30.8* 33.4* 28.9* 27.6*  MCV 84.8 88.1 83.5 84.1  PLT 120* 113* 93* 95*   Basic Metabolic Panel:  Recent  Labs Lab 06/05/16 0122  06/05/16 1441  06/06/16 0120 06/06/16 1718 06/06/16 2114 06/07/16 0054 06/07/16 0535 06/08/16 0557  NA  --   < > 156*  < > 154* 152* 152* 152* 151* 151*  K  --   < > 3.2*  < > 3.7 3.5 4.0 3.3* 3.2* 3.5  CL  --   < > 123*  < > 124* 120* 125* 123* 121* 122*  CO2  --   < > 23  < > 22 25 18* 22 22 22   GLUCOSE  --   < > 132*  < > 139* 129* 118* 113* 118* 100*  BUN  --   < > 105*  < > 105* 87* 82* 81* 73* 62*  CREATININE  --   < > 4.13*  < > 3.85* 3.06* 2.95* 2.79* 2.62* 1.87*  CALCIUM  --   < > 10.3  < > 9.9 9.7 9.6 9.6 9.3 9.0  MG 2.2  --  2.1  --  1.9  --   --   --   --  1.7  PHOS  --   --   --   --   --  3.6 3.5 3.2 3.1 3.2  < > = values in this interval not displayed.  Liver Function Tests:  Recent Labs Lab 06/04/16 1902 06/06/16 0120 06/06/16 1718 06/06/16 2114 06/07/16 0054 06/07/16 0535 06/08/16 0557  AST 47* 44*  --   --   --   --   --    ALT 30 30  --   --   --   --   --   ALKPHOS 64 63  --   --   --   --   --   BILITOT 1.1 0.7  --   --   --   --   --   PROT 6.1* 6.0*  --   --   --   --   --   ALBUMIN 2.5* 2.4* 2.4* 2.2* 2.3* 2.2* 2.0*   No results for input(s): LIPASE, AMYLASE in the last 168 hours.  Recent Labs Lab 06/05/16 0636  AMMONIA 18   Coagulation Profile: No results for input(s): INR, PROTIME in the last 168 hours. Cardiac Enzymes:  Recent Labs Lab 06/05/16 0636  TROPONINI 0.38*   BNP (last 3 results) No results for input(s): PROBNP in the last 8760 hours.  CBG:  Recent Labs Lab 06/07/16 2037 06/08/16 0411 06/08/16 0745 06/08/16 1029 06/08/16 1743  GLUCAP 94 96 95 95 117*    Studies: No results found.   Scheduled Meds: . antiseptic oral rinse  7 mL Mouth Rinse q12n4p  . antiseptic oral rinse  7 mL Mouth Rinse BID  . aspirin  81 mg Oral Daily  . cephALEXin  500 mg Oral Q12H  . fluconazole (DIFLUCAN) IV  100 mg Intravenous Q24H  . heparin  5,000 Units Subcutaneous Q8H  . ipratropium-albuterol  3 mL Nebulization BID  . methimazole  5 mg Oral Daily   Continuous Infusions: . dextrose 5 % and 0.45% NaCl 75 mL/hr at 06/08/16 1550   PRN Meds: acetaminophen **OR** acetaminophen, albuterol, ALPRAZolam, ondansetron **OR** ondansetron (ZOFRAN) IV, RESOURCE THICKENUP CLEAR  Time spent: 30 minutes  Author: Berle Mull, MD [facility] Pager: [phone] 06/08/2016 7:49 PM  If 7PM-7AM, please contact night-coverage at [email or web address], password [facility]

## 2016-06-08 NOTE — Progress Notes (Signed)
Assumed care of patient at this time. Patient is stable with no complaints at this time. Agree with previously documented assessment. Will continue to monitor patient.    Bradley [name] [facility] 06/08/2016

## 2016-06-08 NOTE — Progress Notes (Signed)
Pt coughed 1-2 times upon giving oral solution medications. Pt's daughter states she is worried that he aspirated. No cough presently, O2 saturation WNL, no distress in breathing, lung sounds clear to this RN. Fredirick Maudlin paged. Will continue to monitor pt closely. Carnella Guadalajara I

## 2016-06-09 LAB — BASIC METABOLIC PANEL
ANION GAP: 6 (ref 5–15)
BUN: 50 mg/dL — ABNORMAL HIGH (ref 6–20)
CALCIUM: 9.2 mg/dL (ref 8.9–10.3)
CHLORIDE: 120 mmol/L — AB (ref 101–111)
CO2: 24 mmol/L (ref 22–32)
Creatinine, Ser: 1.47 mg/dL — ABNORMAL HIGH (ref 0.61–1.24)
GFR calc Af Amer: 52 mL/min — ABNORMAL LOW (ref >60)
GFR calc non Af Amer: 45 mL/min — ABNORMAL LOW (ref >60)
GLUCOSE: 108 mg/dL — AB (ref 65–99)
Potassium: 2.8 mmol/L — ABNORMAL LOW (ref 3.5–5.1)
Sodium: 150 mmol/L — ABNORMAL HIGH (ref 135–145)

## 2016-06-09 LAB — GLUCOSE, CAPILLARY
GLUCOSE-CAPILLARY: 90 mg/dL (ref 65–99)
GLUCOSE-CAPILLARY: 118 mg/dL — AB (ref 65–99)
Glucose-Capillary: 106 mg/dL — ABNORMAL HIGH (ref 65–99)
Glucose-Capillary: 112 mg/dL — ABNORMAL HIGH (ref 65–99)
Glucose-Capillary: 99 mg/dL (ref 65–99)

## 2016-06-09 LAB — RENAL FUNCTION PANEL
ANION GAP: 6 (ref 5–15)
Albumin: 2.2 g/dL — ABNORMAL LOW (ref 3.5–5.0)
BUN: 51 mg/dL — ABNORMAL HIGH (ref 6–20)
CALCIUM: 9.2 mg/dL (ref 8.9–10.3)
CHLORIDE: 120 mmol/L — AB (ref 101–111)
CO2: 24 mmol/L (ref 22–32)
CREATININE: 1.52 mg/dL — AB (ref 0.61–1.24)
GFR calc Af Amer: 50 mL/min — ABNORMAL LOW (ref >60)
GFR calc non Af Amer: 43 mL/min — ABNORMAL LOW (ref >60)
GLUCOSE: 109 mg/dL — AB (ref 65–99)
Phosphorus: 2.8 mg/dL (ref 2.5–4.6)
Potassium: 2.7 mmol/L — CL (ref 3.5–5.1)
SODIUM: 150 mmol/L — AB (ref 135–145)

## 2016-06-09 LAB — CULTURE, BLOOD (ROUTINE X 2)
CULTURE: NO GROWTH
Culture: NO GROWTH

## 2016-06-09 MED ORDER — POTASSIUM CHLORIDE CRYS ER 10 MEQ PO TBCR
40.0000 meq | EXTENDED_RELEASE_TABLET | Freq: Two times a day (BID) | ORAL | Status: DC
  Administered 2016-06-09 – 2016-06-10 (×2): 40 meq via ORAL
  Filled 2016-06-09 (×2): qty 4

## 2016-06-09 MED ORDER — POTASSIUM CHLORIDE 10 MEQ/100ML IV SOLN
10.0000 meq | INTRAVENOUS | Status: AC
  Administered 2016-06-09 (×4): 10 meq via INTRAVENOUS
  Filled 2016-06-09 (×5): qty 100

## 2016-06-09 NOTE — Care Management Note (Signed)
Case Management Note  Patient Details  Name: Bradley [name] MRN: [id number] Date of Birth: 12-21-1941  Subjective/Objective:  Received referral from atteding to offer home hospice resources. Spoke to dtr-primary caregiver-Bradley [name] [phone]-about home hospice resources. After clarification of home hospice qualification which was determined by attending-spoke to dtr again who agreed to going home w/hospice services-she will let me know which provider tomorrow.Left home hospice provider list in rm.Dtr has all dme needed @ home, & can transport home on own.Attending updated.                 Action/Plan:d/c home w/hospice   Expected Discharge Date:   (unknown)               Expected Discharge Plan:  Home w Hospice Care  In-House Referral:  NA  Discharge planning Services  CM Consult  Post Acute Care Choice:  NA Choice offered to:  Adult Children  DME Arranged:    DME Agency:     HH Arranged:    HH Agency:     Status of Service:  In process, will continue to follow  Medicare Important Message Given:  Yes Date Medicare IM Given:    Medicare IM give by:    Date Additional Medicare IM Given:    Additional Medicare Important Message give by:     If discussed at [place] of Stay Meetings, dates discussed:    Additional Comments:  Dessa Phi, RN 06/09/2016, 12:11 PM

## 2016-06-09 NOTE — Progress Notes (Signed)
Physical Therapy Treatment Patient Details Name: Bradley [name] MRN: [id number] DOB: 1941-06-06 Today's Date: 07-09-2016    History of Present Illness pt admitted 06/04/2016 with unresponsive episode. Found to have acute kidney injury, hypernatremia, and hypoxic respiratory failure. He has suspected sepsis and UTI,  Past history includes CVA and recent hospitalizaion for pneumonia he also has a wound on sacrum     PT Comments    Pt assisted out of bed to recliner by squat pivot transfer.  Notified RN of how to safely assist pt back to bed.  Follow Up Recommendations  No PT follow up     Equipment Recommendations  Other (comment) (hoyer lift, gel cushion)    Recommendations for Other Services       Precautions / Restrictions Precautions Precautions: Fall    Mobility  Bed Mobility Overal bed mobility: +2 for physical assistance;Needs Assistance Bed Mobility: Supine to Sit     Supine to sit: Total assist;+2 for physical assistance     General bed mobility comments: pt slightly assisted moving LEs over EOB, increased assist for trunk upright and scooting to EOB  Transfers Overall transfer level: Needs assistance   Transfers: Squat Pivot Transfers     Squat pivot transfers: Total assist     General transfer comment: pt unable to assist, performed squat pivot over to recliner, pt repositioned with pillows in recliner (MD then into room)  Ambulation/Gait                 Stairs            Wheelchair Mobility    Modified Rankin (Stroke Patients Only)       Balance                                    Cognition Arousal/Alertness: Awake/alert   Overall Cognitive Status: Within Functional Limits for tasks assessed                      Exercises      General Comments        Pertinent Vitals/Pain Pain Assessment: Faces Faces Pain Scale: Hurts little more Pain Location: L ribcage area Pain Intervention(s):  (RN in room and  aware)    Home Living                      Prior Function            PT Goals (current goals can now be found in the care plan section) Progress towards PT goals: Progressing toward goals    Frequency       PT Plan Current plan remains appropriate    Co-evaluation             End of Session Equipment Utilized During Treatment: Gait belt Activity Tolerance: Patient limited by fatigue Patient left: in chair;with nursing/sitter in room;with chair alarm set     Time: 0953-1006 PT Time Calculation (min) (ACUTE ONLY): 13 min  Charges:  $Therapeutic Activity: 8-22 mins                    G Codes:      Jamar Casagrande,KATHrine E 09-Jul-2016, 12:29 PM Carmelia Bake, PT, DPT 2016/07/09 Pager: [phone]

## 2016-06-09 NOTE — Progress Notes (Signed)
CRITICAL VALUE ALERT  Critical value received:  K+ 2.7  Date of notification:  06/09/2016  Time of notification:  [id number]  Critical value read back:Yes.    Nurse who received alert:  Carnella Guadalajara I   MD notified (1st page):  Fredirick Maudlin, NP  Time of first page:  [phone]

## 2016-06-09 NOTE — Progress Notes (Signed)
[facility] Progress Note  Patient: Bradley [name] [id number]   PCP: [name], MD DOB: 1941/07/03   DOA: 06/04/2016   DOS: 06/09/2016   Date of Service: the patient was seen and examined on 06/09/2016  Subjective: Patient this morning was sitting up in the chair and working with physical therapy. Patient tells me that he is tired, does not want any further blood work Nutrition: Dysphagia Diet but Minimal Oral Intake  Brief hospital course: Patient was admitted on 06/04/2016, with complaint of unresponsive episode, was found to have Acute kidney injury with hypernatremia as well as acute hypoxic respiratory failure requiring BiPAP. Significant improvement with IV hydration. Based on my discussion with family the patient will be going home with hospice. Currently further plan is continue monitoring for renal function.  Assessment and Plan: 1. Acute renal failure superimposed on stage 3 chronic kidney disease (HCC)  hypernatremia.  Patient presents with serum creatinine of 4.3 as well as sodium of 161. Likely prerenal since the patient has been having poor oral intake for last 3 days with continued use of Lasix. Improving with IV hydration. Urine output is also improving  Unremarkable renal ultrasound. Continue Foley catheter. Nephrology consulted. Appreciate input We'll continue to monitor. We'll stop the fluids due to increased respiratory distress.  2.  suspected sepsis with UTI as well as healthcare associated pneumonia  Presented with tachycardia, lactic acidosis, acute respiratory failure with requiring BiPAP. Initially treated with cefepime Monitor for culture. Appears to have UTI. Chest x-ray shows evidence of advance infiltrate on the left side progressive from admission. MRSA PCR negative. Would change antibiotics to Keflex Urine is also growing yeast we'll treat with fluconazole.  3.abnormal TSH. Thyromegaly. Patient has chronic thyromegaly as per  family. TSH is significantly low. Elevated free T4. Continue methimazole.  4.pressure ulcer. Protein calorie malnutrition likely severe. Nutritional consult. Speech consult for swallowing recommends dysphagia type I diet. Continue frequent turning and repositioning.  5.goals of care discussion.  severe aortic stenosis. CVA. Chronic dysphagia. Discussed with patient's daughter who is the power of attorney regarding goals of care. She had a discussion with her father that he does not want to be artificially kept alive. On admission the patient was DO NOT INTUBATE. I explained, Regarding goals of resuscitation with CPR. After understanding the daughter requested the patient to be DNR/DNI since she mentions "if he is dead he is dead".  I discussed on 01-Jul-2016 with daughter regarding goals of care, given patient's limited oral intake as well as recurrent aspiration and severe aortic stenosis family agreed to discuss with hospice regarding discharge planning with home with hospice. Social worker and case management consult placed.  6. History of CVA. Resume aspirin.  Pain management: When necessary Tylenol  Activity: consulted physical therapy daughter deferred and would like to go home with home health Bowel regimen: last BM 07/01/16 Diet: dysphagia type I diet DVT Prophylaxis: subcutaneous Heparin  Advance goals of care discussion: Please see above. As per my discussion patient is transitioned to DNR/DNI. Patient will go home with hospice  Family Communication: family was present at bedside, at the time of interview.  Disposition:  Discharge to home with hospice Expected discharge date: 06/09/2016  Consultants: Palliative care, nephrology  Procedures: ultrasound renal pending   Antibiotics: Anti-infectives    Start     Dose/Rate Route Frequency Ordered Stop   2016-07-01 1300  cephALEXin (KEFLEX) 250 MG/5ML suspension 500 mg     500 mg Oral Every 12 hours 2016-07-01 1232  06/07/16 1830  levofloxacin (LEVAQUIN) tablet 500 mg  Status:  Discontinued     500 mg Oral Every 48 hours 06/07/16 1805 06/08/16 1232   06/07/16 1800  levofloxacin (LEVAQUIN) 25 MG/ML solution 500 mg  Status:  Discontinued     500 mg Oral Every 48 hours 06/07/16 1756 06/07/16 1804   06/07/16 1000  fluconazole (DIFLUCAN) IVPB 100 mg     100 mg 50 mL/hr over 60 Minutes Intravenous Every 24 hours 06/06/16 1031     06/07/16 1000  cephALEXin (KEFLEX) 250 MG/5ML suspension 500 mg  Status:  Discontinued     500 mg Oral Every 12 hours 06/07/16 0906 06/07/16 1755   06/06/16 1200  fluconazole (DIFLUCAN) IVPB 100 mg     100 mg 50 mL/hr over 60 Minutes Intravenous  Once 06/06/16 1031 06/06/16 1430   06/05/16 2000  ceFEPIme (MAXIPIME) 500 mg in dextrose 5 % 50 mL IVPB  Status:  Discontinued     500 mg 100 mL/hr over 30 Minutes Intravenous Every 24 hours 06/04/16 2025 06/07/16 0906   06/04/16 2215  ceFEPIme (MAXIPIME) 1 g in dextrose 5 % 50 mL IVPB  Status:  Discontinued     1 g 100 mL/hr over 30 Minutes Intravenous Every 8 hours 06/04/16 2200 06/04/16 2201   06/04/16 1845  ceFEPIme (MAXIPIME) 2 g in dextrose 5 % 50 mL IVPB     2 g 100 mL/hr over 30 Minutes Intravenous  Once 06/04/16 1834 06/04/16 2042   06/04/16 1845  vancomycin (VANCOCIN) IVPB 1000 mg/200 mL premix     1,000 mg 200 mL/hr over 60 Minutes Intravenous  Once 06/04/16 1834 06/04/16 2012        Intake/Output Summary (Last 24 hours) at 06/09/16 1729 Last data filed at 06/09/16 1500  Gross per 24 hour  Intake   1300 ml  Output   1100 ml  Net    200 ml   Filed Weights   06/04/16 2240 06/06/16 1210 06/09/16 0448  Weight: 41.5 kg (91 lb 7.9 oz) 42.7 kg (94 lb 2.2 oz) 41.4 kg (91 lb 4.3 oz)    Objective: Physical Exam: Filed Vitals:   06/09/16 0429 06/09/16 0448 06/09/16 0854 06/09/16 1429  BP: 132/73   124/62  Pulse: 66   69  Temp: 97.8 F (36.6 C)   97.9 F (36.6 C)  TempSrc: Axillary   Axillary  Resp: 16   16  Height:       Weight:  41.4 kg (91 lb 4.3 oz)    SpO2: 95%  91% 97%    General: Alert, Awake and Oriented to Person. Appear in moderate distress Eyes: PERRL, Conjunctiva normal ENT: Oral Mucosa clear Moist. Neck: no JVD, Left sided goiter Cardiovascular: S1 and S2 Present, aortic systolic Murmur, Respiratory: Bilateral Air entry equal and Decreased, Left basal Crackles, no wheezes Abdomen: Bowel Sound present, Soft and no tenderness Extremities: no Pedal edema, no calf tenderness Neurologic: Generalized weakness  Data Reviewed: CBC:  Recent Labs Lab 06/04/16 1902 06/05/16 0109 06/06/16 0120 06/08/16 0557  WBC 12.5* 11.7* 9.3 9.0  NEUTROABS 11.1*  --  7.9*  --   HGB 9.9* 10.5* 9.3* 9.0*  HCT 30.8* 33.4* 28.9* 27.6*  MCV 84.8 88.1 83.5 84.1  PLT 120* 113* 93* 95*   Basic Metabolic Panel:  Recent Labs Lab 06/05/16 0122  06/05/16 1441  06/06/16 0120  06/06/16 2114 06/07/16 0054 06/07/16 0535 06/08/16 0557 06/09/16 0452  NA  --   < > 156*  < >  154*  < > 152* 152* 151* 151* 150*  150*  K  --   < > 3.2*  < > 3.7  < > 4.0 3.3* 3.2* 3.5 2.8*  2.7*  CL  --   < > 123*  < > 124*  < > 125* 123* 121* 122* 120*  120*  CO2  --   < > 23  < > 22  < > 18* 22 22 22 24  24   GLUCOSE  --   < > 132*  < > 139*  < > 118* 113* 118* 100* 108*  109*  BUN  --   < > 105*  < > 105*  < > 82* 81* 73* 62* 50*  51*  CREATININE  --   < > 4.13*  < > 3.85*  < > 2.95* 2.79* 2.62* 1.87* 1.47*  1.52*  CALCIUM  --   < > 10.3  < > 9.9  < > 9.6 9.6 9.3 9.0 9.2  9.2  MG 2.2  --  2.1  --  1.9  --   --   --   --  1.7  --   PHOS  --   --   --   --   --   < > 3.5 3.2 3.1 3.2 2.8  < > = values in this interval not displayed.  Liver Function Tests:  Recent Labs Lab 06/04/16 1902 06/06/16 0120  06/06/16 2114 06/07/16 0054 06/07/16 0535 06/08/16 0557 06/09/16 0452  AST 47* 44*  --   --   --   --   --   --   ALT 30 30  --   --   --   --   --   --   ALKPHOS 64 63  --   --   --   --   --   --   BILITOT 1.1  0.7  --   --   --   --   --   --   PROT 6.1* 6.0*  --   --   --   --   --   --   ALBUMIN 2.5* 2.4*  < > 2.2* 2.3* 2.2* 2.0* 2.2*  < > = values in this interval not displayed. No results for input(s): LIPASE, AMYLASE in the last 168 hours.  Recent Labs Lab 06/05/16 0636  AMMONIA 18   Coagulation Profile: No results for input(s): INR, PROTIME in the last 168 hours. Cardiac Enzymes:  Recent Labs Lab 06/05/16 0636  TROPONINI 0.38*   BNP (last 3 results) No results for input(s): PROBNP in the last 8760 hours.  CBG:  Recent Labs Lab 06/08/16 2000 06/09/16 0430 06/09/16 0757 06/09/16 1200 06/09/16 1623  GLUCAP 112* 106* 90 118* 112*    Studies: Dg Chest Port 1 View  06/08/2016  CLINICAL DATA:  Acute onset of dysphagia. Assess for aspiration pneumonia. Initial encounter. EXAM: PORTABLE CHEST 1 VIEW COMPARISON:  Chest radiograph performed 06/07/2016 FINDINGS: Retrocardiac density is nonspecific and may reflect atelectasis or pneumonia. Associated left perihilar opacity is also seen. Would correlate with the patient's symptoms. No pleural effusion or pneumothorax is seen. The right lung appears clear. The cardiomediastinal silhouette is borderline enlarged. No acute osseous abnormalities are identified. IMPRESSION: 1. Persistent retrocardiac density. This is nonspecific, but may reflect atelectasis or pneumonia. Associated left perihilar opacity also noted. Would correlate with the patient's symptoms. 2. Borderline cardiomegaly. Electronically Signed   By: Garald Balding M.D.   On:  06/08/2016 23:50     Scheduled Meds: . antiseptic oral rinse  7 mL Mouth Rinse q12n4p  . antiseptic oral rinse  7 mL Mouth Rinse BID  . aspirin  81 mg Oral Daily  . cephALEXin  500 mg Oral Q12H  . fluconazole (DIFLUCAN) IV  100 mg Intravenous Q24H  . heparin  5,000 Units Subcutaneous Q8H  . ipratropium-albuterol  3 mL Nebulization BID  . megestrol  400 mg Oral Daily  . methimazole  5 mg Oral Daily    . potassium chloride  40 mEq Oral BID   Continuous Infusions:   PRN Meds: acetaminophen **OR** acetaminophen, albuterol, ALPRAZolam, ondansetron **OR** ondansetron (ZOFRAN) IV, RESOURCE THICKENUP CLEAR  Time spent: 30 minutes  Author: Berle Mull, MD [facility] Pager: [phone] 06/09/2016 5:29 PM  If 7PM-7AM, please contact night-coverage at [email or web address], password [facility]

## 2016-06-09 NOTE — Consult Note (Addendum)
  WOC wound consult note Reason for Consult: Deep tissue injury to sacrum with some epithelial sloughing noted.  Wound type:Deep tissue injury.  Albumin 2.4 and decreased appetite/intake Pressure Ulcer POA: Yes Measurement: 2 cm x 2 cm x 0.1 cm  Wound [id number] maroon discoloration Drainage (amount, consistency, odor) Scant serous weeping.   Periwound:nonblanchable erythema present Spoke with daughter, Particia Nearing, regarding at home care.  Alternating pressure by turning and repositioning and encouraging PO intake.   Dressing procedure/placement/frequency: Alternate pressure to sacrum.  Turn and reposition and rotate between bed and chair.   Allevyn silicone border foam dressing to sacrum . Bilateral Prevalon boots for home.  Patient moves legs a lot while in bed.  Floating heels with a pillow may not be successful.  Will not follow at this time.  Please re-consult if needed.  Domenic Moras RN BSN [place] Pager [phone]

## 2016-06-09 NOTE — Care Management Important Message (Signed)
Important Message  Patient Details  Name: ZAVION SABATINI MRN: [id number] Date of Birth: 08/13/1941   Medicare Important Message Given:  Yes    Camillo Flaming 06/09/2016, 10:07 AMImportant Message  Patient Details  Name: DONOVANN ORRICK MRN: [id number] Date of Birth: 03-Jun-1941   Medicare Important Message Given:  Yes    Camillo Flaming 06/09/2016, 10:06 AM

## 2016-06-10 LAB — GLUCOSE, CAPILLARY
GLUCOSE-CAPILLARY: 103 mg/dL — AB (ref 65–99)
Glucose-Capillary: 76 mg/dL (ref 65–99)
Glucose-Capillary: 75 mg/dL (ref 65–99)
Glucose-Capillary: 101 mg/dL — ABNORMAL HIGH (ref 65–99)

## 2016-06-10 MED ORDER — ALPRAZOLAM 0.25 MG PO TABS: 0.1250 mg | ORAL_TABLET | Freq: Two times a day (BID) | ORAL | Status: AC | PRN

## 2016-06-10 MED ORDER — POTASSIUM CHLORIDE CRYS ER 10 MEQ PO TBCR: 10.0000 meq | EXTENDED_RELEASE_TABLET | Freq: Every day | ORAL | Status: AC

## 2016-06-10 MED ORDER — METHIMAZOLE 5 MG PO TABS: 5.0000 mg | ORAL_TABLET | Freq: Every day | ORAL | Status: AC

## 2016-06-10 MED ORDER — MORPHINE SULFATE (CONCENTRATE) 10 MG /0.5 ML PO SOLN: 10.0000 mg | Freq: Four times a day (QID) | ORAL | Status: AC | PRN

## 2016-06-10 MED ORDER — FUROSEMIDE 20 MG PO TABS: 20.0000 mg | ORAL_TABLET | Freq: Every day | ORAL | Status: AC | PRN

## 2016-06-10 MED ORDER — MEGESTROL ACETATE 400 MG/10ML PO SUSP: 400.0000 mg | Freq: Every day | ORAL | Status: AC

## 2016-06-10 MED ORDER — CEPHALEXIN 250 MG/5ML PO SUSR: 500.0000 mg | Freq: Two times a day (BID) | ORAL | Status: AC

## 2016-06-10 MED ORDER — FLUCONAZOLE 40 MG/ML PO SUSR: 100.0000 mg | Freq: Every day | ORAL | Status: AC

## 2016-06-10 NOTE — Discharge Summary (Signed)
[facility] Discharge Summary   Patient: Bradley [name] [id number]   PCP: Mickie Hillier, MD DOB: 05/16/1941   Date of admission: 06/04/2016   Date of discharge: 06/10/2016     Discharge Diagnoses:  Principal Problem:   Acute renal failure superimposed on stage 3 chronic kidney disease (HCC) Active Problems:   Hypertension   UTI (lower urinary tract infection)   Chronic renal insufficiency, stage III (moderate)   Dyslipidemia   Abnormal TSH   Severe aortic stenosis   Elevated troponin I level   H/O: CVA  with Lt hemiparesis   Aspiration pneumonia (HCC)   Hypernatremia   Acute respiratory failure with hypoxia (HCC)   Pressure ulcer   Protein-calorie malnutrition, severe   HCAP (healthcare-associated pneumonia)   Admitted From: Home Disposition:  Home with hospice  Recommendations for Outpatient Follow-up:  1. Please follow-up with hospice in [facility]   Follow-up Information    Follow up with Hospice at [facility].   Specialty:  Hospice and Palliative Medicine   Why:  Home nurse.   Contact information:   [address] [place] [address] [phone]      Diet recommendation: Dysphagia type I diet  Activity: The patient is advised to gradually reintroduce usual activities.  Discharge Condition: Stable  Code Status: DNR/DNI, home with hospice  History of present illness: As per the H and P dictated on admission, "Bradley [name] is a 75 y.o. male with medical history significant of recent admitted for pneumonia was brought to the ER after patient became lethargic and decreased response. Patient's family states that since discharge patient has become progressively weak and over the last 2 days has hardly eaten anything but did take his medications yesterday. Today patient became very lethargic with hardly any response and patient was brought to the ER. Initially patient was mildly hypotensive and was given fluid bolus. Labs showing acute renal failure  with creatinine around 4 with normal creatinine just 4 days ago. Patient's sodium is also around 161. After admission patient is becoming more responsive and following commands. Patient is also in respiratory failure requiring BiPAP. Chest x-ray shows stable infiltrates. Patient's family at this time as requested no intubation. Patient has history of stroke with difficulty speaking. As per the family patient has become more bedbound recently. Previously used to use a walker. Patient also has severe aortic stenosis and cardiology has felt patient is not a candidate for intervention due to poor functional capacity."  Hospital Course:  Summary of his active problems in the hospital is as following. 1. Acute renal failure superimposed on stage 3 chronic kidney disease (HCC)  hypernatremia. Patient presents with serum creatinine of 4.3 as well as sodium of 161. Likely prerenal since the patient has been having poor oral intake for last 3 days with continued use of Lasix. Unremarkable renal ultrasound. Significantly improved with IV hydration, Continue Foley catheter. Nephrology consulted. Appreciate input.  2. suspected sepsis with UTI as well as healthcare associated pneumonia  Presented with tachycardia, lactic acidosis, acute respiratory failure with requiring BiPAP. Initially treated with cefepime Monitor for culture.  Chest x-ray shows evidence of advance infiltrate on the left side progressive from admission. MRSA PCR negative. Would change antibiotics to Keflex Urine is also growing yeast we'll treat with fluconazole.  3.abnormal TSH. Thyromegaly. 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 TSH is significantly low. Elevated free T4. Continue methimazole.  4.pressure ulcer. Protein calorie malnutrition likely severe. Nutritional consult. Speech consult for swallowing recommends dysphagia type I  diet.  5.goals of care discussion.  severe aortic stenosis. CVA. Chronic  dysphagia. I discussed on 06/08/2016 with daughter regarding goals of care, given patient's limited oral intake as well as recurrent aspiration and severe aortic stenosis family agreed to discuss with hospice regarding discharge planning with home with hospice. Social worker and case management consult placed.  6. History of CVA. Resume aspirin.  All other chronic medical condition were stable during the hospitalization.  Patient was seen by physical therapy, at the end the plan was for discharge home with hospice and hospice was arranged by social worker and case Freight forwarder. On the day of the discharge the patient's symptoms are well controlled, and no other acute medical condition were reported by patient. the patient was felt safe to be discharge at home with hospice.  Procedures and Results:  None   Consultations:  Nephrology  Palliative care  DISCHARGE MEDICATION: Discharge Medication List as of 06/10/2016  4:27 PM    START taking these medications   Details  cephALEXin (KEFLEX) 250 MG/5ML suspension Take 10 mLs (500 mg total) by mouth every 12 (twelve) hours., Starting 06/10/2016, Until Discontinued, Normal    fluconazole (DIFLUCAN) 40 MG/ML suspension Take 2.5 mLs (100 mg total) by mouth daily., Starting 06/10/2016, Until Wed 06/18/16, Normal    megestrol (MEGACE) 400 MG/10ML suspension Take 10 mLs (400 mg total) by mouth daily., Starting 06/10/2016, Until Discontinued, Normal    methimazole (TAPAZOLE) 5 MG tablet Take 1 tablet (5 mg total) by mouth daily., Starting 06/10/2016, Until Discontinued, Normal    Morphine Sulfate (MORPHINE CONCENTRATE) 10 mg / 0.5 ml concentrated solution Take 0.5 mLs (10 mg total) by mouth every 6 (six) hours as needed for severe pain or shortness of breath., Starting 06/10/2016, Until Discontinued, Print    potassium chloride (K-DUR,KLOR-CON) 10 MEQ tablet Take 1 tablet (10 mEq total) by mouth daily., Starting 06/10/2016, Until Discontinued, Normal       CONTINUE these medications which have CHANGED   Details  ALPRAZolam (XANAX) 0.25 MG tablet Take 0.5-1 tablets (0.125-0.25 mg total) by mouth 2 (two) times daily as needed for anxiety., Starting 06/10/2016, Until Discontinued, Print    furosemide (LASIX) 20 MG tablet Take 1 tablet (20 mg total) by mouth daily as needed for fluid or edema., Starting 06/10/2016, Until Discontinued, Normal      CONTINUE these medications which have NOT CHANGED   Details  albuterol (PROVENTIL) (2.5 MG/3ML) 0.083% nebulizer solution Take 3 mLs (2.5 mg total) by nebulization every 4 (four) hours as needed for wheezing or shortness of breath., Starting 04/02/2015, Until Discontinued, Print    aspirin 81 MG tablet Take 81 mg by mouth daily., Until Discontinued, Historical Med    atorvastatin (LIPITOR) 10 MG tablet Take 10 mg by mouth daily., Until Discontinued, Historical Med    benzonatate (TESSALON) 100 MG capsule Take 1 capsule (100 mg total) by mouth every 8 (eight) hours., Starting 04/18/2016, Until Discontinued, Print    cetirizine (ZYRTEC ALLERGY) 10 MG tablet Take 1 tablet (10 mg total) by mouth daily., Starting 04/18/2016, Until Discontinued, Print    docusate sodium (COLACE) 100 MG capsule Take 1 capsule (100 mg total) by mouth every 12 (twelve) hours., Starting 06/19/2015, Until Discontinued, Print    ipratropium-albuterol (DUONEB) 0.5-2.5 (3) MG/3ML SOLN Take 3 mLs by nebulization 2 (two) times daily., Starting 04/05/2015, Until Discontinued, No Print    Maltodextrin-Xanthan Gum ([place]) POWD With every meal, No Print    pramoxine (PROCTOFOAM) 1 % foam  Place 1 application rectally 3 (three) times daily as needed for itching., Starting 06/19/2015, Until Discontinued, Print      STOP taking these medications     amLODipine (NORVASC) 10 MG tablet      hydrALAZINE (APRESOLINE) 100 MG tablet      labetalol (NORMODYNE) 300 MG tablet      levofloxacin (LEVAQUIN) 500 MG tablet        No  Known Allergies Discharge Instructions    DIET - DYS 1    Complete by:  As directed   Fluid consistency:  Honey Thick     Diet general    Complete by:  As directed      Increase activity slowly    Complete by:  As directed           Discharge Exam: Filed Weights   06/06/16 1210 06/09/16 0448 06/10/16 0530  Weight: 42.7 kg (94 lb 2.2 oz) 41.4 kg (91 lb 4.3 oz) 43 kg (94 lb 12.8 oz)   Filed Vitals:   06/10/16 0530 06/10/16 1349  BP: 146/70 148/73  Pulse: 67 70  Temp: 98 F (36.7 C) 97.9 F (36.6 C)  Resp: 24 22   General: Appear in mild distress, no Rash; Oral Mucosa moist. Cardiovascular: S1 and S2 Present, aortic systolic Murmur, no JVD Respiratory: Bilateral Air entry present and Clear to Auscultation, no Crackles, no wheezes Abdomen: Bowel Sound present, Soft and no tenderness Extremities: no Pedal edema, no calf tenderness Neurology: Grossly no focal neuro deficit.  The results of significant diagnostics from this hospitalization (including imaging, microbiology, ancillary and laboratory) are listed below for reference.    Significant Diagnostic Studies: X-ray Chest Pa And Lateral  05/30/2016  CLINICAL DATA:  Pneumonia. EXAM: CHEST  2 VIEW COMPARISON:  05/29/2016 . FINDINGS: Mediastinum and hilar structures are normal. Cardiomegaly. Left mid and lower lung infiltrates consistent pneumonia is again noted. Small left pleural effusion. No pneumothorax. IMPRESSION: 1. Cardiomegaly. 2. Left mid and lower lung lobe infiltrates consistent with pneumonia. No interim change from prior exam. Small left pleural effusion. Electronically Signed   By: Marcello Moores  Register   On: 05/30/2016 13:08   Dg Chest 2 View  05/29/2016  CLINICAL DATA:  Patient with shortness of breath and history of CHF. EXAM: CHEST  2 VIEW COMPARISON:  Chest radiograph 05/27/2016 FINDINGS: Interval worsening left mid and lower lung pulmonary consolidation. Probable small left pleural effusion. Stable enlarged cardiac and  mediastinal contours. No pneumothorax. Thoracic spine degenerative changes. IMPRESSION: Interval worsening left mid lower lung pulmonary consolidation concerning for pneumonia. Followup PA and lateral chest X-ray is recommended in 3-4 weeks following trial of antibiotic therapy to ensure resolution and exclude underlying malignancy. Electronically Signed   By: Lovey Newcomer M.D.   On: 05/29/2016 09:29   Dg Chest 2 View  05/27/2016  CLINICAL DATA:  Worsening cough EXAM: CHEST  2 VIEW COMPARISON:  04/18/2016 FINDINGS: Cardiac shadow is stable. The lungs are well aerated bilaterally. New left mid and lower lung infiltrate is noted. No bony abnormality is noted. IMPRESSION: New left mid and lower lung infiltrate. Electronically Signed   By: Inez Catalina M.D.   On: 05/27/2016 07:54   US Renal  06/05/2016  ADDENDUM REPORT: 06/05/2016 16:54 IMPRESSION: IMPRESSION 1. No hydronephrosis. 2. Bilateral echogenic kidneys compatible with chronic medical renal disease. 3. Mildly complex cyst within the left kidney containing internal low level echoes. Consider further evaluation with nonemergent MRI of kidneys Electronically Signed   By: Lovena Le  Clovis Riley M.D.   On: 06/05/2016 16:54  06/05/2016  CLINICAL DATA:  Acute kidney injury. EXAM: RENAL / URINARY TRACT ULTRASOUND COMPLETE COMPARISON:  02/28/2011 FINDINGS: Right Kidney: Length: 10.7 cm. Increased parenchymal echogenicity. No hydronephrosis. Two cyst within the mid pole are noted. The larger measures 1.6 x 1.3 x 1.0 cm. Left Kidney: Length: 10.6 cm. Increased parenchymal echogenicity. No hydronephrosis. There are several cysts which appear mildly complex containing low level internal echoes. The largest measures 1.2 x 0.8 x 1.1 cm. Bladder: Partially collapsed around a Foley catheter balloon. Debris identified within the urinary bladder. IMPRESSION: 1. No hydronephrosis. 2. Bilateral echogenic kidneys compatible with chronic medical renal disease. 3. Mildly complex cyst  within the left ovary containing internal low level echoes. Consider further evaluation with nonemergent MRI of the kidneys. Electronically Signed: By: Kerby Moors M.D. On: 06/05/2016 10:16   Dg Chest Port 1 View  06/08/2016  CLINICAL DATA:  Acute onset of dysphagia. Assess for aspiration pneumonia. Initial encounter. EXAM: PORTABLE CHEST 1 VIEW COMPARISON:  Chest radiograph performed 06/07/2016 FINDINGS: Retrocardiac density is nonspecific and may reflect atelectasis or pneumonia. Associated left perihilar opacity is also seen. Would correlate with the patient's symptoms. No pleural effusion or pneumothorax is seen. The right lung appears clear. The cardiomediastinal silhouette is borderline enlarged. No acute osseous abnormalities are identified. IMPRESSION: 1. Persistent retrocardiac density. This is nonspecific, but may reflect atelectasis or pneumonia. Associated left perihilar opacity also noted. Would correlate with the patient's symptoms. 2. Borderline cardiomegaly. Electronically Signed   By: Garald Balding M.D.   On: 06/08/2016 23:50   Dg Chest Port 1 View  06/07/2016  CLINICAL DATA:  75 year old male with a history of shortness of breath, hypertension EXAM: PORTABLE CHEST 1 VIEW COMPARISON:  06/04/2016, 05/30/2016 FINDINGS: Cardiomediastinal silhouette partially obscured by a left rotation of the patient. Cardiomegaly persists. Increasing opacity at the left base with obscuration of the retrocardiac region in the left hemidiaphragm. Soft tissues obscure evaluation of the left apex. Right lung relatively well aerated. Calcifications of the aortic arch. Ill-defined interstitial opacity in the left mid lung. IMPRESSION: Aeration maintained on the right. Opacity at the left base appears to be worsening, which may be secondary to the positioning of the patient, or worsening consolidation, volume loss, and/ or pleural fluid. Signed, Dulcy Fanny. Earleen Newport, DO [facility]  [facility] Electronically Signed   By: Corrie Mckusick D.O.   On: 06/07/2016 10:20   Dg Chest Port 1 View  06/04/2016  CLINICAL DATA:  Increasing shortness of Breath EXAM: PORTABLE CHEST 1 VIEW COMPARISON:  05/30/16 FINDINGS: Cardiac shadow is stable. The lungs are well aerated bilaterally. Persistent left basilar infiltrate is seen and stable from the prior study. No focal infiltrate is seen. No bony abnormality is noted. IMPRESSION: Stable left basilar infiltrate. Electronically Signed   By: Inez Catalina M.D.   On: 06/04/2016 19:50    Microbiology: Recent Results (from the past 240 hour(s))  Blood Culture (routine x 2)     Status: None   Collection Time: 06/04/16  7:00 PM  Result Value Ref Range Status   Specimen Description BLOOD RIGHT ARM  Final   Special Requests BOTTLES DRAWN AEROBIC AND ANAEROBIC 5 ML  Final   Culture   Final    NO GROWTH 5 DAYS Performed at [facility] [address]    Report Status 06/09/2016 FINAL  Final  Blood Culture (routine x 2)     Status: None   Collection Time:  06/04/16  8:15 PM  Result Value Ref Range Status   Specimen Description BLOOD LEFT HAND  Final   Special Requests BOTTLES DRAWN AEROBIC AND ANAEROBIC 5CC  Final   Culture   Final    NO GROWTH 5 DAYS Performed at [facility]    Report Status 06/09/2016 FINAL  Final  Urine culture     Status: Abnormal   Collection Time: 06/04/16  8:42 PM  Result Value Ref Range Status   Specimen Description URINE, RANDOM  Final   Special Requests NONE  Final   Culture >=100,000 COLONIES/mL YEAST (A)  Final   Report Status 06/06/2016 FINAL  Final  MRSA PCR Screening     Status: None   Collection Time: 06/04/16 10:52 PM  Result Value Ref Range Status   MRSA by PCR NEGATIVE NEGATIVE Final    Comment:        The GeneXpert MRSA Assay (FDA approved for NASAL specimens only), is one component of a comprehensive MRSA colonization surveillance program. It is not intended to diagnose MRSA infection nor  to guide or monitor treatment for MRSA infections.      Labs: CBC:  Recent Labs Lab 06/04/16 1902 06/05/16 0109 06/06/16 0120 06/08/16 0557  WBC 12.5* 11.7* 9.3 9.0  NEUTROABS 11.1*  --  7.9*  --   HGB 9.9* 10.5* 9.3* 9.0*  HCT 30.8* 33.4* 28.9* 27.6*  MCV 84.8 88.1 83.5 84.1  PLT 120* 113* 93* 95*   Basic Metabolic Panel:  Recent Labs Lab 06/05/16 0122  06/05/16 1441  06/06/16 0120  06/06/16 2114 06/07/16 0054 06/07/16 0535 06/08/16 0557 06/09/16 0452  NA  --   < > 156*  < > 154*  < > 152* 152* 151* 151* 150*  150*  K  --   < > 3.2*  < > 3.7  < > 4.0 3.3* 3.2* 3.5 2.8*  2.7*  CL  --   < > 123*  < > 124*  < > 125* 123* 121* 122* 120*  120*  CO2  --   < > 23  < > 22  < > 18* 22 22 22 24  24   GLUCOSE  --   < > 132*  < > 139*  < > 118* 113* 118* 100* 108*  109*  BUN  --   < > 105*  < > 105*  < > 82* 81* 73* 62* 50*  51*  CREATININE  --   < > 4.13*  < > 3.85*  < > 2.95* 2.79* 2.62* 1.87* 1.47*  1.52*  CALCIUM  --   < > 10.3  < > 9.9  < > 9.6 9.6 9.3 9.0 9.2  9.2  MG 2.2  --  2.1  --  1.9  --   --   --   --  1.7  --   PHOS  --   --   --   --   --   < > 3.5 3.2 3.1 3.2 2.8  < > = values in this interval not displayed. Liver Function Tests:  Recent Labs Lab 06/04/16 1902 06/06/16 0120  06/06/16 2114 06/07/16 0054 06/07/16 0535 06/08/16 0557 06/09/16 0452  AST 47* 44*  --   --   --   --   --   --   ALT 30 30  --   --   --   --   --   --   ALKPHOS 64 63  --   --   --   --   --   --  BILITOT 1.1 0.7  --   --   --   --   --   --   PROT 6.1* 6.0*  --   --   --   --   --   --   ALBUMIN 2.5* 2.4*  < > 2.2* 2.3* 2.2* 2.0* 2.2*  < > = values in this interval not displayed. No results for input(s): LIPASE, AMYLASE in the last 168 hours.  Recent Labs Lab 06/05/16 0636  AMMONIA 18   Cardiac Enzymes:  Recent Labs Lab 06/05/16 0636  TROPONINI 0.38*   BNP (last 3 results)  Recent Labs  05/29/16 1007 05/30/16 0556 05/31/16 0611  BNP >4500.0*  >4500.0* 3650.2*   CBG:  Recent Labs Lab 06/09/16 1623 06/09/16 2034 06/10/16 0537 06/10/16 0750 06/10/16 1148  GLUCAP 112* 99 76 75 101*   Time spent: 30 minutes  Signed:  Landy Mace  [facility] 06/10/2016 , 5:34 PM

## 2016-06-10 NOTE — Care Management Note (Signed)
Case Management Note  Patient Details  Name: SAJED BRIDENBAUGH MRN: [id number] Date of Birth: [birth year]  Subjective/Objective: Per [facility] liason Sue-dtr [name] agreed to [facility]-forms dnr out of facility on shadow chart.Confirmed address.Scripts on shadow chart for family to fill. Nurse will contact [facility] for transp.                   Action/Plan:d/c home w/[facility].   Expected Discharge Date:   (unknown)               Expected Discharge Plan:  Home w Hospice Care  In-House Referral:  NA  Discharge planning Services  CM Consult  Post Acute Care Choice:  NA Choice offered to:  Adult Children  DME Arranged:    DME Agency:     HH Arranged:  RN [place] Agency:  [facility]  Status of Service:  Completed, signed off  Medicare Important Message Given:  Yes Date Medicare IM Given:    Medicare IM give by:    Date Additional Medicare IM Given:    Additional Medicare Important Message give by:     If discussed at [place] of Stay Meetings, dates discussed:    Additional Comments:  Dessa Phi, RN 06/10/2016, 4:06 PM

## 2016-06-10 NOTE — Consult Note (Signed)
   [facility] CM Inpatient Consult   06/10/2016  Bradley [name] Nov 27, 1941 [id number]    Patient screened for potential [facility] Care Management services. Chart reviewed. Noted discharge plan is for home with hospice at this time.  There are no identifiable [facility] Care Management needs at this time. If patient's post hospital needs change, please place a [facility] Care Management consult. For questions please contact:  Marthenia Rolling, [place], RN,BSN [facility] Liaison [phone]

## 2016-06-10 NOTE — Progress Notes (Signed)
Nutrition Follow-up  DOCUMENTATION CODES:   Severe malnutrition in context of chronic illness, Underweight  INTERVENTION:  - Continue Dysphagia 1, honey-thick liquids. - RD will continue to monitor for needs prior to d/c.  NUTRITION DIAGNOSIS:   Malnutrition related to chronic illness as evidenced by severe depletion of muscle mass, severe depletion of body fat, energy intake < or equal to 50% for > or equal to 5 days. -ongoing  GOAL:   Patient will meet greater than or equal to 90% of their needs -unmet  MONITOR:   PO intake, Weight trends, Labs, Skin, I & O's  ASSESSMENT:   75 y.o. male with medical history significant of recent admitted for pneumonia was brought to the ER after patient became lethargic and decreased response. Patient's family states that since discharge patient has become progressively weak and over the last 2 days has hardly eaten anything but did take his medications yesterday. Today patient became very lethargic with hardly any response and patient was brought to the ER. Initially patient was mildly hypotensive and was given fluid bolus. Labs showing acute renal failure with creatinine around 4 with normal creatinine just 4 days ago. Patient's sodium is also around 161. After admission patient is becoming more responsive and following commands. Patient is also in respiratory failure requiring BiPAP. Chest x-ray shows stable infiltrates. Patient's family at this time as requested no intubation. Patient has history of stroke with difficulty speaking. As per the family patient has become more bedbound recently. Previously used to use a walker. Patient also has severe aortic stenosis and cardiology has felt patient is not a candidate for intervention due to poor functional capacity.  6/13 Per chart review, pt consumed 25% of breakfast this AM, 50% of breakfast and lunch 6/10. Per notes, plan to d/c home with hospice and no plan/desire per POA for nutrition support. Per  rounds this AM, hospice representative in talking with family at this time and pt is possibly to be d/c'ed today.   Pt not meeting needs. Medications reviewed; 40 mEq oral KCl BID. Labs reviewed; CBGs: 75-118 mg/dL this AM, Na: 150 mmol/L, K: 2.8 mmol/L, Cl: 120 mmol/L, BUN/creatinine elevated but trending down, GFR: 52.    6/9 - No intakes documented since admission.  - Spoke with niece who is POA who is at bedside at this time.  - She states that pt was provided with Dysphagia 1, honey-thick liquids at home for several months PTA.  - Pt previously had a very good appetite, but since admission at [facility] for PNA he has had a decrease in appetite and intakes.  - Niece states that pt has had increasing difficulty with swallowing and that feeding is very stressful for her and pt due to limited ability to swallow.  - Pt being followed by SLP at this time.  - Palliative Care visited pt's room 6/8; will continue to monitor for decisions regarding POC/GOC.  - Unable to perform physical assessment due to tech and nephrologist working with pt but pt is very cachectic in appearance and RD assessment 05/30/16 states severe muscle and fat wasting to upper and lower body.  - Niece states pt unable to eat/drin for 4 days PTA (admitted 06/04/16).  - Niece states that prior to admission at [facility] pt weighed 118-120 lbs which is consistent with weights in chart.  - Per review, pt has lost 27 lbs (23% body weight) in the past 1.5-2 months which is significant for time frame. - If TF within GOC/POC/wishes  of family moving forward, recommend Jevity 1.2 @ 45 mL/hr via NGT versus PEG.    Diet Order:  DIET - DYS 1 Room service appropriate?: Yes; Fluid consistency:: Honey Thick  Skin:  Wound (see comment) (Stage 2 sacral pressure injury)  Last BM:  6/12  Height:   Ht Readings from Last 1 Encounters:  06/06/16 5\' 7"  (1.702 m)    Weight:   Wt Readings from Last 1 Encounters:  06/10/16 94 lb 12.8 oz (43 kg)     Ideal Body Weight:  67.27 kg (kg)  BMI:  Body mass index is 14.84 kg/(m^2).  Estimated Nutritional Needs:   Kcal:  1150-1350  Protein:  50-60 grams  Fluid:  >/= 1.3 L/day  EDUCATION NEEDS:   No education needs identified at this time     Jarome Matin, MS, RD, LDN Inpatient Clinical Dietitian Pager # [phone] After hours/weekend pager # [phone]

## 2016-06-10 NOTE — Care Management Note (Signed)
Case Management Note  Patient Details  Name: KEELER MCWAIN MRN: [id number] Date of Birth: 10-23-1941  Subjective/Objective:  Dtr [name] chose [facility]-liason Collie Siad has accepted case-She has spoken to [employer] about their service, she plans to still transport home on own but aware that [facility]-non emergency ambulance service is available if needed.                   Action/Plan:d/c home w/Hospice-[facility].   Expected Discharge Date:   (unknown)               Expected Discharge Plan:  Home w Hospice Care  In-House Referral:  NA  Discharge planning Services  CM Consult  Post Acute Care Choice:  NA Choice offered to:  Adult Children  DME Arranged:    DME Agency:     HH Arranged:    HH Agency:     Status of Service:  In process, will continue to follow  Medicare Important Message Given:  Yes Date Medicare IM Given:    Medicare IM give by:    Date Additional Medicare IM Given:    Additional Medicare Important Message give by:     If discussed at [place] of Stay Meetings, dates discussed:    Additional Comments:  Dessa Phi, RN 06/10/2016, 12:28 PM

## 2016-06-10 NOTE — Progress Notes (Addendum)
Notified by Dessa Phi,  CMRN of family request for Hospice and [facility] services at home after discharge. Chart and patient information currently under review to confirm hospice eligibility.   Spoke with the daughter [name]  By telephone  to initiate education related to hospice philosophy, services and team approach to care. Family verbalized understanding of the information provided. Per discussion plan is for discharge to home by personal vehicle with the daughter's spouse later today.    I did discuss with [name] that if she changed her mind regarding mode of transport she can let myself or Dessa Phi know.    Please send signed completed DNR form home with patient.   Patient will need prescriptions for discharge comfort medications.   DME needs discussed.  Daughter declined a hospital bed and oxygen, but she would like a w/c and suction machine. Nebulizer machine was also ordered.        [facility] equipment manager [name] Ysidro Evert notified and will contact [place] to arrange delivery to the home.  The home address has been verified and is correct in the chart.  Daughter Particia Nearing is the family member to be contacted to arrange time of delivery.  [facility] aware of the above.  Completed discharge summary will need to be faxed to [facility] at [phone] when final.  Please notify [facility] when patient is ready to leave unit at discharge-call [phone].   [facility] information and contact numbers have been given to [facility] during visit.  Above information shared with Dessa Phi, CMRN.  Please call with any questions.  Mickie Kay, [facility]  [phone]

## 2016-07-29 DEATH — deceased

## 2017-07-12 IMAGING — DX DG CHEST 2V
2 series · 2 of 2 positions shown · non-contrast
Comparison: 04/18/2016

CLINICAL DATA: Worsening cough

EXAM:
CHEST  2 VIEW

[chest lat]
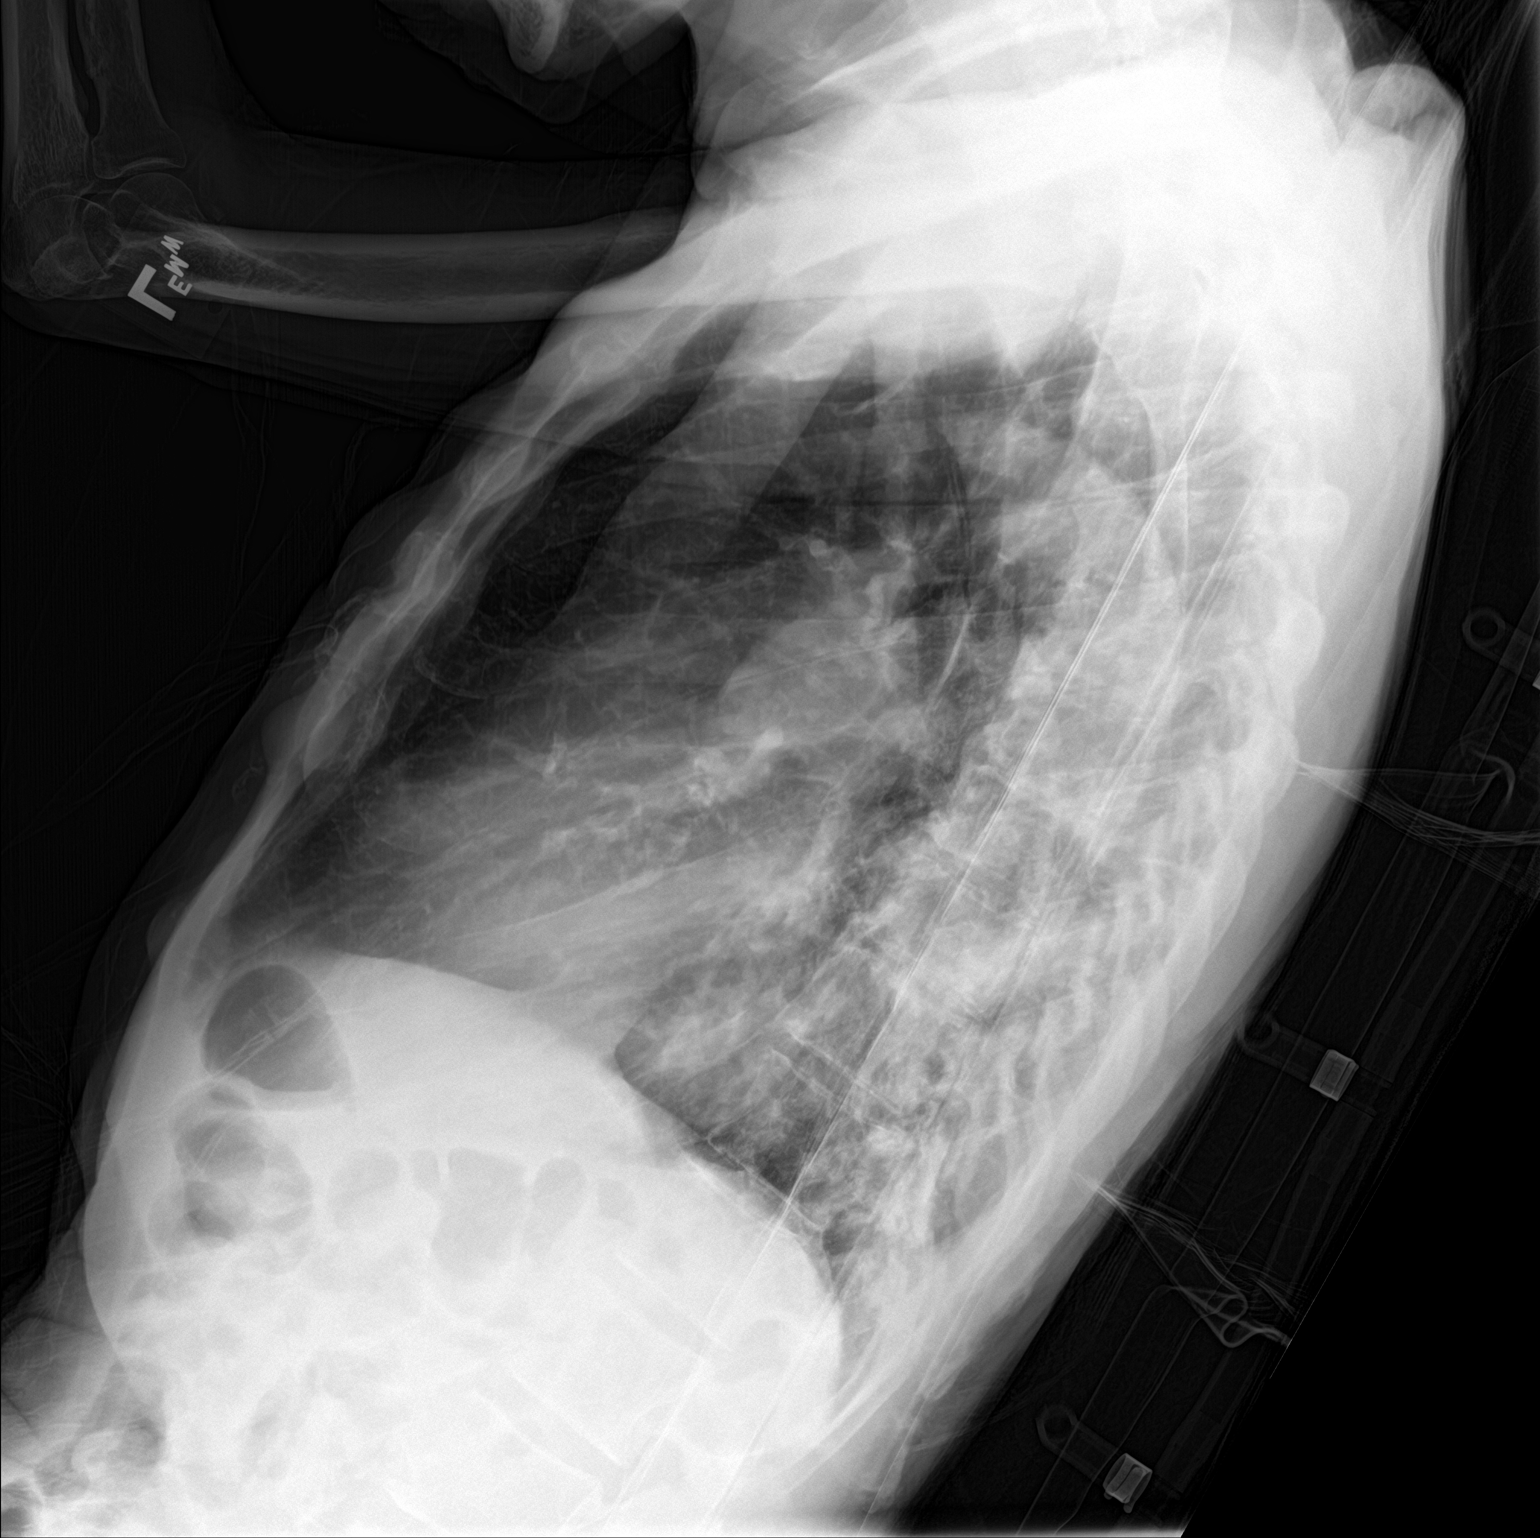

[chest ap]
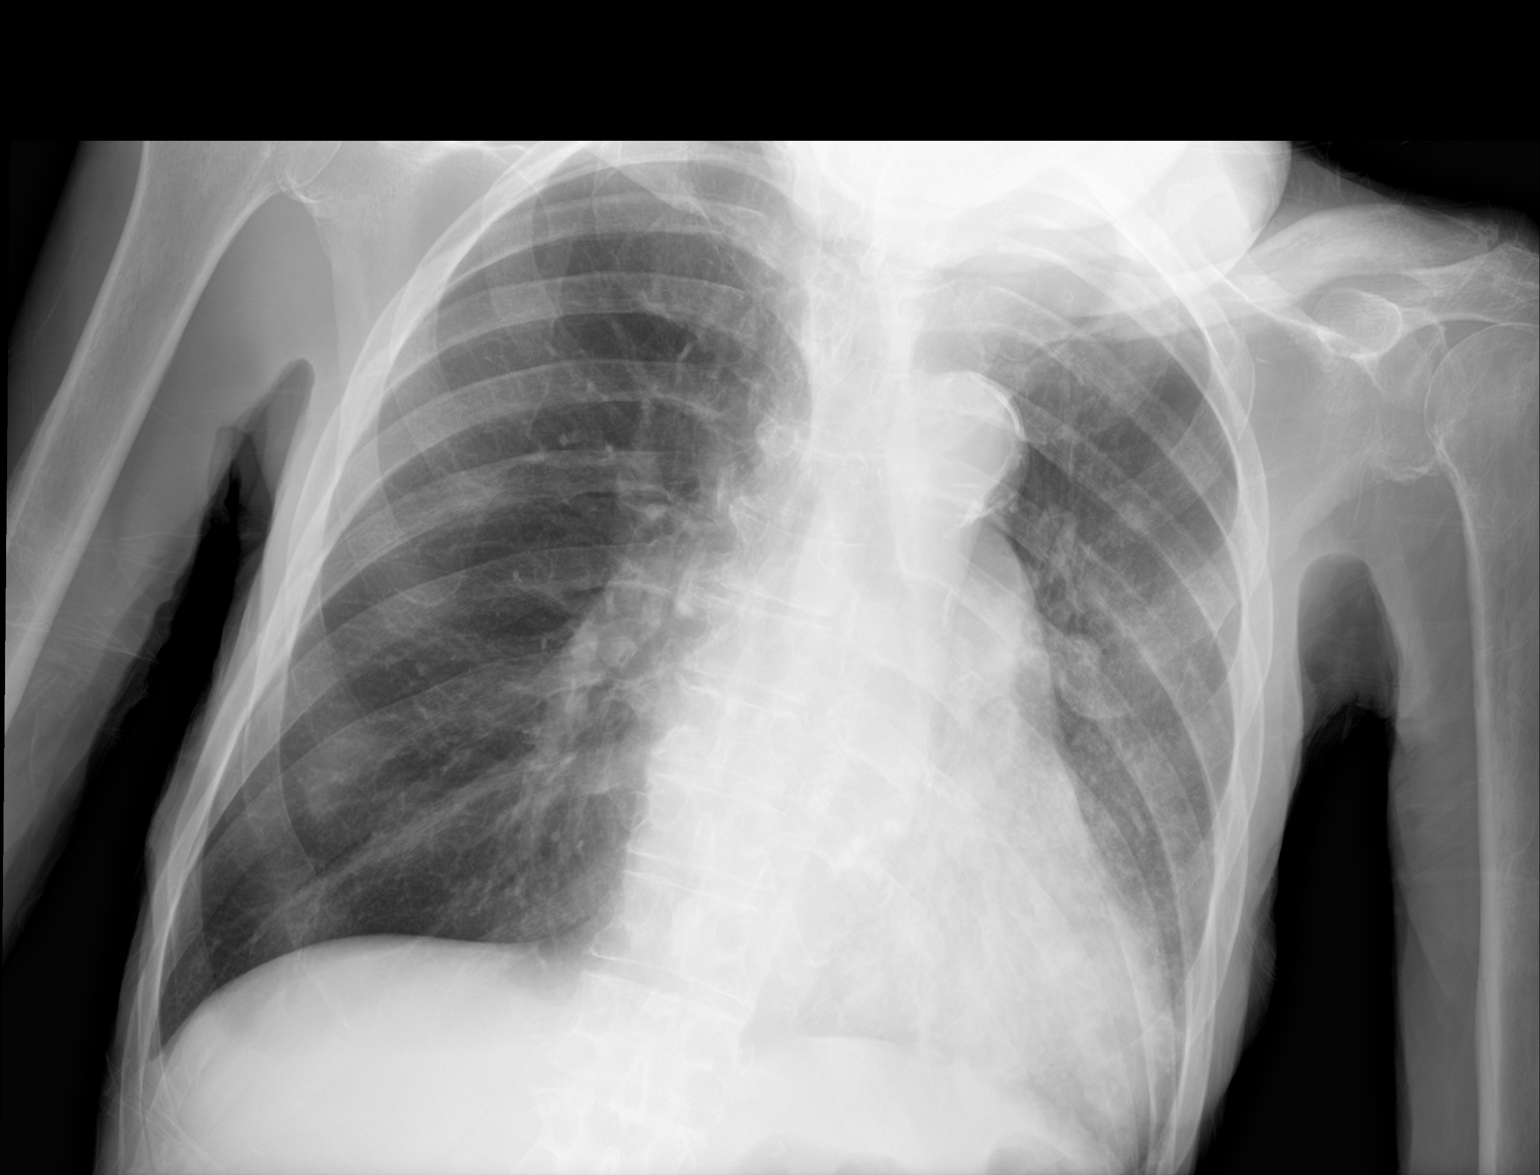

[2 of 2 positions shown; findings below may reference images not displayed]

FINDINGS: Cardiac shadow is stable. The lungs are well aerated bilaterally.
New left mid and lower lung infiltrate is noted. No bony abnormality
is noted.
IMPRESSION: New left mid and lower lung infiltrate.

## 2017-07-14 IMAGING — CR DG CHEST 2V
2 series · 2 of 2 positions shown · non-contrast
Comparison: Chest radiograph 05/27/2016

CLINICAL DATA: Patient with shortness of breath and history of CHF.

EXAM:
CHEST  2 VIEW

[chest pa]
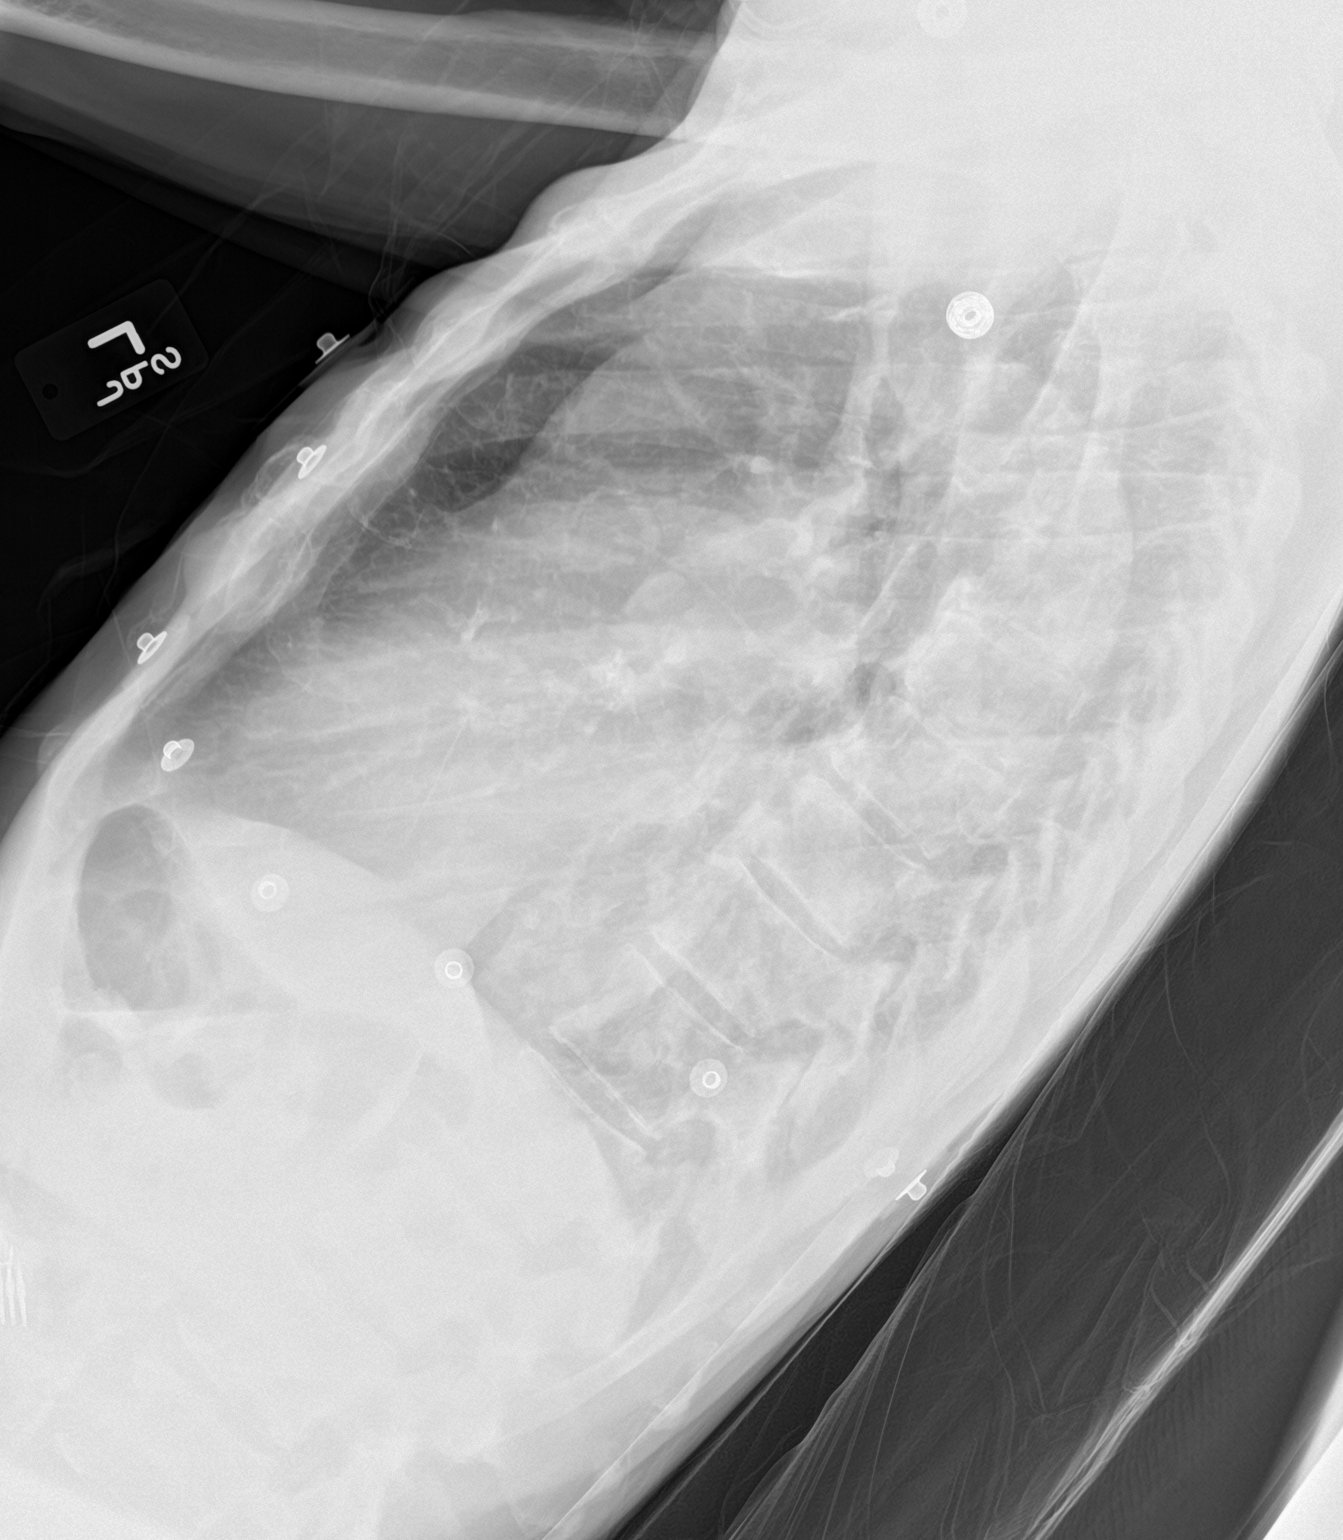

[chest ap]
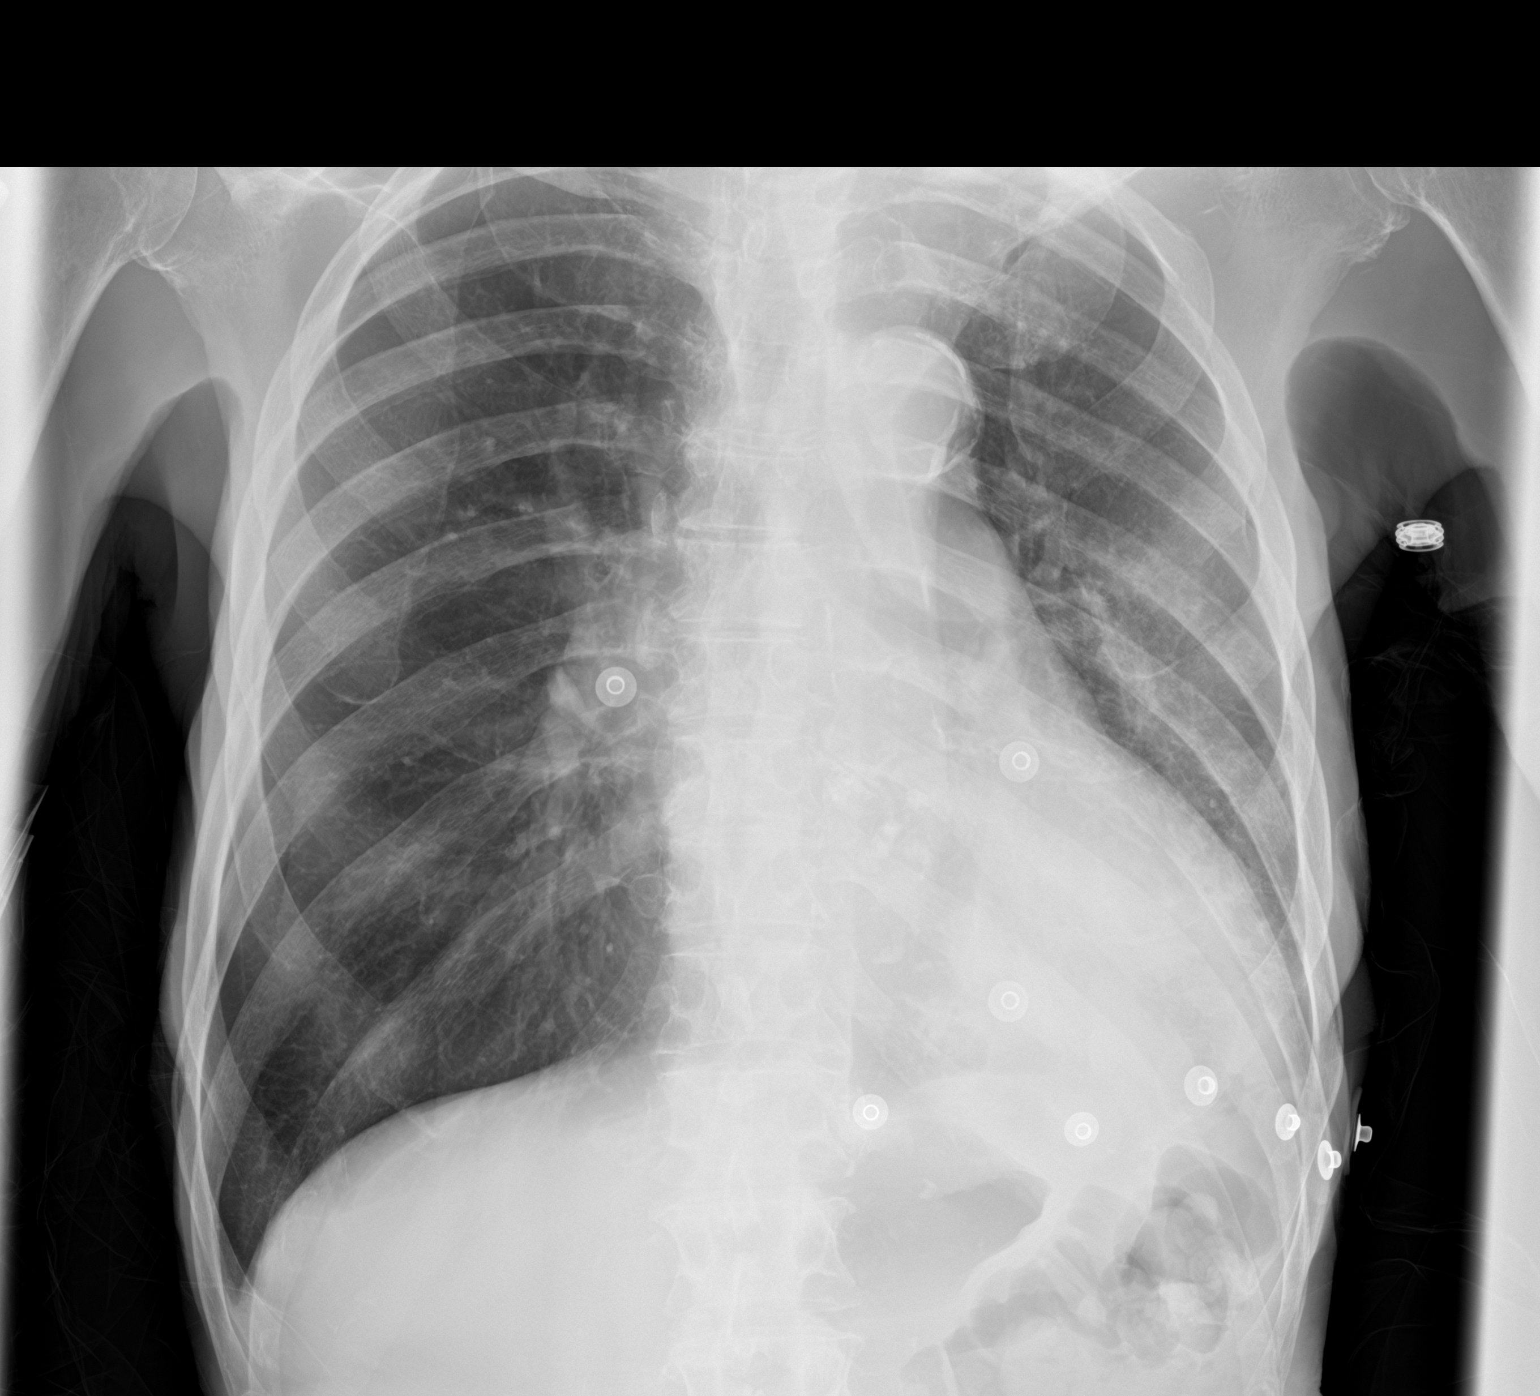

[2 of 2 positions shown; findings below may reference images not displayed]

FINDINGS: Interval worsening left mid and lower lung pulmonary consolidation.
Probable small left pleural effusion. Stable enlarged cardiac and
mediastinal contours. No pneumothorax. Thoracic spine degenerative
changes.
IMPRESSION: Interval worsening left mid lower lung pulmonary consolidation
concerning for pneumonia. Followup PA and lateral chest X-ray is
recommended in 3-4 weeks following trial of antibiotic therapy to
ensure resolution and exclude underlying malignancy.

## 2017-07-20 IMAGING — DX DG CHEST 1V PORT
1 series · 1 of 1 positions shown · non-contrast
Comparison: 05/30/16

CLINICAL DATA: Increasing shortness of Breath

EXAM:
PORTABLE CHEST 1 VIEW

[chest ap]
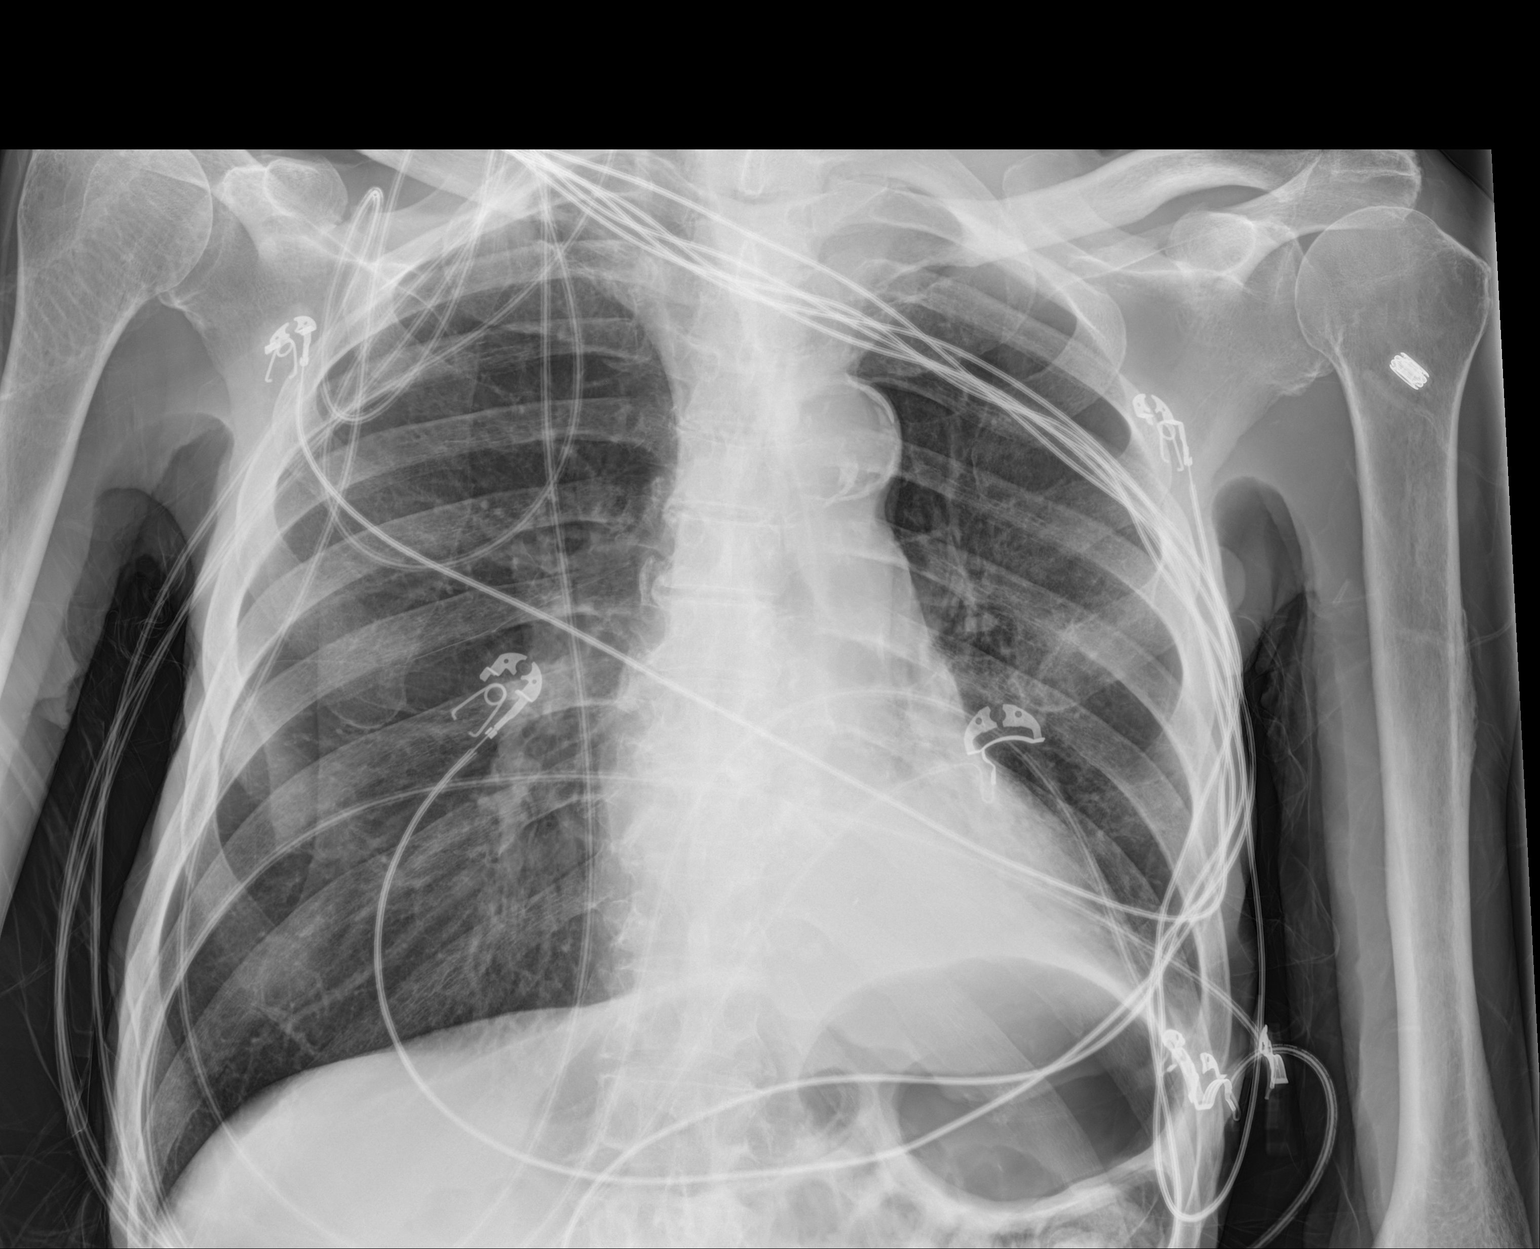

[1 of 1 positions shown; findings below may reference images not displayed]

FINDINGS: Cardiac shadow is stable. The lungs are well aerated bilaterally.
Persistent left basilar infiltrate is seen and stable from the prior
study. No focal infiltrate is seen. No bony abnormality is noted.
IMPRESSION: Stable left basilar infiltrate.
